# Patient Record
Sex: Male | Born: 1945 | Race: White | Hispanic: No | Marital: Married | Smoking: Never smoker
Health system: Southern US, Community
[De-identification: ages and names within clinical notes are randomized; demographics above are authoritative.]

## PROBLEM LIST (undated history)

## (undated) DIAGNOSIS — G589 Mononeuropathy, unspecified: Secondary | ICD-10-CM

## (undated) DIAGNOSIS — M1990 Unspecified osteoarthritis, unspecified site: Secondary | ICD-10-CM

## (undated) DIAGNOSIS — N183 Chronic kidney disease, stage 3 unspecified: Secondary | ICD-10-CM

## (undated) DIAGNOSIS — E119 Type 2 diabetes mellitus without complications: Secondary | ICD-10-CM

## (undated) DIAGNOSIS — N400 Enlarged prostate without lower urinary tract symptoms: Secondary | ICD-10-CM

## (undated) DIAGNOSIS — E785 Hyperlipidemia, unspecified: Secondary | ICD-10-CM

## (undated) DIAGNOSIS — S149XXA Injury of unspecified nerves of neck, initial encounter: Secondary | ICD-10-CM

## (undated) DIAGNOSIS — H269 Unspecified cataract: Secondary | ICD-10-CM

## (undated) DIAGNOSIS — K219 Gastro-esophageal reflux disease without esophagitis: Secondary | ICD-10-CM

## (undated) DIAGNOSIS — I2510 Atherosclerotic heart disease of native coronary artery without angina pectoris: Secondary | ICD-10-CM

## (undated) DIAGNOSIS — I10 Essential (primary) hypertension: Secondary | ICD-10-CM

## (undated) DIAGNOSIS — I4819 Other persistent atrial fibrillation: Secondary | ICD-10-CM

## (undated) DIAGNOSIS — D689 Coagulation defect, unspecified: Secondary | ICD-10-CM

## (undated) DIAGNOSIS — T7840XA Allergy, unspecified, initial encounter: Secondary | ICD-10-CM

## (undated) HISTORY — DX: Gastro-esophageal reflux disease without esophagitis: K21.9

## (undated) HISTORY — DX: Atherosclerotic heart disease of native coronary artery without angina pectoris: I25.10

## (undated) HISTORY — PX: CARDIAC CATHETERIZATION: SHX172

## (undated) HISTORY — DX: Other persistent atrial fibrillation: I48.19

## (undated) HISTORY — DX: Benign prostatic hyperplasia without lower urinary tract symptoms: N40.0

## (undated) HISTORY — DX: Type 2 diabetes mellitus without complications: E11.9

## (undated) HISTORY — DX: Allergy, unspecified, initial encounter: T78.40XA

## (undated) HISTORY — DX: Hyperlipidemia, unspecified: E78.5

## (undated) HISTORY — DX: Unspecified osteoarthritis, unspecified site: M19.90

## (undated) HISTORY — PX: TONSILLECTOMY: SUR1361

## (undated) HISTORY — DX: Unspecified cataract: H26.9

## (undated) HISTORY — DX: Chronic kidney disease, stage 3 unspecified: N18.30

## (undated) HISTORY — DX: Coagulation defect, unspecified: D68.9

## (undated) HISTORY — PX: VASECTOMY: SHX75

## (undated) HISTORY — PX: EYE SURGERY: SHX253

## (undated) HISTORY — PX: HERNIA REPAIR: SHX51

## (undated) HISTORY — DX: Chronic kidney disease, stage 3 (moderate): N18.3

---

## 2014-12-29 DIAGNOSIS — H9193 Unspecified hearing loss, bilateral: Secondary | ICD-10-CM | POA: Insufficient documentation

## 2016-05-31 DIAGNOSIS — M7542 Impingement syndrome of left shoulder: Secondary | ICD-10-CM | POA: Insufficient documentation

## 2016-08-16 ENCOUNTER — Encounter: Payer: Self-pay | Admitting: Gastroenterology

## 2016-08-16 ENCOUNTER — Emergency Department (HOSPITAL_COMMUNITY)
Admission: EM | Admit: 2016-08-16 | Discharge: 2016-08-16 | Disposition: A | Discharge status: Home / Self Care | Payer: BLUE CROSS/BLUE SHIELD | Attending: Emergency Medicine | Admitting: Emergency Medicine

## 2016-08-16 ENCOUNTER — Encounter (HOSPITAL_COMMUNITY): Payer: Self-pay | Admitting: Vascular Surgery

## 2016-08-16 DIAGNOSIS — I10 Essential (primary) hypertension: Secondary | ICD-10-CM | POA: Insufficient documentation

## 2016-08-16 DIAGNOSIS — E119 Type 2 diabetes mellitus without complications: Secondary | ICD-10-CM | POA: Insufficient documentation

## 2016-08-16 DIAGNOSIS — R066 Hiccough: Secondary | ICD-10-CM | POA: Insufficient documentation

## 2016-08-16 HISTORY — DX: Essential (primary) hypertension: I10

## 2016-08-16 HISTORY — DX: Injury of unspecified nerves of neck, initial encounter: S14.9XXA

## 2016-08-16 HISTORY — DX: Mononeuropathy, unspecified: G58.9

## 2016-08-16 LAB — I-STAT TROPONIN, ED: Troponin i, poc: 0 ng/mL (ref 0.00–0.08)

## 2016-08-16 MED ORDER — CHLORPROMAZINE HCL 25 MG/ML IJ SOLN
25.0000 mg | Freq: Once | INTRAMUSCULAR | Status: AC
  Administered 2016-08-16: 25 mg via INTRAMUSCULAR
  Filled 2016-08-16: qty 1

## 2016-08-16 MED ORDER — CHLORPROMAZINE HCL 25 MG PO TABS: 25.0000 mg | ORAL_TABLET | Freq: Three times a day (TID) | ORAL | 0 refills | Status: DC | PRN

## 2016-08-16 NOTE — ED Triage Notes (Signed)
Pt reports to the ED for eval of hiccups and indigestion since thanksgiving. He has been seen at an [facility] and an ED in [place]. He had a cardiac workup and was told everything was fine and tx for indigestion however, he reports that this has been getting worse. Pt reports he has an abdominal hernia.

## 2016-08-16 NOTE — ED Notes (Signed)
Declined W/C at D/C and was escorted to lobby by RN. 

## 2016-08-16 NOTE — ED Provider Notes (Signed)
[place] DEPT Provider Note   CSN: [id number] Arrival date & time: 08/16/16  1201  By signing my name below, I, Julien Nordmann, attest that this documentation has been prepared under the direction and in the presence of Lacretia Leigh, MD.  Electronically Signed: Julien Nordmann, ED Scribe. 08/16/16. 1:09 PM.    History   Chief Complaint Chief Complaint  Patient presents with  . Hiccups  . Gastroesophageal Reflux   The history is provided by the patient.   HPI Comments: Kenneth Watkins. is a 70 y.o. male who presents to the Emergency Department complaining of intermittent, gradual worsening indigestion x 7 days. He has been having associated hiccups and chest soreness secondary to the hiccups occuring. He further notes spitting up clear fluid due to the hiccups. Pt notes he is unable to sleep due to increased symptoms. There are no aggravating factors to his hiccups or indigestion. He was seen at an ED and UC in [place] two days ago for his symptoms and received a GI cocktail that numbed his throat for a few hours but his symptoms returned. Pt had a full workup done without and diagnosis. Pt also reports having an abdominal wall hernia without any pain. He denies abdominal pain, chest pain, chest pressure, nausea or vomiting.  Past Medical History:  Diagnosis Date  . Diabetes mellitus without complication ([place])   . Hypercholesteremia   . Hypertension   . Pinched nerve in neck     There are no active problems to display for this patient.   Past Surgical History:  Procedure Laterality Date  . EYE SURGERY    . TONSILLECTOMY    . VASECTOMY         Home Medications    Prior to Admission medications   Not on File    Family History No family history on file.  Social History Social History  Substance Use Topics  . Smoking status: Never Smoker  . Smokeless tobacco: Never Used  . Alcohol use No     Allergies   Patient has no allergy information on  record.   Review of Systems Review of Systems  Cardiovascular: Negative for chest pain.  Gastrointestinal: Negative for abdominal pain, nausea and vomiting.  All other systems reviewed and are negative.    Physical Exam Updated Vital Signs BP 153/90 (BP Location: Left Arm)   Pulse 90   Resp 18   Ht 6\' 1"  (1.854 m)   Wt 201 lb (91.2 kg)   SpO2 100%   BMI 26.52 kg/m   Physical Exam  Constitutional: He is oriented to person, place, and time. He appears well-developed and well-nourished.  Non-toxic appearance. No distress.  HENT:  Head: Normocephalic and atraumatic.  Eyes: Conjunctivae, EOM and lids are normal. Pupils are equal, round, and reactive to light.  Neck: Normal range of motion. Neck supple. No tracheal deviation present. No thyroid mass present.  Cardiovascular: Normal rate, regular rhythm and normal heart sounds.  Exam reveals no gallop.   No murmur heard. Pulmonary/Chest: Effort normal and breath sounds normal. No stridor. No respiratory distress. He has no decreased breath sounds. He has no wheezes. He has no rhonchi. He has no rales.  Abdominal: Soft. Normal appearance and bowel sounds are normal. He exhibits no distension. There is no tenderness. There is no rebound and no CVA tenderness.  Abdominal wall, slight midline hernia without any evidence of incarceration  Musculoskeletal: Normal range of motion. He exhibits no edema or tenderness.  Neurological: He is  alert and oriented to person, place, and time. He has normal strength. No cranial nerve deficit or sensory deficit. GCS eye subscore is 4. GCS verbal subscore is 5. GCS motor subscore is 6.  Skin: Skin is warm and dry. No abrasion and no rash noted.  Psychiatric: He has a normal mood and affect. His speech is normal and behavior is normal.  Nursing note and vitals reviewed.    ED Treatments / Results  DIAGNOSTIC STUDIES: Oxygen Saturation is 100% on RA, normal by my interpretation.  COORDINATION OF  CARE:  1:08 PM Discussed treatment plan with pt at bedside and pt agreed to plan.  Labs (all labs ordered are listed, but only abnormal results are displayed) [place], ED    EKG  EKG Interpretation None       Radiology No results found.  Procedures Procedures (including critical care time)  Medications Ordered in ED Medications - No data to display   Initial Impression / Assessment and Plan / ED Course  I have reviewed the triage vital signs and the nursing notes.  Pertinent labs & imaging results that were available during my care of the patient were reviewed by me and considered in my medical decision making (see chart for details).  Clinical Course     I personally performed the services described in this documentation, which was scribed in my presence. The recorded information has been reviewed and is accurate.   Patient given Thorazine and feels better. Begin prescription for same and follow-up with his doctor Final Clinical Impressions(s) / ED Diagnoses   Final diagnoses:  None    New Prescriptions New Prescriptions   No medications on file     Lacretia Leigh, MD 08/16/16 1414

## 2016-08-22 ENCOUNTER — Ambulatory Visit (INDEPENDENT_AMBULATORY_CARE_PROVIDER_SITE_OTHER): Payer: BLUE CROSS/BLUE SHIELD | Admitting: Gastroenterology

## 2016-08-22 ENCOUNTER — Encounter: Payer: Self-pay | Admitting: Gastroenterology

## 2016-08-22 VITALS — BP 128/88 | HR 77 | Ht 73.0 in | Wt 201.0 lb

## 2016-08-22 DIAGNOSIS — R066 Hiccough: Secondary | ICD-10-CM | POA: Insufficient documentation

## 2016-08-22 DIAGNOSIS — R142 Eructation: Secondary | ICD-10-CM | POA: Insufficient documentation

## 2016-08-22 NOTE — Progress Notes (Signed)
08/22/2016 Kenneth Watkins. [id number] 02-28-1946   HISTORY OF PRESENT ILLNESS:  This is a 70 year old male who is new to our office. He is here for ER follow-up, referred here by Dr. Zenia Resides.  The patient tells me that the day before Thanksgiving he had a steroid injection his neck. On Thanksgiving day he developed violent hiccups and belching to the point of vomiting. He was also having a lot of reflux/burning in his chest.  This lasted 8 days. He could not eat and could not sleep.  In that timeframe he went to an urgent care in [place], [facility] hospital in [place], and then finally came to the ER here in [place]. At the urgent care and [facility] he was treated with GI cocktail, which relieved his symptoms for a couple of hours but then symptoms recurred again. Finally he was here in the ER in [place] on November 30 at which time he was given Thorazine IV. This resolved his symptoms. He was also given a prescription which he picked up. He says that the hiccups started mildly again that evening and he took 1 dose of the oral Thorazine. Following that his symptoms have resolved and have not recurred. He is not having any residual symptoms. Prior to this episode he had never had any similar symptoms in the past. He denies even any real GI issues. He says occasionally he will have heartburn and reflux, but nothing on a regular basis. He did have a chest x-ray at [facility], which was negative.  He tells me that his last colonoscopy was in 2012 and was normal. He says that he was told he did not need another one for 10 years. That was at [facility] hospital.   Past Medical History:  Diagnosis Date  . Diabetes mellitus without complication ([place])   . Hypercholesteremia   . Hypertension   . Pinched nerve in neck    Past Surgical History:  Procedure Laterality Date  . EYE SURGERY    . TONSILLECTOMY    . VASECTOMY      reports that he has never smoked. He has never used smokeless  tobacco. He reports that he does not drink alcohol or use drugs. family history includes Diabetes in his mother; Uterine cancer in his mother. Allergies  Allergen Reactions  . Codeine Nausea And Vomiting    vomiting      Outpatient Encounter Prescriptions as of 08/22/2016  Medication Sig  . benazepril (LOTENSIN) 40 MG tablet Take 40 mg by mouth daily.  . chlorproMAZINE (THORAZINE) 25 MG tablet Take 1 tablet (25 mg total) by mouth 3 (three) times daily as needed. For hiccups  . chlorthalidone (HYGROTON) 25 MG tablet Take 25 mg by mouth daily.  Marland Kitchen diltiazem (DILACOR XR) 240 MG 24 hr capsule Take 240 mg by mouth daily.  . simvastatin (ZOCOR) 40 MG tablet Take 40 mg by mouth daily.  Marland Kitchen testosterone cypionate (DEPO-TESTOSTERONE) 200 MG/ML injection Inject into the muscle every 14 (fourteen) days.   No facility-administered encounter medications on file as of 08/22/2016.      REVIEW OF SYSTEMS  : All other systems reviewed and negative except where noted in the History of Present Illness.   PHYSICAL EXAM: BP 128/88   Pulse 77   Ht 6\' 1"  (1.854 m)   Wt 201 lb (91.2 kg)   SpO2 98%   BMI 26.52 kg/m  General: Well developed white male in no acute distress Head: Normocephalic and atraumatic Eyes:  Sclerae anicteric, conjunctiva pink. Ears: Normal auditory acuity Lungs: Clear throughout to auscultation Heart: Regular rate and rhythm Abdomen: Soft, non-distended.  Normal bowel sounds.  Non-tender. Musculoskeletal: Symmetrical with no gross deformities  Skin: No lesions on visible extremities Extremities: No edema  Neurological: Alert oriented x 4, grossly non-focal Psychological:  Alert and cooperative. Normal mood and affect  ASSESSMENT AND PLAN: -70 year old male with 8 day history of severe hiccups and belching.  Never had any history of the same or really any GI issues.  He thinks it was related to a steroid injection that he had in his neck.  This has now been resolved for 6 days  without any residual issues.  Chest x-ray was negative. I do not think that we need to go evaluating this any further at this point unless it were to recur or if he were to develop other related symptoms.  Will follow-up prn.   *Will get records from his colonoscopy, which he says was in 2012 and was normal.  Will enter him in appropriate recall.     CC:  No ref. provider found

## 2016-08-22 NOTE — Patient Instructions (Signed)
We will get your records from [facility] hospital.

## 2016-08-23 NOTE — Progress Notes (Signed)
I agree with the above note, plan 

## 2017-11-21 ENCOUNTER — Encounter: Payer: Self-pay | Admitting: Cardiology

## 2018-07-09 ENCOUNTER — Encounter: Payer: Self-pay | Admitting: Cardiology

## 2018-07-09 NOTE — Progress Notes (Signed)
Cardiology Office Note  Date: 07/10/2018   ID: Kenneth Watkins., DOB 1946-03-01, MRN [id number]  PCP: Lemmie Evens, MD  Consulting Cardiologist: Rozann Lesches, MD   Chief Complaint  Patient presents with  . Atrial Fibrillation    History of Present Illness: Kenneth Watkins. is a 72 y.o. male referred for cardiology consultation by Dr. Esmond Camper to establish follow-up of atrial fibrillation.  He was previously seen by Dr. Briant Sites in [facility].  I reviewed the available records.  He presents today reporting no significant palpitations or chest pain.  He does wear an Apple watch and has intermittent episodes of atrial fibrillation, although without significant RVR.  We discussed his history and presentation with atrial fibrillation back in January.  He ultimately did not require cardioversion as he spontaneously converted.  He is now following with Dr. Karie Kirks for PCP.  CHADSVASC score is 3.  He is tolerating Eliquis, reports no bleeding problems, no changes in stool.  Otherwise continues on diltiazem CD 240 mg daily.  I personally reviewed his tracing from March 2019 which showed sinus rhythm with PVC.  I also went over his echocardiogram report from March.  He is retired, previously worked for [employer] in [place].  Has also done woodworking over the years.  Past Medical History:  Diagnosis Date  . BPH (benign prostatic hyperplasia)   . CKD (chronic kidney disease) stage 3, GFR 30-59 ml/min (H[facility])   . Hyperlipidemia   . Hypertension   . Persistent atrial fibrillation   . Pinched nerve in neck   . Type 2 diabetes mellitus ([place])     Past Surgical History:  Procedure Laterality Date  . EYE SURGERY    . TONSILLECTOMY    . VASECTOMY      Current Outpatient Medications  Medication Sig Dispense Refill  . apixaban (ELIQUIS) 5 MG TABS tablet Take 5 mg by mouth 2 (two) times daily.    . benazepril (LOTENSIN) 20 MG tablet Take 20 mg by mouth daily.    .  chlorproMAZINE (THORAZINE) 25 MG tablet Take 1 tablet (25 mg total) by mouth 3 (three) times daily as needed. For hiccups 12 tablet 0  . chlorthalidone (HYGROTON) 25 MG tablet Take 25 mg by mouth daily.    Marland Kitchen diltiazem (DILACOR XR) 240 MG 24 hr capsule Take 240 mg by mouth daily.    . simvastatin (ZOCOR) 40 MG tablet Take 40 mg by mouth daily.    . tadalafil (ADCIRCA/CIALIS) 20 MG tablet Take 20 mg by mouth daily as needed for erectile dysfunction.    Marland Kitchen testosterone cypionate (DEPO-TESTOSTERONE) 200 MG/ML injection Inject into the muscle every 14 (fourteen) days.     No current facility-administered medications for this visit.    Allergies:  Codeine   Social History: The patient  reports that he has never smoked. He has never used smokeless tobacco. He reports that he does not drink alcohol or use drugs.   Family History: The patient's family history includes Diabetes in his mother; Uterine cancer in his mother.   ROS:  Please see the history of present illness. Otherwise, complete review of systems is positive for none.  All other systems are reviewed and negative.   Physical Exam: VS:  BP (!) 144/62   Pulse 72   Ht 6\' 1"  (1.854 m)   Wt 196 lb 6.4 oz (89.1 kg)   SpO2 98%   BMI 25.91 kg/m , BMI Body mass index is 25.91 kg/m.  Wt  Readings from Last 3 Encounters:  07/10/18 196 lb 6.4 oz (89.1 kg)  08/22/16 201 lb (91.2 kg)  08/16/16 201 lb (91.2 kg)    General: Patient appears comfortable at rest. HEENT: Conjunctiva and lids normal, oropharynx clear. Neck: Supple, no elevated JVP or carotid bruits, no thyromegaly. Lungs: Clear to auscultation, nonlabored breathing at rest. Cardiac: Regular rate and rhythm, no S3 or significant systolic murmur. Abdomen: Soft, nontender, bowel sounds present, no guarding or rebound. Extremities: No pitting edema, distal pulses 2+. Skin: Warm and dry. Musculoskeletal: No kyphosis. Neuropsychiatric: Alert and oriented x3, affect grossly  appropriate.  ECG: I personally reviewed the tracing from 08/16/2016 which showed normal sinus rhythm.  Recent Labwork:  January 2019: Hemoglobin 16, platelets 224, troponin I less than 0.30, BUN 17, creatinine 1.46, potassium 4.4, AST 44, ALT 33, cholesterol 147, triglycerides 228, HDL 28, LDL 87  Other Studies Reviewed Today:  Echocardiogram 11/21/2017 ([facility]): Mild LV septal hypertrophy with LVEF 60 to 65%, no regional wall motion abnormalities, ungraded diastolic dysfunction, normal left atrial chamber size, normal right ventricular contraction, trace mitral regurgitation, trace tricuspid regurgitation.  Assessment and Plan:  1.  Persistent atrial fibrillation diagnosed in January of this year, currently maintaining sinus rhythm with regular heart rate today and no palpitations.  He does have intermittent atrial fibrillation documented by his Apple watch, although no substantially elevated heart rates.  He does not feel palpitations during these times.  Plan is to continue Eliquis along with Cardizem CD for now unless we have difficulty controlling rates or he becomes more symptomatic.  Other options could be considered at that point.  Follow-up CBC and BMET for next visit.  2.  Essential hypertension, blood pressure control is adequate today.  Keep follow-up with PCP.  3.  Mixed hyperlipidemia, he is on Zocor with follow-up per PCP.  Lab work from January showed LDL 87.  4.  Type 2 diabetes mellitus, diet managed.  Keep follow-up with PCP.  Current medicines were reviewed with the patient today.   Orders Placed This Encounter  Procedures  . CBC  . Basic Metabolic Panel (BMET)    Disposition: Follow-up in 6 months.  Signed, Satira Sark, MD, [facility] 07/10/2018 1:52 PM     [facility] [address] [place], [place] [address] Phone: [phone]; Fax: [phone]

## 2018-07-10 ENCOUNTER — Encounter: Payer: Self-pay | Admitting: Cardiology

## 2018-07-10 ENCOUNTER — Ambulatory Visit (INDEPENDENT_AMBULATORY_CARE_PROVIDER_SITE_OTHER): Payer: Medicare Other | Admitting: Cardiology

## 2018-07-10 VITALS — BP 144/62 | HR 72 | Ht 73.0 in | Wt 196.4 lb

## 2018-07-10 DIAGNOSIS — E119 Type 2 diabetes mellitus without complications: Secondary | ICD-10-CM

## 2018-07-10 DIAGNOSIS — I10 Essential (primary) hypertension: Secondary | ICD-10-CM

## 2018-07-10 DIAGNOSIS — E782 Mixed hyperlipidemia: Secondary | ICD-10-CM | POA: Diagnosis not present

## 2018-07-10 DIAGNOSIS — I4819 Other persistent atrial fibrillation: Secondary | ICD-10-CM

## 2018-07-10 NOTE — Patient Instructions (Signed)
Medication Instructions:  Your physician recommends that you continue on your current medications as directed. Please refer to the Current Medication list given to you today.  If you need a refill on your cardiac medications before your next appointment, please call your pharmacy.   Lab work: NONE If you have labs (blood work) drawn today and your tests are completely normal, you will receive your results only by: Marland Kitchen MyChart Message (if you have MyChart) OR . A paper copy in the mail If you have any lab test that is abnormal or we need to change your treatment, we will call you to review the results.  Testing/Procedures: CBC and BMET JUST BEFORE NEXT VISIT  Follow-Up: At [facility], you and your health needs are our priority.  As part of our continuing mission to provide you with exceptional heart care, we have created designated Provider Care Teams.  These Care Teams include your primary Cardiologist (physician) and Advanced Practice Providers (APPs -  Physician Assistants and Nurse Practitioners) who all work together to provide you with the care you need, when you need it. You will need a follow up appointment in 6 months.  Please call our office 2 months in advance to schedule this appointment.  You may see Rozann Lesches, MD or one of the following Advanced Practice Providers on your designated Care Team:   Bernerd Pho, PA-C [facility]) . Ermalinda Barrios, PA-C ([place])  Any Other Special Instructions Will Be Listed Below (If Applicable). NONE

## 2018-09-23 ENCOUNTER — Ambulatory Visit (HOSPITAL_COMMUNITY)
Admission: RE | Admit: 2018-09-23 | Discharge: 2018-09-23 | Disposition: A | Discharge status: Home / Self Care | Payer: Medicare Other | Source: Ambulatory Visit | Attending: Nurse Practitioner | Admitting: Nurse Practitioner

## 2018-09-23 ENCOUNTER — Other Ambulatory Visit (HOSPITAL_COMMUNITY): Payer: Self-pay | Admitting: Otolaryngology

## 2018-09-23 ENCOUNTER — Other Ambulatory Visit (HOSPITAL_COMMUNITY): Payer: Self-pay | Admitting: Nurse Practitioner

## 2018-09-23 DIAGNOSIS — M545 Low back pain, unspecified: Secondary | ICD-10-CM

## 2018-09-23 DIAGNOSIS — M47817 Spondylosis without myelopathy or radiculopathy, lumbosacral region: Secondary | ICD-10-CM | POA: Diagnosis not present

## 2018-09-23 DIAGNOSIS — W19XXXD Unspecified fall, subsequent encounter: Secondary | ICD-10-CM

## 2018-09-25 ENCOUNTER — Telehealth: Payer: Self-pay | Admitting: Cardiology

## 2018-09-25 NOTE — Telephone Encounter (Signed)
I met him for the first time in October 2019 at which point he was tolerating Eliquis per my note.  If he has in fact been having recurring headaches, I would recommend a noncontrasted head CT just to make sure that there are no intracranial abnormalities of concern.  He could hold Eliquis in the meanwhile to see if his symptoms improve, although would be unprotected from the perspective of stroke prophylaxis during that time.  If his headaches improve and his head CT is negative, we could always consider switching him to something else such as Xarelto.

## 2018-09-25 NOTE — Telephone Encounter (Signed)
Patient would like return phone call to discuss side effects of Eliquis. States that he has been having headaches since taking it. / tg

## 2018-09-25 NOTE — Telephone Encounter (Signed)
Started Eliquis 09/2017, headache behind right started in August 2019  Saw pcp, given nasal spray for cluster headaches 4 days ago,can't say if it works yet or not and is very expensive,  tylenol relieves it, headaches happen 4-5 days for several weeks and disappears for months    Wife read that Eliquis may be the cause, they want your input

## 2018-10-01 NOTE — Telephone Encounter (Signed)
Patient declines CT, has apt 1/28 with neuro who will get either MRI or CT

## 2018-10-03 ENCOUNTER — Other Ambulatory Visit: Payer: Self-pay | Admitting: Nurse Practitioner

## 2018-10-03 ENCOUNTER — Other Ambulatory Visit (HOSPITAL_COMMUNITY): Payer: Self-pay | Admitting: Nurse Practitioner

## 2018-10-03 DIAGNOSIS — R51 Headache: Principal | ICD-10-CM

## 2018-10-03 DIAGNOSIS — R519 Headache, unspecified: Secondary | ICD-10-CM

## 2018-10-06 ENCOUNTER — Ambulatory Visit (HOSPITAL_COMMUNITY)
Admission: RE | Admit: 2018-10-06 | Discharge: 2018-10-06 | Disposition: A | Discharge status: Home / Self Care | Payer: Medicare Other | Source: Ambulatory Visit | Attending: Nurse Practitioner | Admitting: Nurse Practitioner

## 2018-10-06 DIAGNOSIS — R519 Headache, unspecified: Secondary | ICD-10-CM

## 2018-10-06 DIAGNOSIS — R51 Headache: Secondary | ICD-10-CM | POA: Diagnosis present

## 2018-10-06 LAB — POCT I-STAT CREATININE: Creatinine, Ser: 1.3 mg/dL — ABNORMAL HIGH (ref 0.61–1.24)

## 2018-10-06 MED ORDER — GADOBUTROL 1 MMOL/ML IV SOLN
7.0000 mL | Freq: Once | INTRAVENOUS | Status: AC | PRN
  Administered 2018-10-06: 7 mL via INTRAVENOUS

## 2019-01-09 ENCOUNTER — Ambulatory Visit: Payer: Medicare Other | Admitting: Cardiology

## 2019-01-27 ENCOUNTER — Telehealth: Payer: Self-pay | Admitting: Cardiology

## 2019-01-27 NOTE — Telephone Encounter (Signed)
Virtual Visit Pre-Appointment Phone Call  "(Name), I am calling you today to discuss your upcoming appointment. We are currently trying to limit exposure to the virus that causes COVID-19 by seeing patients at home rather than in the office."  1. "What is the BEST phone number to call the day of the visit?" - include this in appointment notes  2. Do you have or have access to (through a family member/friend) a smartphone with video capability that we can use for your visit?" a. If yes - list this number in appt notes as cell (if different from BEST phone #) and list the appointment type as a VIDEO visit in appointment notes b. If no - list the appointment type as a PHONE visit in appointment notes  3. Confirm consent - "In the setting of the current Covid19 crisis, you are scheduled for a (phone or video) visit with your provider on (date) at (time).  Just as we do with many in-office visits, in order for you to participate in this visit, we must obtain consent.  If you'd like, I can send this to your mychart (if signed up) or email for you to review.  Otherwise, I can obtain your verbal consent now.  All virtual visits are billed to your insurance company just like a normal visit would be.  By agreeing to a virtual visit, we'd like you to understand that the technology does not allow for your provider to perform an examination, and thus may limit your provider's ability to fully assess your condition. If your provider identifies any concerns that need to be evaluated in person, we will make arrangements to do so.  Finally, though the technology is pretty good, we cannot assure that it will always work on either your or our end, and in the setting of a video visit, we may have to convert it to a phone-only visit.  In either situation, we cannot ensure that we have a secure connection.  Are you willing to proceed?" STAFF: Did the patient verbally acknowledge consent to telehealth visit? Document  YES/NO here: Yes   4. Advise patient to be prepared - "Two hours prior to your appointment, go ahead and check your blood pressure, pulse, oxygen saturation, and your weight (if you have the equipment to check those) and write them all down. When your visit starts, your provider will ask you for this information. If you have an Apple Watch or Kardia device, please plan to have heart rate information ready on the day of your appointment. Please have a pen and paper handy nearby the day of the visit as well."  5. Give patient instructions for MyChart download to smartphone OR [email or web address] as below if video visit (depending on what platform provider is using)  6. Inform patient they will receive a phone call 15 minutes prior to their appointment time (may be from unknown caller ID) so they should be prepared to answer    [place]. has been deemed a candidate for a follow-up tele-health visit to limit community exposure during the Covid-19 pandemic. I spoke with the patient via phone to ensure availability of phone/video source, confirm preferred email & phone number, and discuss instructions and expectations.  I reminded Kenneth Watkins. to be prepared with any vital sign and/or heart rhythm information that could potentially be obtained via home monitoring, at the time of his visit. I reminded Kenneth Watkins. to expect a phone call prior  to his visit.  Kenneth Watkins 01/27/2019 11:34 AM

## 2019-01-29 ENCOUNTER — Other Ambulatory Visit: Payer: Self-pay

## 2019-01-29 ENCOUNTER — Other Ambulatory Visit (HOSPITAL_COMMUNITY)
Admission: RE | Admit: 2019-01-29 | Discharge: 2019-01-29 | Disposition: A | Discharge status: Home / Self Care | Payer: Medicare Other | Source: Ambulatory Visit | Attending: Cardiology | Admitting: Cardiology

## 2019-01-29 DIAGNOSIS — I4819 Other persistent atrial fibrillation: Secondary | ICD-10-CM | POA: Diagnosis present

## 2019-01-29 LAB — BASIC METABOLIC PANEL
Anion gap: 8 (ref 5–15)
BUN: 20 mg/dL (ref 8–23)
CO2: 29 mmol/L (ref 22–32)
Calcium: 9.5 mg/dL (ref 8.9–10.3)
Chloride: 98 mmol/L (ref 98–111)
Creatinine, Ser: 1.32 mg/dL — ABNORMAL HIGH (ref 0.61–1.24)
GFR calc Af Amer: >60 mL/min (ref >60)
GFR calc non Af Amer: 54 mL/min — ABNORMAL LOW (ref >60)
Glucose, Bld: 116 mg/dL — ABNORMAL HIGH (ref 70–99)
Potassium: 3.5 mmol/L (ref 3.5–5.1)
Sodium: 135 mmol/L (ref 135–145)

## 2019-01-29 LAB — CBC
HCT: 44.5 % (ref 39.0–52.0)
Hemoglobin: 14.6 g/dL (ref 13.0–17.0)
MCH: 30.2 pg (ref 26.0–34.0)
MCHC: 32.8 g/dL (ref 30.0–36.0)
MCV: 92.1 fL (ref 80.0–100.0)
Platelets: 242 10*3/uL (ref 150–400)
RBC: 4.83 MIL/uL (ref 4.22–5.81)
RDW: 12.8 % (ref 11.5–15.5)
WBC: 7.4 10*3/uL (ref 4.0–10.5)
nRBC: 0 % (ref 0.0–0.2)

## 2019-02-03 NOTE — Progress Notes (Signed)
Virtual Visit via Video Note   This visit type was conducted due to national recommendations for restrictions regarding the COVID-19 Pandemic (e.g. social distancing) in an effort to limit this patient's exposure and mitigate transmission in our community.  Due to his co-morbid illnesses, this patient is at least at moderate risk for complications without adequate follow up.  This format is felt to be most appropriate for this patient at this time.  All issues noted in this document were discussed and addressed.  A limited physical exam was performed with this format.  Please refer to the patient's chart for his consent to telehealth for [facility]   Date:  02/04/2019   ID:  Kenneth Watkins., DOB 21-Mar-1946, MRN [id number]  Patient Location: Home Provider Location: Office  PCP:  Lemmie Evens, MD  Cardiologist:  Rozann Lesches, MD  Evaluation Performed:  Follow-Up Visit  Chief Complaint:   Cardiac follow-up  History of Present Illness:    Kenneth Watkins. is a 73 y.o. male last seen in October 2019.  We communicated via video conferencing today.  Since last assessment he does not report any significant sense of palpitations, no dizziness or shortness of breath.  He has had 2 notifications on his Apple watch that he was in atrial fibrillation, but these were very brief.  Recent follow-up lab work on Eliquis is outlined below.  Hemoglobin is stable.  He does not report any obvious changes in stool.  Does bruise easily.  He was having some headaches earlier in the year, I reviewed the chart.  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  The patient does not have symptoms concerning for COVID-19 infection (fever, chills, cough, or new shortness of breath).  He has been social distancing.  Actually, his wife had a fall with injuries  several months ago and he has been taking care of her.    Past Medical History:  Diagnosis Date  . BPH (benign prostatic hyperplasia)   . CKD (chronic kidney disease) stage 3, GFR 30-59 ml/min (HCC)   . Hyperlipidemia   . Hypertension   . Persistent atrial fibrillation   . Pinched nerve in neck   . Type 2 diabetes mellitus ([place])    Past Surgical History:  Procedure Laterality Date  . EYE SURGERY    . TONSILLECTOMY    . VASECTOMY       Current Meds  Medication Sig  . apixaban (ELIQUIS) 5 MG TABS tablet Take 5 mg by mouth 2 (two) times daily.  . benazepril (LOTENSIN) 20 MG tablet Take 20 mg by mouth daily.  . chlorproMAZINE (THORAZINE) 25 MG tablet Take 1 tablet (25 mg total) by mouth 3 (three) times daily as needed. For hiccups  . chlorthalidone (HYGROTON) 25 MG tablet Take 25 mg by mouth daily.  Marland Kitchen diltiazem (DILACOR XR) 240 MG 24 hr capsule Take 240 mg by mouth daily.  . simvastatin (ZOCOR) 40 MG tablet Take 40 mg by mouth daily.  . tadalafil (ADCIRCA/CIALIS) 20 MG tablet Take 20 mg by mouth daily as needed for erectile dysfunction.  Marland Kitchen testosterone cypionate (DEPO-TESTOSTERONE) 200 MG/ML injection Inject into the muscle every 14 (fourteen) days.     Allergies:   Codeine   Social History   Tobacco Use  . Smoking status: Never Smoker  . Smokeless tobacco: Never Used  Substance Use Topics  . Alcohol use: No  . Drug use: No     Family Hx: The patient's family history includes Diabetes in his mother; Uterine cancer in his mother.  ROS:   Please see the history of present illness. All other systems reviewed and are negative.   Prior CV studies:   The following studies were reviewed today:  Echocardiogram 11/21/2017 ([place]): Mild LV septal hypertrophy with LVEF 60 to 65%, no regional wall motion abnormalities, ungraded diastolic dysfunction, normal left atrial chamber size, normal right ventricular contraction, trace mitral regurgitation, trace tricuspid  regurgitation.  Brain MRI 10/06/2018: FINDINGS: Brain: There is no evidence of acute infarct, mass, midline shift, or extra-axial fluid collection. A chronic microhemorrhage is noted in the parasagittal posterior right frontal lobe, nonspecific in isolation. Scattered punctate foci of T2 hyperintensity in the cerebral white matter bilaterally are nonspecific but compatible with minimal chronic small vessel ischemic disease, not greater than expected for age. No abnormal enhancement is identified.  Vascular: Major intracranial vascular flow voids are preserved.  Skull and upper cervical spine: No suspicious skull lesion.  Sinuses/Orbits: Bilateral cataract extraction. Mild right maxillary sinus mucosal thickening. Clear mastoid air cells.  Other: None.  IMPRESSION: No acute intracranial abnormality or cause of headaches identified. Largely unremarkable appearance of the brain for age.  Labs/Other Tests and Data Reviewed:    EKG:  An ECG dated 11/21/2017 was personally reviewed today and demonstrated:  Normal sinus rhythm with PVC and nonspecific ST changes.  Recent Labs: 01/29/2019: BUN 20; Creatinine, Ser 1.32; Hemoglobin 14.6; Platelets 242; Potassium 3.5; Sodium 135    Wt Readings from Last 3 Encounters:  02/04/19 192 lb 8 oz (87.3 kg)  07/10/18 196 lb 6.4 oz (89.1 kg)  08/22/16 201 lb (91.2 kg)     Objective:    Vital Signs:  BP 125/73   Pulse 71   Ht 6\' 1"  (1.854 m)   Wt 192 lb 8 oz (87.3 kg)   BMI 25.40 kg/m    General: Patient appears comfortable at rest, seated at his home. HEENT: Conjunctiva and lids normal. Lungs: Patient spoke in full sentences, not short of breath.  No audible wheezing. Skin: Normal appearance of color and turgor. Psychiatric: Gaze is conjugate.  Speech pattern normal.  Patient moves all extremities.  Affect appropriate.  ASSESSMENT & PLAN:    1.  Paroxysmal to persistent atrial fibrillation.  He is symptomatically stable and has  had only a few brief episodes in the last 6 months.  Plan to continue Eliquis and Cardizem CD.  Follow-up CBC and BMET for 54-month visit.  2.  Essential hypertension, blood pressure is well controlled today.  Keep follow-up with Dr. Karie Kirks.  3.  Mixed hyperlipidemia.  He continues on Zocor.  Keep follow-up with Dr. Karie Kirks.  COVID-19 Education: The signs and symptoms of COVID-19 were discussed with the patient and how to seek care for testing (follow up with PCP or arrange E-visit).  The importance of social distancing was discussed today.  Time:   Today, I have spent 12 minutes with the patient with telehealth technology discussing the above problems.     Medication Adjustments/Labs and Tests Ordered: Current medicines are reviewed at length with the patient today.  Concerns regarding medicines are outlined above.   Tests Ordered: No orders of the defined types were placed in this encounter.   Medication Changes: No orders of the defined types were placed in this encounter.   Disposition:  Follow up  6 months in the [place] office.  Signed, Rozann Lesches, MD  02/04/2019 12:40 PM    [place]

## 2019-02-04 ENCOUNTER — Encounter: Payer: Self-pay | Admitting: Cardiology

## 2019-02-04 ENCOUNTER — Telehealth (INDEPENDENT_AMBULATORY_CARE_PROVIDER_SITE_OTHER): Payer: Medicare Other | Admitting: Cardiology

## 2019-02-04 VITALS — BP 125/73 | HR 71 | Ht 73.0 in | Wt 192.5 lb

## 2019-02-04 DIAGNOSIS — I4819 Other persistent atrial fibrillation: Secondary | ICD-10-CM | POA: Diagnosis not present

## 2019-02-04 DIAGNOSIS — Z7189 Other specified counseling: Secondary | ICD-10-CM | POA: Diagnosis not present

## 2019-02-04 DIAGNOSIS — I10 Essential (primary) hypertension: Secondary | ICD-10-CM

## 2019-02-04 DIAGNOSIS — E782 Mixed hyperlipidemia: Secondary | ICD-10-CM | POA: Diagnosis not present

## 2019-02-04 NOTE — Patient Instructions (Addendum)
Medication Instructions:   Your physician recommends that you continue on your current medications as directed. Please refer to the Current Medication list given to you today.  Labwork:  Your physician recommends that you return for lab work in: 6 months just before your next visit to check your BMET & CBC. You will be contacted in about 4 months to remind you to have this done.  Testing/Procedures:  NONE  Follow-Up:  Your physician recommends that you schedule a follow-up appointment in: 6 months at the [place] office. You will receive a reminder letter in the mail in about 4 months reminding you to call and schedule your appointment. If you don't receive this letter, please contact our office.  Any Other Special Instructions Will Be Listed Below (If Applicable).  If you need a refill on your cardiac medications before your next appointment, please call your pharmacy.

## 2019-05-26 ENCOUNTER — Telehealth: Payer: Self-pay

## 2019-05-26 DIAGNOSIS — I4819 Other persistent atrial fibrillation: Secondary | ICD-10-CM

## 2019-05-26 NOTE — Telephone Encounter (Signed)
Faxed lab for cbc,bmet to [facility] lab

## 2019-08-06 ENCOUNTER — Other Ambulatory Visit (HOSPITAL_COMMUNITY)
Admission: RE | Admit: 2019-08-06 | Discharge: 2019-08-06 | Disposition: A | Discharge status: Home / Self Care | Payer: Medicare Other | Source: Ambulatory Visit | Attending: Cardiology | Admitting: Cardiology

## 2019-08-06 DIAGNOSIS — I4819 Other persistent atrial fibrillation: Secondary | ICD-10-CM | POA: Insufficient documentation

## 2019-08-06 LAB — BASIC METABOLIC PANEL
Anion gap: 12 (ref 5–15)
BUN: 17 mg/dL (ref 8–23)
CO2: 26 mmol/L (ref 22–32)
Calcium: 9.3 mg/dL (ref 8.9–10.3)
Chloride: 98 mmol/L (ref 98–111)
Creatinine, Ser: 1.32 mg/dL — ABNORMAL HIGH (ref 0.61–1.24)
GFR calc Af Amer: >60 mL/min (ref >60)
GFR calc non Af Amer: 54 mL/min — ABNORMAL LOW (ref >60)
Glucose, Bld: 149 mg/dL — ABNORMAL HIGH (ref 70–99)
Potassium: 3.3 mmol/L — ABNORMAL LOW (ref 3.5–5.1)
Sodium: 136 mmol/L (ref 135–145)

## 2019-08-06 LAB — CBC
HCT: 50.9 % (ref 39.0–52.0)
Hemoglobin: 17.2 g/dL — ABNORMAL HIGH (ref 13.0–17.0)
MCH: 30.3 pg (ref 26.0–34.0)
MCHC: 33.8 g/dL (ref 30.0–36.0)
MCV: 89.6 fL (ref 80.0–100.0)
Platelets: 204 10*3/uL (ref 150–400)
RBC: 5.68 MIL/uL (ref 4.22–5.81)
RDW: 12.5 % (ref 11.5–15.5)
WBC: 7.2 10*3/uL (ref 4.0–10.5)
nRBC: 0 % (ref 0.0–0.2)

## 2019-08-10 ENCOUNTER — Ambulatory Visit (INDEPENDENT_AMBULATORY_CARE_PROVIDER_SITE_OTHER): Payer: Medicare Other | Admitting: Cardiology

## 2019-08-10 ENCOUNTER — Other Ambulatory Visit: Payer: Self-pay

## 2019-08-10 ENCOUNTER — Encounter: Payer: Self-pay | Admitting: Cardiology

## 2019-08-10 VITALS — BP 137/73 | HR 73 | Temp 97.5°F | Ht 73.0 in | Wt 199.0 lb

## 2019-08-10 DIAGNOSIS — I10 Essential (primary) hypertension: Secondary | ICD-10-CM

## 2019-08-10 DIAGNOSIS — I4819 Other persistent atrial fibrillation: Secondary | ICD-10-CM | POA: Diagnosis not present

## 2019-08-10 NOTE — Progress Notes (Signed)
Cardiology Office Note  Date: 08/10/2019   ID: Kenneth Soliman., DOB 1946-04-01, MRN [id number]  PCP:  Lemmie Evens, MD  Cardiologist:  Rozann Lesches, MD Electrophysiologist:  None   Chief Complaint  Patient presents with  . Cardiac follow-up    History of Present Illness: Kenneth Genaw. is a 72 y.o. male last assessed via telehealth encounter in May.  He presents for a routine visit.  States that he has been doing well overall, playing 3 or 4 holes of golf a few days a week.  He does not report any progressive sense of palpitations, these are typically brief.  No associated chest pain with activity.  I reviewed his medications which are outlined below.  He reports compliance and no obvious intolerances.  No bleeding problems on Eliquis.  I reviewed his recent lab work as outlined below.  Past Medical History:  Diagnosis Date  . BPH (benign prostatic hyperplasia)   . CKD (chronic kidney disease) stage 3, GFR 30-59 ml/min   . Hyperlipidemia   . Hypertension   . Persistent atrial fibrillation ([place])   . Pinched nerve in neck   . Type 2 diabetes mellitus ([place])     Past Surgical History:  Procedure Laterality Date  . EYE SURGERY    . TONSILLECTOMY    . VASECTOMY      Current Outpatient Medications  Medication Sig Dispense Refill  . apixaban (ELIQUIS) 5 MG TABS tablet Take 5 mg by mouth 2 (two) times daily.    . benazepril (LOTENSIN) 20 MG tablet Take 20 mg by mouth daily.    . chlorproMAZINE (THORAZINE) 25 MG tablet Take 1 tablet (25 mg total) by mouth 3 (three) times daily as needed. For hiccups 12 tablet 0  . chlorthalidone (HYGROTON) 25 MG tablet Take 25 mg by mouth daily.    Marland Kitchen diltiazem (DILACOR XR) 240 MG 24 hr capsule Take 240 mg by mouth daily.    . simvastatin (ZOCOR) 40 MG tablet Take 40 mg by mouth daily.    . tadalafil (ADCIRCA/CIALIS) 20 MG tablet Take 20 mg by mouth daily as needed for erectile dysfunction.    Marland Kitchen testosterone cypionate  (DEPO-TESTOSTERONE) 200 MG/ML injection Inject into the muscle every 14 (fourteen) days.     No current facility-administered medications for this visit.    Allergies:  Codeine   Social History: The patient  reports that he has never smoked. He has never used smokeless tobacco. He reports that he does not drink alcohol or use drugs.   ROS:  Please see the history of present illness. Otherwise, complete review of systems is positive for none.  All other systems are reviewed and negative.   Physical Exam: VS:  BP 137/73   Pulse 73   Temp (!) 97.5 F (36.4 C)   Ht 6\' 1"  (1.854 m)   Wt 199 lb (90.3 kg)   SpO2 98%   BMI 26.25 kg/m , BMI Body mass index is 26.25 kg/m.  Wt Readings from Last 3 Encounters:  08/10/19 199 lb (90.3 kg)  02/04/19 192 lb 8 oz (87.3 kg)  07/10/18 196 lb 6.4 oz (89.1 kg)    General: Patient appears comfortable at rest. HEENT: Conjunctiva and lids normal, wearing a mask. Neck: Supple, no elevated JVP or carotid bruits, no thyromegaly. Lungs: Clear to auscultation, nonlabored breathing at rest. Cardiac: Regular rate and rhythm, no S3 or significant systolic murmur, no pericardial rub. Abdomen: Soft, nontender, bowel sounds present. Extremities: No pitting  edema, distal pulses 2+.  ECG:  An ECG dated 11/21/2017 was personally reviewed today and demonstrated:  Normal sinus rhythm with PVC and nonspecific ST changes.  Recent Labwork: 08/06/2019: BUN 17; Creatinine, Ser 1.32; Hemoglobin 17.2; Platelets 204; Potassium 3.3; Sodium 136   Other Studies Reviewed Today:  Echocardiogram 11/21/2017 ([place]): Mild LV septal hypertrophy with LVEF 60 to 65%, no regional wall motion abnormalities, ungraded diastolic dysfunction, normal left atrial chamber size, normal right ventricular contraction, trace mitral regurgitation, trace tricuspid regurgitation.  Brain MRI 10/06/2018: FINDINGS: Brain: There is no evidence of acute infarct, mass, midline shift, or  extra-axial fluid collection. A chronic microhemorrhage is noted in the parasagittal posterior right frontal lobe, nonspecific in isolation. Scattered punctate foci of T2 hyperintensity in the cerebral white matter bilaterally are nonspecific but compatible with minimal chronic small vessel ischemic disease, not greater than expected for age. No abnormal enhancement is identified.  Vascular: Major intracranial vascular flow voids are preserved.  Skull and upper cervical spine: No suspicious skull lesion.  Sinuses/Orbits: Bilateral cataract extraction. Mild right maxillary sinus mucosal thickening. Clear mastoid air cells.  Other: None.  IMPRESSION: No acute intracranial abnormality or cause of headaches identified. Largely unremarkable appearance of the brain for age.  Assessment and Plan:  1.  Persistent atrial fibrillation by history, doing well on current medical therapy including Cardizem CD and Eliquis.  He reports only brief occasional palpitations, no functional limitation at this time.  Continue with current regimen.  Recent lab work reviewed.  2.  Essential hypertension, systolic in the [id number] today.  Medication Adjustments/Labs and Tests Ordered: Current medicines are reviewed at length with the patient today.  Concerns regarding medicines are outlined above.   Tests Ordered: No orders of the defined types were placed in this encounter.   Medication Changes: No orders of the defined types were placed in this encounter.   Disposition:  Follow up 6 months in the [place] office.  Signed, Satira Sark, MD, [facility] 08/10/2019 2:39 PM    [place] at [facility] [address] [place], [place] [address] Phone: [phone]; Fax: [phone]

## 2019-08-10 NOTE — Patient Instructions (Signed)
Medication Instructions:  Your physician recommends that you continue on your current medications as directed. Please refer to the Current Medication list given to you today.  *If you need a refill on your cardiac medications before your next appointment, please call your pharmacy*  Lab Work: None today If you have labs (blood work) drawn today and your tests are completely normal, you will receive your results only by: . MyChart Message (if you have MyChart) OR . A paper copy in the mail If you have any lab test that is abnormal or we need to change your treatment, we will call you to review the results.  Testing/Procedures: None today  Follow-Up: At [facility], you and your health needs are our priority.  As part of our continuing mission to provide you with exceptional heart care, we have created designated Provider Care Teams.  These Care Teams include your primary Cardiologist (physician) and Advanced Practice Providers (APPs -  Physician Assistants and Nurse Practitioners) who all work together to provide you with the care you need, when you need it.  Your next appointment:   6 months  The format for your next appointment:   In Person  Provider:   Samuel McDowell, MD  Other Instructions None     Thank you for choosing [facility] !         

## 2019-08-18 IMAGING — DX DG LUMBAR SPINE COMPLETE 4+V
5 series · 5 of 5 positions shown · non-contrast
Comparison: None.

CLINICAL DATA: Low back pain after fall, subsequent encounter. Fall
09/05/2018. Patient reports falling onto a leaf blower.

EXAM:
LUMBAR SPINE - COMPLETE 4+ VIEW

[l-spine ap]
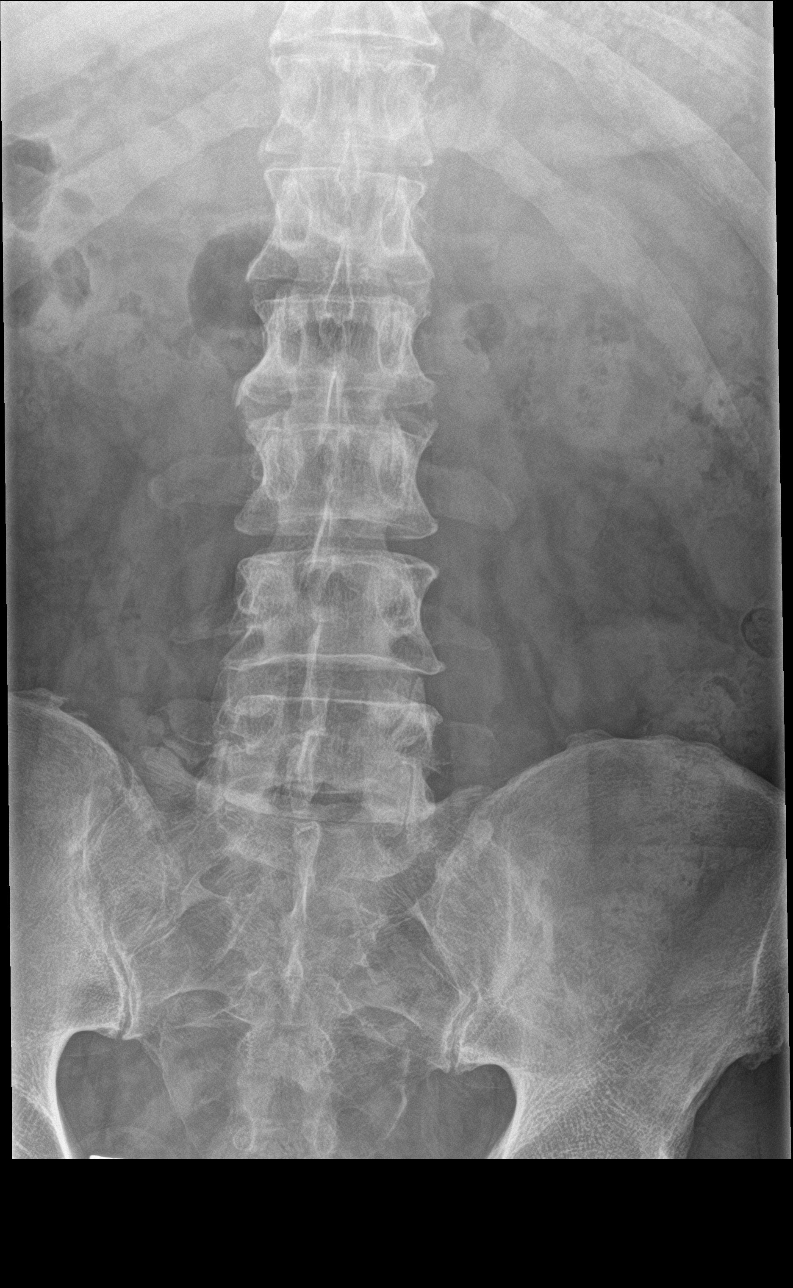

[l-spine obl (1 of 2)]
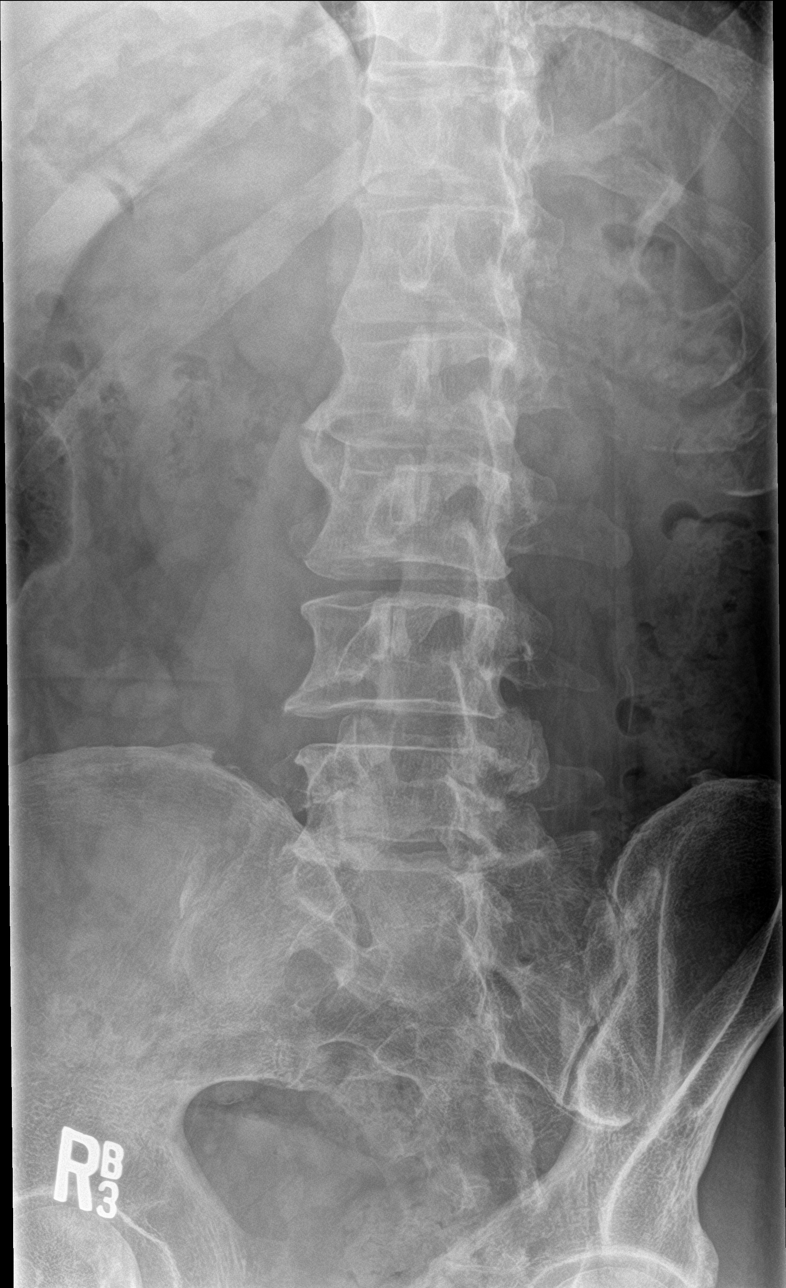

[l-spine obl (2 of 2)]
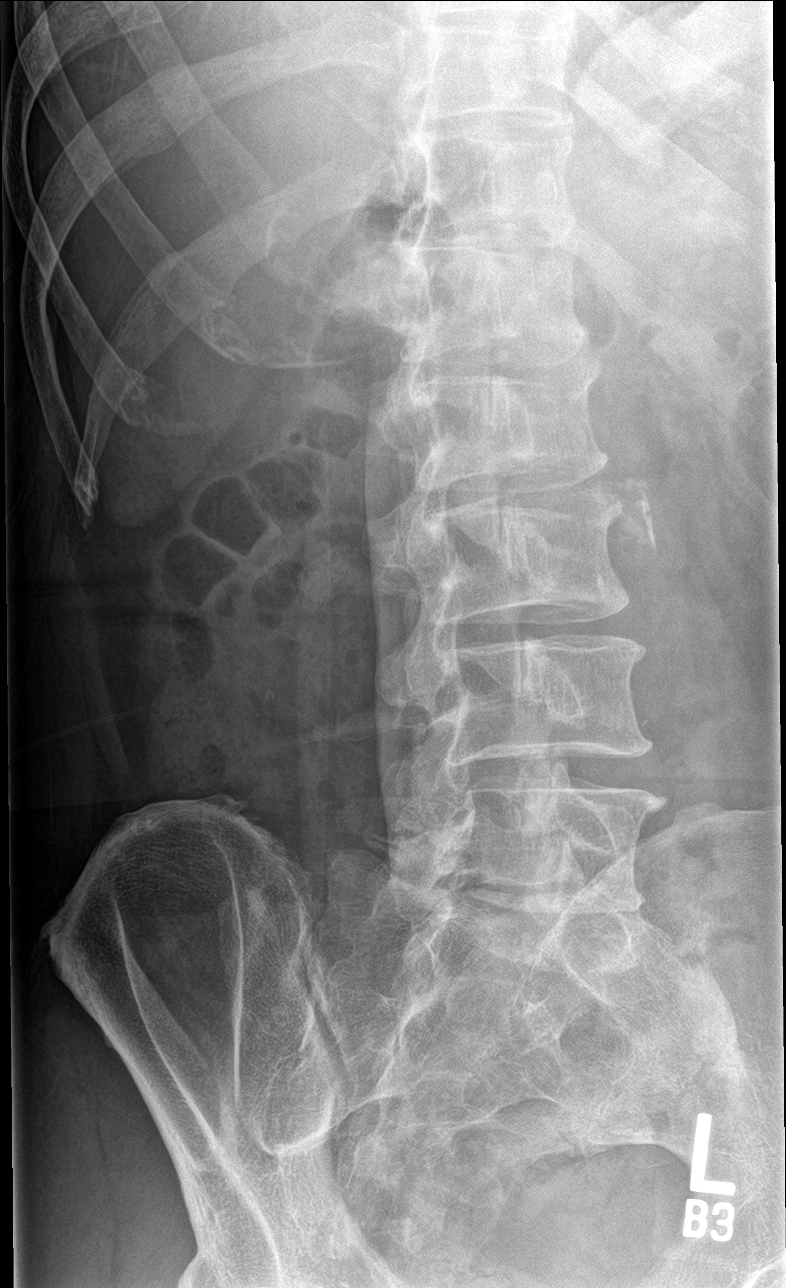

[l-spine lat]
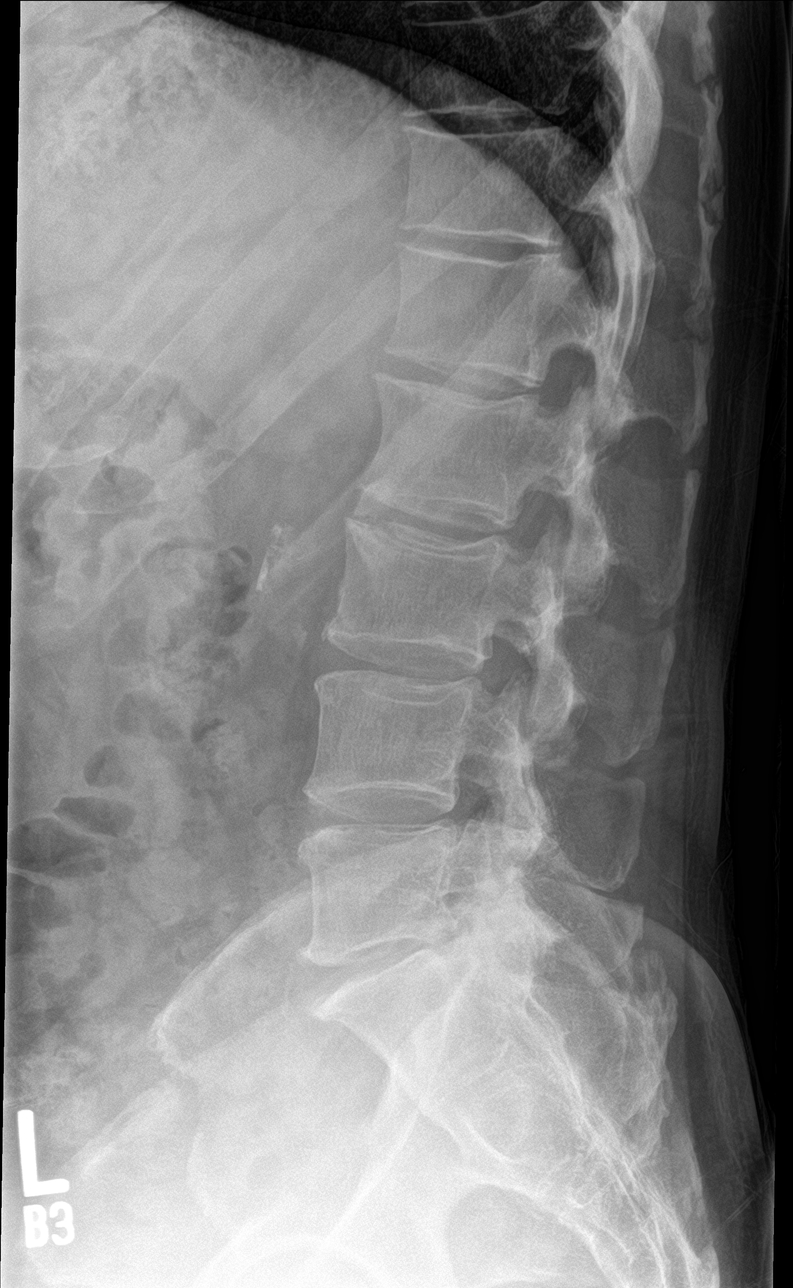

[l-spine spot]
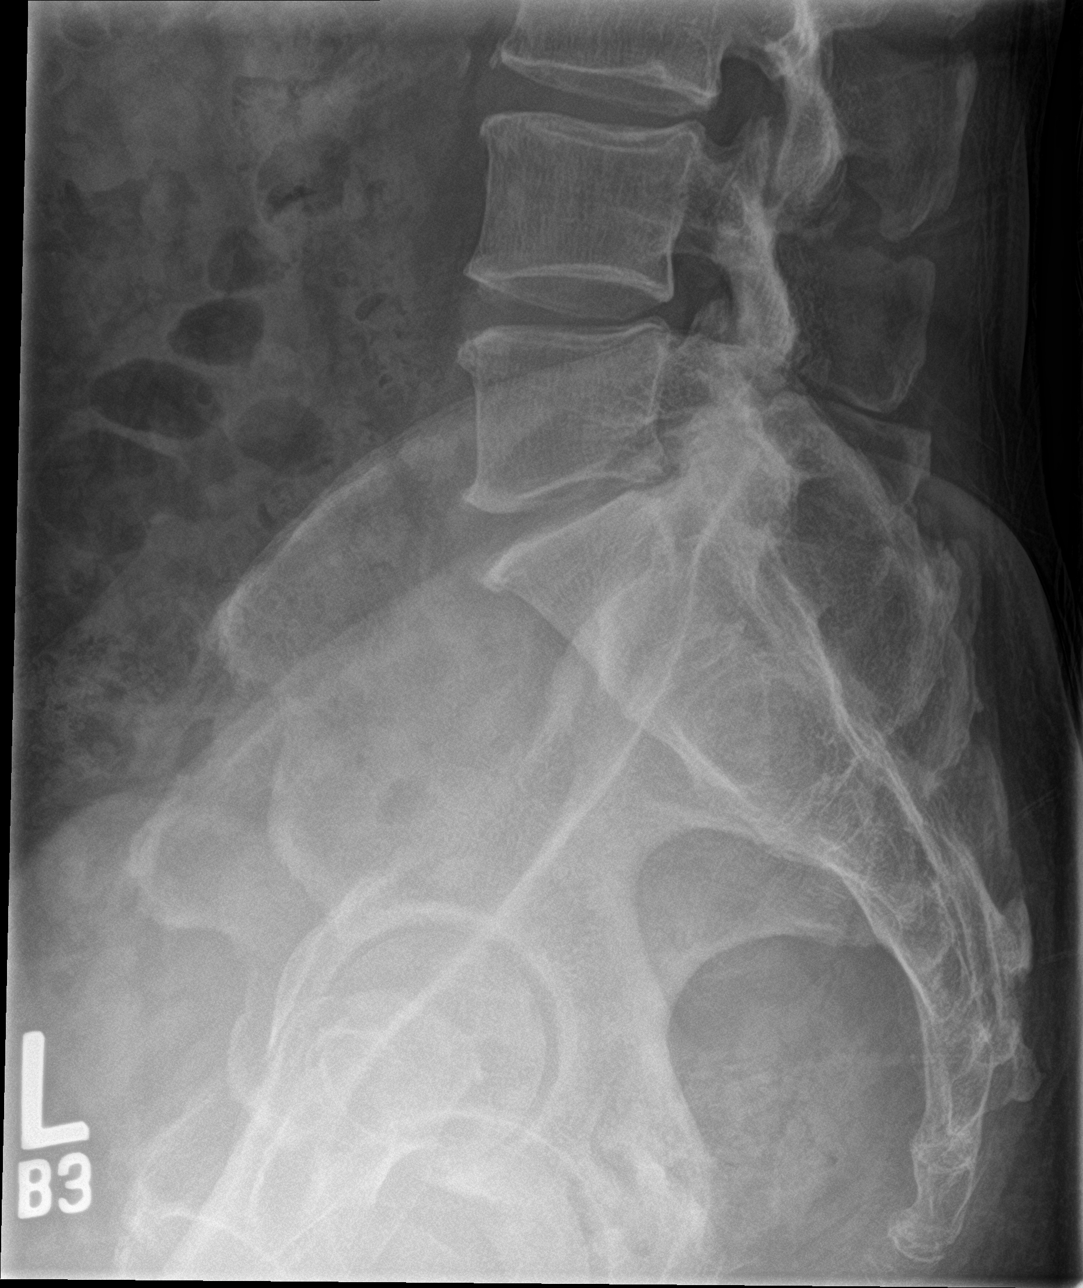

[5 of 5 positions shown; findings below may reference images not displayed]

FINDINGS: The alignment is maintained. Vertebral body heights are normal.
There is no listhesis. The posterior elements are intact. Mild disc
space narrowing at L5-S1, and L2-L3. Multilevel endplate spurring.
Facet arthropathy at L5-S1 and to a lesser extent L4-L5.. No
fracture. Sacroiliac joints are congruent.
IMPRESSION: Degenerative change in the lumbar spine without acute fracture.

## 2020-02-04 ENCOUNTER — Telehealth: Payer: Self-pay | Admitting: Cardiology

## 2020-02-04 NOTE — Telephone Encounter (Signed)
Pt aware that he doesn't need labs prior to 5/24 appt per LOV

## 2020-02-04 NOTE — Telephone Encounter (Signed)
Pt left voicemail wanting to know if he's supposed to have lab work done prior to his up comping apt   Please give pt a call [phone]

## 2020-02-08 ENCOUNTER — Ambulatory Visit (INDEPENDENT_AMBULATORY_CARE_PROVIDER_SITE_OTHER): Payer: Medicare Other | Admitting: Cardiology

## 2020-02-08 ENCOUNTER — Other Ambulatory Visit: Payer: Self-pay

## 2020-02-08 ENCOUNTER — Encounter: Payer: Self-pay | Admitting: Cardiology

## 2020-02-08 VITALS — BP 142/80 | HR 68 | Ht 73.0 in | Wt 197.0 lb

## 2020-02-08 DIAGNOSIS — I10 Essential (primary) hypertension: Secondary | ICD-10-CM | POA: Diagnosis not present

## 2020-02-08 DIAGNOSIS — R0600 Dyspnea, unspecified: Secondary | ICD-10-CM | POA: Diagnosis not present

## 2020-02-08 DIAGNOSIS — I4819 Other persistent atrial fibrillation: Secondary | ICD-10-CM

## 2020-02-08 DIAGNOSIS — R0609 Other forms of dyspnea: Secondary | ICD-10-CM

## 2020-02-08 NOTE — Patient Instructions (Addendum)
Medication Instructions:  Your physician recommends that you continue on your current medications as directed. Please refer to the Current Medication list given to you today.  *If you need a refill on your cardiac medications before your next appointment, please call your pharmacy*   Lab Work: BMET and CBC  If you have labs (blood work) drawn today and your tests are completely normal, you will receive your results only by: Marland Kitchen MyChart Message (if you have MyChart) OR . A paper copy in the mail If you have any lab test that is abnormal or we need to change your treatment, we will call you to review the results.   Testing/Procedures: Your physician has requested that you have a lexiscan myoview. For further information please visit [email or web address]. Please follow instruction sheet, as given.  Follow-Up: At [facility], you and your health needs are our priority.  As part of our continuing mission to provide you with exceptional heart care, we have created designated Provider Care Teams.  These Care Teams include your primary Cardiologist (physician) and Advanced Practice Providers (APPs -  Physician Assistants and Nurse Practitioners) who all work together to provide you with the care you need, when you need it.  We recommend signing up for the patient portal called "MyChart".  Sign up information is provided on this After Visit Summary.  MyChart is used to connect with patients for Virtual Visits (Telemedicine).  Patients are able to view lab/test results, encounter notes, upcoming appointments, etc.  Non-urgent messages can be sent to your provider as well.   To learn more about what you can do with MyChart, go to [email or web address].    Your next appointment:  Will follow up with you after your stress test   Other Instructions

## 2020-02-08 NOTE — Progress Notes (Signed)
Cardiology Office Note  Date: 02/08/2020   ID: Kenneth Orrick., DOB [birth year], MRN [id number]  PCP:  Lemmie Evens, MD  Cardiologist:  Rozann Lesches, MD Electrophysiologist:  None   Chief Complaint  Patient presents with  . Cardiac follow-up    History of Present Illness: Kenneth Tierrablanca. is a 74 y.o. male last seen in November 2020.  He presents for a routine visit.  Since last evaluation he reports intermittent episodes of atrial fibrillation, he experiences typically brief palpitations over a period of several minutes in the early morning or after he gets up.  He has caught this with his Apple watch on occasion.  During the daytime he is less bothered by palpitations, has been walking and playing golf, has worked up to 9 holes at a time and generally feels like his stamina has improved.  With higher level activity however, in particular with sexual intercourse, he has noticed worsening dyspnea on exertion and diaphoresis.  I reviewed his medications which are outlined below.  He reports compliance and no change in cardiac regimen.  No reported bleeding episodes on Eliquis.  He is due for follow-up lab work.  I personally reviewed his ECG today which shows sinus rhythm with prolonged PR interval.  Past Medical History:  Diagnosis Date  . BPH (benign prostatic hyperplasia)   . CKD (chronic kidney disease) stage 3, GFR 30-59 ml/min   . Hyperlipidemia   . Hypertension   . Persistent atrial fibrillation ([place])   . Pinched nerve in neck   . Type 2 diabetes mellitus ([place])     Past Surgical History:  Procedure Laterality Date  . EYE SURGERY    . TONSILLECTOMY    . VASECTOMY      Current Outpatient Medications  Medication Sig Dispense Refill  . Accu-Chek Softclix Lancets lancets 1 each by Other route as needed.    Marland Kitchen apixaban (ELIQUIS) 5 MG TABS tablet Take 5 mg by mouth 2 (two) times daily.    . B-D 3CC LUER-LOK SYR 22GX1" 22G X 1" 3 ML MISC 1 each by Other route as  needed.    . BD DISP NEEDLES 18G X 1-1/2" MISC 1 each by Other route as needed.    . benazepril (LOTENSIN) 20 MG tablet Take 20 mg by mouth daily.    . chlorproMAZINE (THORAZINE) 25 MG tablet Take 1 tablet (25 mg total) by mouth 3 (three) times daily as needed. For hiccups 12 tablet 0  . chlorthalidone (HYGROTON) 25 MG tablet Take 25 mg by mouth daily.    . clotrimazole-betamethasone (LOTRISONE) cream Apply 1 application topically as needed.    . CONTOUR NEXT TEST test strip 1 each by Other route daily.    Marland Kitchen diltiazem (DILACOR XR) 240 MG 24 hr capsule Take 240 mg by mouth daily.    . methocarbamol (ROBAXIN) 500 MG tablet Take 1 tablet by mouth as needed.    . simvastatin (ZOCOR) 40 MG tablet Take 40 mg by mouth daily.    . tadalafil (ADCIRCA/CIALIS) 20 MG tablet Take 20 mg by mouth daily as needed for erectile dysfunction.    Marland Kitchen testosterone cypionate (DEPO-TESTOSTERONE) 200 MG/ML injection Inject into the muscle every 14 (fourteen) days.     No current facility-administered medications for this visit.   Allergies:  Codeine   ROS:   No syncope.  Physical Exam: VS:  BP (!) 142/80   Pulse 68   Ht 6\' 1"  (1.854 m)   Wt 197 lb (  89.4 kg)   SpO2 97%   BMI 25.99 kg/m , BMI Body mass index is 25.99 kg/m.  Wt Readings from Last 3 Encounters:  02/08/20 197 lb (89.4 kg)  08/10/19 199 lb (90.3 kg)  02/04/19 192 lb 8 oz (87.3 kg)    General: Patient appears comfortable at rest. HEENT: Conjunctiva and lids normal, wearing a mask. Neck: Supple, no elevated JVP or carotid bruits, no thyromegaly. Lungs: Clear to auscultation, nonlabored breathing at rest. Cardiac: Regular rate and rhythm, no S3 or significant systolic murmur, no pericardial rub. Abdomen: Soft, bowel sounds present, no guarding or rebound. Extremities: No pitting edema, distal pulses 2+.  ECG:  An ECG dated 11/21/2017 was personally reviewed today and demonstrated:  Normal sinus rhythm with PVC and nonspecific ST  changes.  Recent Labwork: 08/06/2019: BUN 17; Creatinine, Ser 1.32; Hemoglobin 17.2; Platelets 204; Potassium 3.3; Sodium 136   Other Studies Reviewed Today:  Echocardiogram 11/21/2017 ([place]): Mild LV septal hypertrophy with LVEF 60 to 65%, no regional wall motion abnormalities, ungraded diastolic dysfunction, normal left atrial chamber size, normal right ventricular contraction, trace mitral regurgitation, trace tricuspid regurgitation.  Assessment and Plan:  1.  Dyspnea on exertion as outlined above.  He does have a history of paroxysmal atrial fibrillation, but not necessarily palpitations with his exertional breathlessness.  ECG shows that he is in sinus rhythm today with prolonged PR interval.  He does not have any history of ischemic heart disease, but has not undergone any recent evaluations. Family history includes premature CAD in his father, he also has a personal history of type 2 diabetes mellitus and hyperlipidemia.  We will proceed with a Lexiscan Myoview.  2.  Paroxysmal to persistent atrial fibrillation, CHA2DS2-VASc score is at least 3.  Continue Eliquis for stroke prophylaxis.  Check CBC and BMET.  Medication Adjustments/Labs and Tests Ordered: Current medicines are reviewed at length with the patient today.  Concerns regarding medicines are outlined above.   Tests Ordered: Orders Placed This Encounter  Procedures  . NM Myocar Single W/Spect W/Wall Motion And EF  . CBC  . Basic Metabolic Panel (BMET)  . EKG 12-Lead    Medication Changes: No orders of the defined types were placed in this encounter.   Disposition:  Follow up test results and determine disposition.  Signed, Satira Sark, MD, [facility] 02/08/2020 2:25 PM    [facility] at [facility] [address] [place], [place] [address] Phone: [phone]; Fax: [phone]

## 2020-02-11 LAB — BASIC METABOLIC PANEL
BUN/Creatinine Ratio: 13 (calc) (ref 6–22)
BUN: 17 mg/dL (ref 7–25)
CO2: 32 mmol/L (ref 20–32)
Calcium: 10.2 mg/dL (ref 8.6–10.3)
Chloride: 97 mmol/L — ABNORMAL LOW (ref 98–110)
Creat: 1.28 mg/dL — ABNORMAL HIGH (ref 0.70–1.18)
Glucose, Bld: 150 mg/dL — ABNORMAL HIGH (ref 65–99)
Potassium: 3.8 mmol/L (ref 3.5–5.3)
Sodium: 136 mmol/L (ref 135–146)

## 2020-02-11 LAB — CBC
HCT: 52.4 % — ABNORMAL HIGH (ref 38.5–50.0)
Hemoglobin: 17.6 g/dL — ABNORMAL HIGH (ref 13.2–17.1)
MCH: 30.6 pg (ref 27.0–33.0)
MCHC: 33.6 g/dL (ref 32.0–36.0)
MCV: 91.1 fL (ref 80.0–100.0)
MPV: 9.4 fL (ref 7.5–12.5)
Platelets: 244 10*3/uL (ref 140–400)
RBC: 5.75 10*6/uL (ref 4.20–5.80)
RDW: 12.1 % (ref 11.0–15.0)
WBC: 7.3 10*3/uL (ref 3.8–10.8)

## 2020-02-12 ENCOUNTER — Encounter (HOSPITAL_BASED_OUTPATIENT_CLINIC_OR_DEPARTMENT_OTHER)
Admission: RE | Admit: 2020-02-12 | Discharge: 2020-02-12 | Disposition: A | Discharge status: Home / Self Care | Payer: Medicare Other | Source: Ambulatory Visit | Attending: Cardiology | Admitting: Cardiology

## 2020-02-12 ENCOUNTER — Other Ambulatory Visit: Payer: Self-pay

## 2020-02-12 ENCOUNTER — Encounter (HOSPITAL_COMMUNITY): Payer: Self-pay

## 2020-02-12 ENCOUNTER — Encounter (HOSPITAL_COMMUNITY)
Admission: RE | Admit: 2020-02-12 | Discharge: 2020-02-12 | Disposition: A | Discharge status: Home / Self Care | Payer: Medicare Other | Source: Ambulatory Visit | Attending: Cardiology | Admitting: Cardiology

## 2020-02-12 DIAGNOSIS — R0609 Other forms of dyspnea: Secondary | ICD-10-CM

## 2020-02-12 DIAGNOSIS — I10 Essential (primary) hypertension: Secondary | ICD-10-CM

## 2020-02-12 DIAGNOSIS — I4819 Other persistent atrial fibrillation: Secondary | ICD-10-CM | POA: Diagnosis not present

## 2020-02-12 DIAGNOSIS — R0600 Dyspnea, unspecified: Secondary | ICD-10-CM | POA: Diagnosis not present

## 2020-02-12 LAB — NM MYOCAR MULTI W/SPECT W/WALL MOTION / EF
LV dias vol: 118 mL (ref 62–150)
LV sys vol: 50 mL
Peak HR: 84 {beats}/min
RATE: 0.33
Rest HR: 76 {beats}/min
SDS: 0
SRS: 4
SSS: 4
TID: 1.09

## 2020-02-12 MED ORDER — REGADENOSON 0.4 MG/5ML IV SOLN
INTRAVENOUS | Status: AC
  Administered 2020-02-12: 0.4 mg via INTRAVENOUS
  Filled 2020-02-12: qty 5

## 2020-02-12 MED ORDER — SODIUM CHLORIDE FLUSH 0.9 % IV SOLN
INTRAVENOUS | Status: AC
  Administered 2020-02-12: 10 mL via INTRAVENOUS
  Filled 2020-02-12: qty 10

## 2020-02-12 MED ORDER — TECHNETIUM TC 99M TETROFOSMIN IV KIT
10.0000 | PACK | Freq: Once | INTRAVENOUS | Status: AC | PRN
  Administered 2020-02-12: 10.8 via INTRAVENOUS

## 2020-02-12 MED ORDER — TECHNETIUM TC 99M TETROFOSMIN IV KIT
30.0000 | PACK | Freq: Once | INTRAVENOUS | Status: AC | PRN
  Administered 2020-02-12: 31.6 via INTRAVENOUS

## 2020-02-17 ENCOUNTER — Encounter: Payer: Self-pay | Admitting: Student

## 2020-02-17 ENCOUNTER — Other Ambulatory Visit: Payer: Self-pay

## 2020-02-17 ENCOUNTER — Ambulatory Visit (INDEPENDENT_AMBULATORY_CARE_PROVIDER_SITE_OTHER): Payer: Medicare Other | Admitting: Student

## 2020-02-17 VITALS — BP 138/62 | HR 74 | Ht 73.0 in | Wt 194.0 lb

## 2020-02-17 DIAGNOSIS — I480 Paroxysmal atrial fibrillation: Secondary | ICD-10-CM

## 2020-02-17 DIAGNOSIS — R0609 Other forms of dyspnea: Secondary | ICD-10-CM

## 2020-02-17 DIAGNOSIS — R0600 Dyspnea, unspecified: Secondary | ICD-10-CM | POA: Diagnosis not present

## 2020-02-17 DIAGNOSIS — I10 Essential (primary) hypertension: Secondary | ICD-10-CM | POA: Diagnosis not present

## 2020-02-17 DIAGNOSIS — R9439 Abnormal result of other cardiovascular function study: Secondary | ICD-10-CM

## 2020-02-17 DIAGNOSIS — E782 Mixed hyperlipidemia: Secondary | ICD-10-CM

## 2020-02-17 NOTE — Progress Notes (Signed)
Cardiology Office Note    Date:  02/17/2020   ID:  Kenneth Watkins., DOB February 19, 1946, MRN [id number]  PCP:  Lemmie Evens, MD  Cardiologist: Rozann Lesches, MD    Chief Complaint  Patient presents with  . Follow-up    s/p stress test    History of Present Illness:    Kenneth Watkins. is a 74 y.o. male with past medical history of paroxysmal atrial fibrillation, HTN, HLD, Type 2 DM, and Stage 2-3 CKD who presents to the office today for follow-up from his recent stress test.  He was last examined by Dr. Domenic Watkins on 02/08/2020 and reported occasional palpitations which would typically occur in the early morning hours. He did report worsening dyspnea on exertion and episodes of diaphoresis, therefore a stress test was recommended for further evaluation. This showed a small defect of moderate severity present in the mid inferior and apical inferior location and a mild degree of scar could not be entirely excluded. The study was overall low-risk but given his symptoms and family history of premature CAD, Dr. Domenic Watkins recommended close follow-up to discuss a cardiac catheterization.  In talking with the patient today, he reports continuing to have episodes of intermittent dyspnea on exertion. He can sometimes walk a nine-hole golf course without symptoms but reports he has noticed increased fatigue. He mostly notices his dyspnea on exertion during sexual activity which is new over the past few months. He reports becoming diaphoretic at that time but denies any specific chest pain. No recent orthopnea, PND or lower extremity edema.  He reports good compliance with Eliquis and denies any evidence of active bleeding.  Past Medical History:  Diagnosis Date  . BPH (benign prostatic hyperplasia)   . CKD (chronic kidney disease) stage 3, GFR 30-59 ml/min   . Hyperlipidemia   . Hypertension   . Persistent atrial fibrillation ([place])   . Pinched nerve in neck   . Type 2 diabetes mellitus (Kenneth Watkins)      Past Surgical History:  Procedure Laterality Date  . EYE SURGERY    . TONSILLECTOMY    . VASECTOMY      Current Medications: Outpatient Medications Prior to Visit  Medication Sig Dispense Refill  . Accu-Chek Softclix Lancets lancets 1 each by Other route as needed.    Marland Kitchen apixaban (ELIQUIS) 5 MG TABS tablet Take 5 mg by mouth 2 (two) times daily.    . B-D 3CC LUER-LOK SYR 22GX1" 22G X 1" 3 ML MISC 1 each by Other route as needed.    . BD DISP NEEDLES 18G X 1-1/2" MISC 1 each by Other route as needed.    . benazepril (LOTENSIN) 20 MG tablet Take 20 mg by mouth daily.    . chlorproMAZINE (THORAZINE) 25 MG tablet Take 1 tablet (25 mg total) by mouth 3 (three) times daily as needed. For hiccups 12 tablet 0  . chlorthalidone (HYGROTON) 25 MG tablet Take 25 mg by mouth daily.    . clotrimazole-betamethasone (LOTRISONE) cream Apply 1 application topically as needed.    . CONTOUR NEXT TEST test strip 1 each by Other route daily.    Marland Kitchen diltiazem (DILACOR XR) 240 MG 24 hr capsule Take 240 mg by mouth daily.    . methocarbamol (ROBAXIN) 500 MG tablet Take 1 tablet by mouth as needed.    . simvastatin (ZOCOR) 40 MG tablet Take 40 mg by mouth daily.    . tadalafil (ADCIRCA/CIALIS) 20 MG tablet Take 20 mg by mouth daily  as needed for erectile dysfunction.    Marland Kitchen testosterone cypionate (DEPO-TESTOSTERONE) 200 MG/ML injection Inject into the muscle every 14 (fourteen) days.     No facility-administered medications prior to visit.     Allergies:   Codeine   Social History   Socioeconomic History  . Marital status: Married    Spouse name: Not on file  . Number of children: Not on file  . Years of education: Not on file  . Highest education level: Not on file  Occupational History  . Occupation: Pharmacologist  Tobacco Use  . Smoking status: Never Smoker  . Smokeless tobacco: Never Used  Substance and Sexual Activity  . Alcohol use: No  . Drug use: No  . Sexual activity: Not on  file  Other Topics Concern  . Not on file  Social History Narrative  . Not on file   Social Determinants of Health   Financial Resource Strain:   . Difficulty of Paying Living Expenses:   Food Insecurity:   . Worried About Charity fundraiser in the Last Year:   . Arboriculturist in the Last Year:   Transportation Needs:   . Film/video editor (Medical):   Marland Kitchen Lack of Transportation (Non-Medical):   Physical Activity:   . Days of Exercise per Week:   . Minutes of Exercise per Session:   Stress:   . Feeling of Stress :   Social Connections:   . Frequency of Communication with Friends and Family:   . Frequency of Social Gatherings with Friends and Family:   . Attends Religious Services:   . Active Member of Clubs or Organizations:   . Attends Archivist Meetings:   Marland Kitchen Marital Status:      Family History:  The patient's family history includes Diabetes in his mother; Uterine cancer in his mother.   Review of Systems:   Please see the history of present illness.     General:  No chills, fever, night sweats or weight changes.  Cardiovascular:  No chest pain, edema, orthopnea, palpitations, paroxysmal nocturnal dyspnea. Positive for dyspnea on exertion.  Dermatological: No rash, lesions/masses Respiratory: No cough, dyspnea Urologic: No hematuria, dysuria Abdominal:   No nausea, vomiting, diarrhea, bright red blood per rectum, melena, or hematemesis Neurologic:  No visual changes, wkns, changes in mental status. All other systems reviewed and are otherwise negative except as noted above.   Physical Exam:    VS:  BP 138/62   Pulse 74   Ht 6\' 1"  (1.854 m)   Wt 194 lb (88 kg)   SpO2 99%   BMI 25.60 kg/m    General: Well developed, well nourished,male appearing in no acute distress. Head: Normocephalic, atraumatic, sclera non-icteric.  Neck: No carotid bruits. JVD not elevated.  Lungs: Respirations regular and unlabored, without wheezes or rales.  Heart:  Regular rate and rhythm. No S3 or S4.  No murmur, no rubs, or gallops appreciated. Abdomen: Soft, non-tender, non-distended. No obvious abdominal masses. Msk:  Strength and tone appear normal for age. No obvious joint deformities or effusions. Extremities: No clubbing or cyanosis. No lower extremity edema.  Distal pedal pulses are 2+ bilaterally. Neuro: Alert and oriented X 3. Moves all extremities spontaneously. No focal deficits noted. Psych:  Responds to questions appropriately with a normal affect. Skin: No rashes or lesions noted  Wt Readings from Last 3 Encounters:  02/17/20 194 lb (88 kg)  02/08/20 197 lb (89.4 kg)  08/10/19 199  lb (90.3 kg)     Studies/Labs Reviewed:   EKG:  EKG is not ordered today.   Recent Labs: 02/11/2020: BUN 17; Creat 1.28; Hemoglobin 17.6; Platelets 244; Potassium 3.8; Sodium 136   Lipid Panel No results found for: CHOL, TRIG, HDL, CHOLHDL, VLDL, LDLCALC, LDLDIRECT  Additional studies/ records that were reviewed today include:   Echocardiogram: 07/2018   NST: 01/2020  There was no ST segment deviation noted during stress.  Defect 1: There is a small defect of moderate severity present in the mid inferior and apical inferior location. While a mild degree of scar cannot entirely be excluded, soft tissue attneuation is also contributing to defect. No ischemic territories.  This is a low risk study.  Nuclear stress EF: 58%.  Assessment:    1. Abnormal stress test   2. Dyspnea on exertion   3. PAF (paroxysmal atrial fibrillation) ([place])   4. Essential hypertension   5. Mixed hyperlipidemia      Plan:   In order of problems listed above:  1. Abnormal Stress Test/Dyspnea on Exertion - He does report episodes of dyspnea on exertion as outlined above and recent stress test showed a small defect which was felt to be most consistent with scar but given his symptoms and risk factors, Dr. Domenic Watkins recommended a catheterization for further  evaluation. - The procedure was reviewed with the patient today and he is in agreement to proceed. The patient understands that risks include but are not limited to stroke (1 in 1000), death (1 in 13), kidney failure [usually temporary] (1 in 500), bleeding (1 in 200), allergic reaction [possibly serious] (1 in 200).  He did have a recent CBC and BMET last week and creatinine was overall stable at 1.28 which was improved as compared to prior labs. I have asked him to hold Chlorthalidone the morning of his catheterization. He will hold Eliquis 48 hours prior to the procedure.  - continue statin therapy. He is no longer on ASA given the need for anticoagulation.   2. Paroxysmal Atrial Fibrillation - He does report intermittent palpitations but these typically occur in the morning hours. He is in normal sinus rhythm by examination today. Remains on Cardizem CD 240 mg daily. - He denies any evidence of active bleeding. Continue Eliquis 5 mg twice daily for anticoagulation.  3. HTN - BP is well controlled at 130/62 during today's visit. Continue current medication regimen with Benzapril 20 mg daily, Chlorthalidone 25 mg daily and Cardizem CD 240 mg daily.  4. HLD - Followed by PCP. No recent records are available for review in EPIC and I will request copy of these from his PCP. Remains on Simvastatin 40 mg daily.   5. Stage 2-3 CKD - baseline creatinine 1.2 - 1.3. Stable at 1.28 by recent labs on 02/11/2020   Medication Adjustments/Labs and Tests Ordered: Current medicines are reviewed at length with the patient today.  Concerns regarding medicines are outlined above.  Medication changes, Labs and Tests ordered today are listed in the Patient Instructions below. Patient Instructions  Medication Instructions:  . *If you need a refill on your cardiac medications before your next appointment, please call your pharmacy*   Lab Work: NONE   If you have labs (blood work) drawn today and your tests  are completely normal, you will receive your results only by: Marland Kitchen MyChart Message (if you have MyChart) OR . A paper copy in the mail If you have any lab test that is abnormal or we  need to change your treatment, we will call you to review the results.   Testing/Procedures: Your physician has requested that you have a cardiac catheterization. Cardiac catheterization is used to diagnose and/or treat various heart conditions. Doctors may recommend this procedure for a number of different reasons. The most common reason is to evaluate chest pain. Chest pain can be a symptom of coronary artery disease (CAD), and cardiac catheterization can show whether plaque is narrowing or blocking your heart's arteries. This procedure is also used to evaluate the valves, as well as measure the blood flow and oxygen levels in different parts of your heart. For further information please visit [email or web address]. Please follow instruction sheet, as given.     Follow-Up: At [facility], you and your health needs are our priority.  As part of our continuing mission to provide you with exceptional heart care, we have created designated Provider Care Teams.  These Care Teams include your primary Cardiologist (physician) and Advanced Practice Providers (APPs -  Physician Assistants and Nurse Practitioners) who all work together to provide you with the care you need, when you need it.  We recommend signing up for the patient portal called "MyChart".  Sign up information is provided on this After Visit Summary.  MyChart is used to connect with patients for Virtual Visits (Telemedicine).  Patients are able to view lab/test results, encounter notes, upcoming appointments, etc.  Non-urgent messages can be sent to your provider as well.   To learn more about what you can do with MyChart, go to [email or web address].    Your next appointment:   3 week(s)  The format for your next appointment:   In Person  Provider:    Rozann Lesches, MD or Bernerd Pho, PA-C   Other Instructions Thank you for choosing [facility]!     [place] [id number] Dept: [phone] Loc: [place].  02/17/2020  You are scheduled for a Cardiac Catheterization on Tuesday, June 8 with Dr. Peter Martinique.  1. Please arrive at the [facility] (Main Entrance A) at [facility]: [address] [place], [place] [address] at 7:00 AM (This time is two hours before your procedure to ensure your preparation). Free valet parking service is available.   Special note: Every effort is made to have your procedure done on time. Please understand that emergencies sometimes delay scheduled procedures.  2. Diet: Do not eat solid foods after midnight.  The patient may have clear liquids until 5am upon the day of the procedure.  3. Labs:competed 4. Medication instructions in preparation for your procedure:   Contrast Allergy: No   Stop taking Eliquis (Apixiban) on Saturday, June 5.  Hold chorthalidone the morning of procedure  On the morning of your procedure, take your Aspirin and any morning medicines NOT listed above.  You may use sips of water.  5. Plan for one night stay--bring personal belongings. 6. Bring a current list of your medications and current insurance cards. 7. You MUST have a responsible person to drive you home. 8. Someone MUST be with you the first 24 hours after you arrive home or your discharge will be delayed. 9. Please wear clothes that are easy to get on and off and wear slip-on shoes.  Thank you for allowing [place] to care for you!   -- [facility]   Signed, Erma Heritage, PA-C  02/17/2020  4:45 PM    [place] [address] [place], [place] [address] Phone: [phone] Fax: [phone]

## 2020-02-17 NOTE — H&P (View-Only) (Signed)
Cardiology Office Note    Date:  02/17/2020   ID:  Kenneth Watkins., DOB 1946-08-23, MRN [id number]  PCP:  Lemmie Evens, MD  Cardiologist: Rozann Lesches, MD    Chief Complaint  Patient presents with  . Follow-up    s/p stress test    History of Present Illness:    Kenneth Watkins. is a 74 y.o. male with past medical history of paroxysmal atrial fibrillation, HTN, HLD, Type 2 DM, and Stage 2-3 CKD who presents to the office today for follow-up from his recent stress test.  He was last examined by Dr. Domenic Polite on 02/08/2020 and reported occasional palpitations which would typically occur in the early morning hours. He did report worsening dyspnea on exertion and episodes of diaphoresis, therefore a stress test was recommended for further evaluation. This showed a small defect of moderate severity present in the mid inferior and apical inferior location and a mild degree of scar could not be entirely excluded. The study was overall low-risk but given his symptoms and family history of premature CAD, Dr. Domenic Polite recommended close follow-up to discuss a cardiac catheterization.  In talking with the patient today, he reports continuing to have episodes of intermittent dyspnea on exertion. He can sometimes walk a nine-hole golf course without symptoms but reports he has noticed increased fatigue. He mostly notices his dyspnea on exertion during sexual activity which is new over the past few months. He reports becoming diaphoretic at that time but denies any specific chest pain. No recent orthopnea, PND or lower extremity edema.  He reports good compliance with Eliquis and denies any evidence of active bleeding.  Past Medical History:  Diagnosis Date  . BPH (benign prostatic hyperplasia)   . CKD (chronic kidney disease) stage 3, GFR 30-59 ml/min   . Hyperlipidemia   . Hypertension   . Persistent atrial fibrillation ([place])   . Pinched nerve in neck   . Type 2 diabetes mellitus ([place])      Past Surgical History:  Procedure Laterality Date  . EYE SURGERY    . TONSILLECTOMY    . VASECTOMY      Current Medications: Outpatient Medications Prior to Visit  Medication Sig Dispense Refill  . Accu-Chek Softclix Lancets lancets 1 each by Other route as needed.    Marland Kitchen apixaban (ELIQUIS) 5 MG TABS tablet Take 5 mg by mouth 2 (two) times daily.    . B-D 3CC LUER-LOK SYR 22GX1" 22G X 1" 3 ML MISC 1 each by Other route as needed.    . BD DISP NEEDLES 18G X 1-1/2" MISC 1 each by Other route as needed.    . benazepril (LOTENSIN) 20 MG tablet Take 20 mg by mouth daily.    . chlorproMAZINE (THORAZINE) 25 MG tablet Take 1 tablet (25 mg total) by mouth 3 (three) times daily as needed. For hiccups 12 tablet 0  . chlorthalidone (HYGROTON) 25 MG tablet Take 25 mg by mouth daily.    . clotrimazole-betamethasone (LOTRISONE) cream Apply 1 application topically as needed.    . CONTOUR NEXT TEST test strip 1 each by Other route daily.    Marland Kitchen diltiazem (DILACOR XR) 240 MG 24 hr capsule Take 240 mg by mouth daily.    . methocarbamol (ROBAXIN) 500 MG tablet Take 1 tablet by mouth as needed.    . simvastatin (ZOCOR) 40 MG tablet Take 40 mg by mouth daily.    . tadalafil (ADCIRCA/CIALIS) 20 MG tablet Take 20 mg by mouth daily  as needed for erectile dysfunction.    Marland Kitchen testosterone cypionate (DEPO-TESTOSTERONE) 200 MG/ML injection Inject into the muscle every 14 (fourteen) days.     No facility-administered medications prior to visit.     Allergies:   Codeine   Social History   Socioeconomic History  . Marital status: Married    Spouse name: Not on file  . Number of children: Not on file  . Years of education: Not on file  . Highest education level: Not on file  Occupational History  . Occupation: Pharmacologist  Tobacco Use  . Smoking status: Never Smoker  . Smokeless tobacco: Never Used  Substance and Sexual Activity  . Alcohol use: No  . Drug use: No  . Sexual activity: Not on  file  Other Topics Concern  . Not on file  Social History Narrative  . Not on file   Social Determinants of Health   Financial Resource Strain:   . Difficulty of Paying Living Expenses:   Food Insecurity:   . Worried About Charity fundraiser in the Last Year:   . Arboriculturist in the Last Year:   Transportation Needs:   . Film/video editor (Medical):   Marland Kitchen Lack of Transportation (Non-Medical):   Physical Activity:   . Days of Exercise per Week:   . Minutes of Exercise per Session:   Stress:   . Feeling of Stress :   Social Connections:   . Frequency of Communication with Friends and Family:   . Frequency of Social Gatherings with Friends and Family:   . Attends Religious Services:   . Active Member of Clubs or Organizations:   . Attends Archivist Meetings:   Marland Kitchen Marital Status:      Family History:  The patient's family history includes Diabetes in his mother; Uterine cancer in his mother.   Review of Systems:   Please see the history of present illness.     General:  No chills, fever, night sweats or weight changes.  Cardiovascular:  No chest pain, edema, orthopnea, palpitations, paroxysmal nocturnal dyspnea. Positive for dyspnea on exertion.  Dermatological: No rash, lesions/masses Respiratory: No cough, dyspnea Urologic: No hematuria, dysuria Abdominal:   No nausea, vomiting, diarrhea, bright red blood per rectum, melena, or hematemesis Neurologic:  No visual changes, wkns, changes in mental status. All other systems reviewed and are otherwise negative except as noted above.   Physical Exam:    VS:  BP 138/62   Pulse 74   Ht 6\' 1"  (1.854 m)   Wt 194 lb (88 kg)   SpO2 99%   BMI 25.60 kg/m    General: Well developed, well nourished,male appearing in no acute distress. Head: Normocephalic, atraumatic, sclera non-icteric.  Neck: No carotid bruits. JVD not elevated.  Lungs: Respirations regular and unlabored, without wheezes or rales.  Heart:  Regular rate and rhythm. No S3 or S4.  No murmur, no rubs, or gallops appreciated. Abdomen: Soft, non-tender, non-distended. No obvious abdominal masses. Msk:  Strength and tone appear normal for age. No obvious joint deformities or effusions. Extremities: No clubbing or cyanosis. No lower extremity edema.  Distal pedal pulses are 2+ bilaterally. Neuro: Alert and oriented X 3. Moves all extremities spontaneously. No focal deficits noted. Psych:  Responds to questions appropriately with a normal affect. Skin: No rashes or lesions noted  Wt Readings from Last 3 Encounters:  02/17/20 194 lb (88 kg)  02/08/20 197 lb (89.4 kg)  08/10/19 199  lb (90.3 kg)     Studies/Labs Reviewed:   EKG:  EKG is not ordered today.   Recent Labs: 02/11/2020: BUN 17; Creat 1.28; Hemoglobin 17.6; Platelets 244; Potassium 3.8; Sodium 136   Lipid Panel No results found for: CHOL, TRIG, HDL, CHOLHDL, VLDL, LDLCALC, LDLDIRECT  Additional studies/ records that were reviewed today include:   Echocardiogram: 07/2018   NST: 01/2020  There was no ST segment deviation noted during stress.  Defect 1: There is a small defect of moderate severity present in the mid inferior and apical inferior location. While a mild degree of scar cannot entirely be excluded, soft tissue attneuation is also contributing to defect. No ischemic territories.  This is a low risk study.  Nuclear stress EF: 58%.  Assessment:    1. Abnormal stress test   2. Dyspnea on exertion   3. PAF (paroxysmal atrial fibrillation) ([place])   4. Essential hypertension   5. Mixed hyperlipidemia      Plan:   In order of problems listed above:  1. Abnormal Stress Test/Dyspnea on Exertion - He does report episodes of dyspnea on exertion as outlined above and recent stress test showed a small defect which was felt to be most consistent with scar but given his symptoms and risk factors, Dr. Domenic Polite recommended a catheterization for further  evaluation. - The procedure was reviewed with the patient today and he is in agreement to proceed. The patient understands that risks include but are not limited to stroke (1 in 1000), death (1 in 60), kidney failure [usually temporary] (1 in 500), bleeding (1 in 200), allergic reaction [possibly serious] (1 in 200).  He did have a recent CBC and BMET last week and creatinine was overall stable at 1.28 which was improved as compared to prior labs. I have asked him to hold Chlorthalidone the morning of his catheterization. He will hold Eliquis 48 hours prior to the procedure.  - continue statin therapy. He is no longer on ASA given the need for anticoagulation.   2. Paroxysmal Atrial Fibrillation - He does report intermittent palpitations but these typically occur in the morning hours. He is in normal sinus rhythm by examination today. Remains on Cardizem CD 240 mg daily. - He denies any evidence of active bleeding. Continue Eliquis 5 mg twice daily for anticoagulation.  3. HTN - BP is well controlled at 130/62 during today's visit. Continue current medication regimen with Benzapril 20 mg daily, Chlorthalidone 25 mg daily and Cardizem CD 240 mg daily.  4. HLD - Followed by PCP. No recent records are available for review in EPIC and I will request copy of these from his PCP. Remains on Simvastatin 40 mg daily.   5. Stage 2-3 CKD - baseline creatinine 1.2 - 1.3. Stable at 1.28 by recent labs on 02/11/2020   Medication Adjustments/Labs and Tests Ordered: Current medicines are reviewed at length with the patient today.  Concerns regarding medicines are outlined above.  Medication changes, Labs and Tests ordered today are listed in the Patient Instructions below. Patient Instructions  Medication Instructions:  . *If you need a refill on your cardiac medications before your next appointment, please call your pharmacy*   Lab Work: NONE   If you have labs (blood work) drawn today and your tests  are completely normal, you will receive your results only by: Marland Kitchen MyChart Message (if you have MyChart) OR . A paper copy in the mail If you have any lab test that is abnormal or we  need to change your treatment, we will call you to review the results.   Testing/Procedures: Your physician has requested that you have a cardiac catheterization. Cardiac catheterization is used to diagnose and/or treat various heart conditions. Doctors may recommend this procedure for a number of different reasons. The most common reason is to evaluate chest pain. Chest pain can be a symptom of coronary artery disease (CAD), and cardiac catheterization can show whether plaque is narrowing or blocking your heart's arteries. This procedure is also used to evaluate the valves, as well as measure the blood flow and oxygen levels in different parts of your heart. For further information please visit HugeFiesta.[email or web address]. Please follow instruction sheet, as given.     Follow-Up: At [facility], you and your health needs are our priority.  As part of our continuing mission to provide you with exceptional heart care, we have created designated Provider Care Teams.  These Care Teams include your primary Cardiologist (physician) and Advanced Practice Providers (APPs -  Physician Assistants and Nurse Practitioners) who all work together to provide you with the care you need, when you need it.  We recommend signing up for the patient portal called "MyChart".  Sign up information is provided on this After Visit Summary.  MyChart is used to connect with patients for Virtual Visits (Telemedicine).  Patients are able to view lab/test results, encounter notes, upcoming appointments, etc.  Non-urgent messages can be sent to your provider as well.   To learn more about what you can do with MyChart, go to [email or web address].    Your next appointment:   3 week(s)  The format for your next appointment:   In Person  Provider:    Rozann Lesches, MD or Bernerd Pho, PA-C   Other Instructions Thank you for choosing [facility]!     [place] [address] Dept: [phone] Loc: [place].  02/17/2020  You are scheduled for a Cardiac Catheterization on Tuesday, June 8 with Dr. Peter Martinique.  1. Please arrive at the [facility] (Main Entrance A) at [facility]: [address] [place],  [address] at 7:00 AM (This time is two hours before your procedure to ensure your preparation). Free valet parking service is available.   Special note: Every effort is made to have your procedure done on time. Please understand that emergencies sometimes delay scheduled procedures.  2. Diet: Do not eat solid foods after midnight.  The patient may have clear liquids until 5am upon the day of the procedure.  3. Labs:competed 4. Medication instructions in preparation for your procedure:   Contrast Allergy: No   Stop taking Eliquis (Apixiban) on Saturday, June 5.  Hold chorthalidone the morning of procedure  On the morning of your procedure, take your Aspirin and any morning medicines NOT listed above.  You may use sips of water.  5. Plan for one night stay--bring personal belongings. 6. Bring a current list of your medications and current insurance cards. 7. You MUST have a responsible person to drive you home. 8. Someone MUST be with you the first 24 hours after you arrive home or your discharge will be delayed. 9. Please wear clothes that are easy to get on and off and wear slip-on shoes.  Thank you for allowing [place] to care for you!   -- [facility]   Signed, Erma Heritage, PA-C  02/17/2020  4:45 PM    [place] [address] [place], [place] [address] Phone: [phone] Fax: [phone]

## 2020-02-17 NOTE — Patient Instructions (Signed)
Medication Instructions:  . *If you need a refill on your cardiac medications before your next appointment, please call your pharmacy*   Lab Work: NONE   If you have labs (blood work) drawn today and your tests are completely normal, you will receive your results only by:  [place] (if you have MyChart) OR  A paper copy in the mail If you have any lab test that is abnormal or we need to change your treatment, we will call you to review the results.   Testing/Procedures: Your physician has requested that you have a cardiac catheterization. Cardiac catheterization is used to diagnose and/or treat various heart conditions. Doctors may recommend this procedure for a number of different reasons. The most common reason is to evaluate chest pain. Chest pain can be a symptom of coronary artery disease (CAD), and cardiac catheterization can show whether plaque is narrowing or blocking your hearts arteries. This procedure is also used to evaluate the valves, as well as measure the blood flow and oxygen levels in different parts of your heart. For further information please visit HugeFiesta.[email or web address]. Please follow instruction sheet, as given.     Follow-Up: At [facility], you and your health needs are our priority.  As part of our continuing mission to provide you with exceptional heart care, we have created designated Provider Care Teams.  These Care Teams include your primary Cardiologist (physician) and Advanced Practice Providers (APPs -  Physician Assistants and Nurse Practitioners) who all work together to provide you with the care you need, when you need it.  We recommend signing up for the patient portal called "MyChart".  Sign up information is provided on this After Visit Summary.  MyChart is used to connect with patients for Virtual Visits (Telemedicine).  Patients are able to view lab/test results, encounter notes, upcoming appointments, etc.  Non-urgent messages can be sent to  your provider as well.   To learn more about what you can do with MyChart, go to [email or web address].    Your next appointment:   3 week(s)  The format for your next appointment:   In Person  Provider:   Rozann Lesches, MD or Bernerd Pho, PA-C   Other Instructions Thank you for choosing [facility]!      [place] [address] Dept: [phone] Loc: [place].  02/17/2020  You are scheduled for a Cardiac Catheterization on Tuesday, June 8 with Dr. Peter Martinique.  1. Please arrive at the [facility] (Main Entrance A) at [facility]: [address] [place], [place] [address] at 7:00 AM (This time is two hours before your procedure to ensure your preparation). Free valet parking service is available.   Special note: Every effort is made to have your procedure done on time. Please understand that emergencies sometimes delay scheduled procedures.  2. Diet: Do not eat solid foods after midnight.  The patient may have clear liquids until 5am upon the day of the procedure.  3. Labs:competed 4. Medication instructions in preparation for your procedure:   Contrast Allergy: No   Stop taking Eliquis (Apixiban) on Saturday, June 5.  Hold chorthalidone the morning of procedure  On the morning of your procedure, take your Aspirin and any morning medicines NOT listed above.  You may use sips of water.  5. Plan for one night stay--bring personal belongings. 6. Bring a current list of your medications and current  insurance cards. 7. You MUST have a responsible person to drive you home. 8. Someone MUST be with you the first 24 hours after you arrive home or your discharge will be delayed. 9. Please wear clothes that are easy to get on and off and wear slip-on shoes.  Thank you for allowing [place] to care for you!   -- [facility]

## 2020-02-19 ENCOUNTER — Other Ambulatory Visit (HOSPITAL_COMMUNITY)
Admission: RE | Admit: 2020-02-19 | Discharge: 2020-02-19 | Disposition: A | Discharge status: Home / Self Care | Payer: Medicare Other | Source: Ambulatory Visit | Attending: Cardiology | Admitting: Cardiology

## 2020-02-19 DIAGNOSIS — Z01812 Encounter for preprocedural laboratory examination: Secondary | ICD-10-CM | POA: Insufficient documentation

## 2020-02-19 DIAGNOSIS — Z20822 Contact with and (suspected) exposure to covid-19: Secondary | ICD-10-CM | POA: Insufficient documentation

## 2020-02-20 LAB — SARS CORONAVIRUS 2 (TAT 6-24 HRS): SARS Coronavirus 2: NEGATIVE

## 2020-02-22 ENCOUNTER — Telehealth: Payer: Self-pay | Admitting: *Deleted

## 2020-02-22 NOTE — Telephone Encounter (Signed)
Pt contacted pre-catheterization scheduled at [facility] for: Tuesday February 23, 2020 9 AM Verified arrival time and place: [place] [facility]) at: 7 AM   No solid food after midnight prior to cath, clear liquids until 5 AM day of procedure.  Hold: Eliquis-none 02/21/20 until post procedure-last dose AM 02/20/20 per pt. Chlorthalidone-AM of procedure Cialis-until post procedure.   Except hold medications AM meds can be  taken pre-cath with sip of water including: ASA 81 mg   Confirmed patient has responsible adult to drive home post procedure and observe 24 hours after arriving home: yes  You are allowed ONE visitor in the waiting room during your procedure. Both you and your visitor must wear masks.      COVID-19 Pre-Screening Questions:  . In the past 7 to 10 days have you had a cough,  shortness of breath, headache, congestion, fever (100 or greater) body aches, chills, sore throat, or sudden loss of taste or sense of smell? no . Have you been around anyone with known Covid 19 in the past 7 to 10 days? no . Have you been around anyone who is awaiting Covid 19 test results in the past 7 to 10 days? no . Have you been around anyone who has mentioned symptoms of Covid 19 within the past 7 to 10 days? no   Reviewed procedure/mask/visitor instructions, COVID-19 screening questions with patient.

## 2020-02-23 ENCOUNTER — Encounter (HOSPITAL_COMMUNITY): Payer: Self-pay | Admitting: Cardiovascular Disease

## 2020-02-23 ENCOUNTER — Other Ambulatory Visit: Payer: Self-pay

## 2020-02-23 ENCOUNTER — Inpatient Hospital Stay (HOSPITAL_COMMUNITY)
Admission: RE | Admit: 2020-02-23 | Discharge: 2020-02-25 | DRG: 247 | Disposition: A | Discharge status: Home / Self Care | Payer: Medicare Other | Attending: Cardiovascular Disease | Admitting: Cardiovascular Disease

## 2020-02-23 ENCOUNTER — Encounter (HOSPITAL_COMMUNITY)
Admission: RE | Disposition: A | Discharge status: Home / Self Care | Payer: Self-pay | Source: Home / Self Care | Attending: Cardiovascular Disease

## 2020-02-23 DIAGNOSIS — Z79890 Hormone replacement therapy (postmenopausal): Secondary | ICD-10-CM | POA: Diagnosis not present

## 2020-02-23 DIAGNOSIS — R9439 Abnormal result of other cardiovascular function study: Secondary | ICD-10-CM

## 2020-02-23 DIAGNOSIS — N182 Chronic kidney disease, stage 2 (mild): Secondary | ICD-10-CM

## 2020-02-23 DIAGNOSIS — E785 Hyperlipidemia, unspecified: Secondary | ICD-10-CM

## 2020-02-23 DIAGNOSIS — I2510 Atherosclerotic heart disease of native coronary artery without angina pectoris: Secondary | ICD-10-CM | POA: Diagnosis present

## 2020-02-23 DIAGNOSIS — I129 Hypertensive chronic kidney disease with stage 1 through stage 4 chronic kidney disease, or unspecified chronic kidney disease: Secondary | ICD-10-CM | POA: Diagnosis present

## 2020-02-23 DIAGNOSIS — Z833 Family history of diabetes mellitus: Secondary | ICD-10-CM | POA: Diagnosis not present

## 2020-02-23 DIAGNOSIS — E782 Mixed hyperlipidemia: Secondary | ICD-10-CM | POA: Diagnosis present

## 2020-02-23 DIAGNOSIS — Z79899 Other long term (current) drug therapy: Secondary | ICD-10-CM

## 2020-02-23 DIAGNOSIS — I4819 Other persistent atrial fibrillation: Secondary | ICD-10-CM | POA: Diagnosis present

## 2020-02-23 DIAGNOSIS — E118 Type 2 diabetes mellitus with unspecified complications: Secondary | ICD-10-CM

## 2020-02-23 DIAGNOSIS — N400 Enlarged prostate without lower urinary tract symptoms: Secondary | ICD-10-CM | POA: Diagnosis present

## 2020-02-23 DIAGNOSIS — Z8249 Family history of ischemic heart disease and other diseases of the circulatory system: Secondary | ICD-10-CM | POA: Diagnosis not present

## 2020-02-23 DIAGNOSIS — E1122 Type 2 diabetes mellitus with diabetic chronic kidney disease: Secondary | ICD-10-CM | POA: Diagnosis present

## 2020-02-23 DIAGNOSIS — R0609 Other forms of dyspnea: Secondary | ICD-10-CM

## 2020-02-23 DIAGNOSIS — Z955 Presence of coronary angioplasty implant and graft: Secondary | ICD-10-CM

## 2020-02-23 DIAGNOSIS — I10 Essential (primary) hypertension: Secondary | ICD-10-CM | POA: Diagnosis not present

## 2020-02-23 DIAGNOSIS — Z7901 Long term (current) use of anticoagulants: Secondary | ICD-10-CM | POA: Diagnosis not present

## 2020-02-23 DIAGNOSIS — I480 Paroxysmal atrial fibrillation: Secondary | ICD-10-CM | POA: Diagnosis not present

## 2020-02-23 HISTORY — PX: LEFT HEART CATH AND CORONARY ANGIOGRAPHY: CATH118249

## 2020-02-23 LAB — GLUCOSE, CAPILLARY
Glucose-Capillary: 171 mg/dL — ABNORMAL HIGH (ref 70–99)
Glucose-Capillary: 153 mg/dL — ABNORMAL HIGH (ref 70–99)
Glucose-Capillary: 208 mg/dL — ABNORMAL HIGH (ref 70–99)

## 2020-02-23 LAB — POCT ACTIVATED CLOTTING TIME: Activated Clotting Time: 274 seconds

## 2020-02-23 SURGERY — LEFT HEART CATH AND CORONARY ANGIOGRAPHY
Anesthesia: LOCAL

## 2020-02-23 MED ORDER — SODIUM CHLORIDE 0.9 % WEIGHT BASED INFUSION
3.0000 mL/kg/h | INTRAVENOUS | Status: DC
  Administered 2020-02-23: 3 mL/kg/h via INTRAVENOUS

## 2020-02-23 MED ORDER — SODIUM CHLORIDE 0.9% FLUSH
3.0000 mL | Freq: Two times a day (BID) | INTRAVENOUS | Status: DC
  Administered 2020-02-23 – 2020-02-24 (×2): 3 mL via INTRAVENOUS

## 2020-02-23 MED ORDER — ONDANSETRON HCL 4 MG/2ML IJ SOLN: 4.0000 mg | Freq: Four times a day (QID) | INTRAMUSCULAR | Status: DC | PRN

## 2020-02-23 MED ORDER — MIDAZOLAM HCL 2 MG/2ML IJ SOLN
INTRAMUSCULAR | Status: AC
  Filled 2020-02-23: qty 2

## 2020-02-23 MED ORDER — HEPARIN (PORCINE) IN NACL 1000-0.9 UT/500ML-% IV SOLN
INTRAVENOUS | Status: AC
  Filled 2020-02-23: qty 500

## 2020-02-23 MED ORDER — ASPIRIN 81 MG PO CHEW
81.0000 mg | CHEWABLE_TABLET | Freq: Every day | ORAL | Status: DC
  Administered 2020-02-24 – 2020-02-25 (×2): 81 mg via ORAL
  Filled 2020-02-23 (×2): qty 1

## 2020-02-23 MED ORDER — HEPARIN SODIUM (PORCINE) 1000 UNIT/ML IJ SOLN
INTRAMUSCULAR | Status: DC | PRN
  Administered 2020-02-23: 6000 [IU] via INTRAVENOUS
  Administered 2020-02-23: 4500 [IU] via INTRAVENOUS

## 2020-02-23 MED ORDER — ASPIRIN 81 MG PO CHEW: 81.0000 mg | CHEWABLE_TABLET | ORAL | Status: DC

## 2020-02-23 MED ORDER — SODIUM CHLORIDE 0.9 % IV SOLN: INTRAVENOUS | Status: DC

## 2020-02-23 MED ORDER — SODIUM CHLORIDE 0.9% FLUSH
3.0000 mL | Freq: Two times a day (BID) | INTRAVENOUS | Status: DC
  Administered 2020-02-24: 3 mL via INTRAVENOUS

## 2020-02-23 MED ORDER — CLOPIDOGREL BISULFATE 300 MG PO TABS
ORAL_TABLET | ORAL | Status: AC
  Filled 2020-02-23: qty 1

## 2020-02-23 MED ORDER — DILTIAZEM HCL ER COATED BEADS 240 MG PO CP24
240.0000 mg | ORAL_CAPSULE | Freq: Every day | ORAL | Status: DC
  Administered 2020-02-24 – 2020-02-25 (×2): 240 mg via ORAL
  Filled 2020-02-23 (×2): qty 1

## 2020-02-23 MED ORDER — DILTIAZEM HCL ER 240 MG PO CP24: 240.0000 mg | ORAL_CAPSULE | Freq: Every day | ORAL | Status: DC

## 2020-02-23 MED ORDER — MIDAZOLAM HCL 2 MG/2ML IJ SOLN
INTRAMUSCULAR | Status: DC | PRN
  Administered 2020-02-23: 1 mg via INTRAVENOUS
  Administered 2020-02-23: 2 mg via INTRAVENOUS

## 2020-02-23 MED ORDER — IOHEXOL 350 MG/ML SOLN
INTRAVENOUS | Status: DC | PRN
  Administered 2020-02-23: 100 mL

## 2020-02-23 MED ORDER — FENTANYL CITRATE (PF) 100 MCG/2ML IJ SOLN
INTRAMUSCULAR | Status: DC | PRN
  Administered 2020-02-23 (×2): 25 ug via INTRAVENOUS

## 2020-02-23 MED ORDER — SODIUM CHLORIDE 0.9% FLUSH: 3.0000 mL | INTRAVENOUS | Status: DC | PRN

## 2020-02-23 MED ORDER — LIDOCAINE HCL (PF) 1 % IJ SOLN
INTRAMUSCULAR | Status: DC | PRN
  Administered 2020-02-23: 2 mL

## 2020-02-23 MED ORDER — VERAPAMIL HCL 2.5 MG/ML IV SOLN
INTRAVENOUS | Status: DC | PRN
  Administered 2020-02-23: 10 mL via INTRA_ARTERIAL

## 2020-02-23 MED ORDER — CLOPIDOGREL BISULFATE 300 MG PO TABS
ORAL_TABLET | ORAL | Status: DC | PRN
  Administered 2020-02-23: 600 mg via ORAL

## 2020-02-23 MED ORDER — HEPARIN (PORCINE) IN NACL 1000-0.9 UT/500ML-% IV SOLN
INTRAVENOUS | Status: DC | PRN
  Administered 2020-02-23 (×2): 500 mL

## 2020-02-23 MED ORDER — CLOPIDOGREL BISULFATE 75 MG PO TABS
75.0000 mg | ORAL_TABLET | Freq: Every day | ORAL | Status: DC
  Administered 2020-02-24 – 2020-02-25 (×2): 75 mg via ORAL
  Filled 2020-02-23 (×2): qty 1

## 2020-02-23 MED ORDER — HEPARIN SODIUM (PORCINE) 1000 UNIT/ML IJ SOLN
INTRAMUSCULAR | Status: AC
  Filled 2020-02-23: qty 1

## 2020-02-23 MED ORDER — ACETAMINOPHEN 325 MG PO TABS: 650.0000 mg | ORAL_TABLET | ORAL | Status: DC | PRN

## 2020-02-23 MED ORDER — LABETALOL HCL 5 MG/ML IV SOLN: 10.0000 mg | INTRAVENOUS | Status: AC | PRN

## 2020-02-23 MED ORDER — SODIUM CHLORIDE 0.9 % IV SOLN: 250.0000 mL | INTRAVENOUS | Status: DC | PRN

## 2020-02-23 MED ORDER — SODIUM CHLORIDE 0.9 % WEIGHT BASED INFUSION: 1.0000 mL/kg/h | INTRAVENOUS | Status: DC

## 2020-02-23 MED ORDER — FENTANYL CITRATE (PF) 100 MCG/2ML IJ SOLN
INTRAMUSCULAR | Status: AC
  Filled 2020-02-23: qty 2

## 2020-02-23 MED ORDER — LIDOCAINE HCL (PF) 1 % IJ SOLN
INTRAMUSCULAR | Status: AC
  Filled 2020-02-23: qty 30

## 2020-02-23 MED ORDER — VERAPAMIL HCL 2.5 MG/ML IV SOLN
INTRAVENOUS | Status: AC
  Filled 2020-02-23: qty 2

## 2020-02-23 MED ORDER — ATORVASTATIN CALCIUM 80 MG PO TABS
80.0000 mg | ORAL_TABLET | Freq: Every day | ORAL | Status: DC
  Administered 2020-02-23 – 2020-02-25 (×3): 80 mg via ORAL
  Filled 2020-02-23 (×3): qty 1

## 2020-02-23 MED ORDER — HYDRALAZINE HCL 20 MG/ML IJ SOLN: 10.0000 mg | INTRAMUSCULAR | Status: AC | PRN

## 2020-02-23 MED ORDER — DIAZEPAM 5 MG PO TABS: 5.0000 mg | ORAL_TABLET | Freq: Four times a day (QID) | ORAL | Status: DC | PRN

## 2020-02-23 SURGICAL SUPPLY — 15 items
CATH 5FR JL3.5 JR4 ANG PIG MP (CATHETERS) ×2 IMPLANT
CATH INFINITI 5 FR 3DRC (CATHETERS) ×2 IMPLANT
CATH LAUNCHER 6FR EBU3.5 (CATHETERS) ×2 IMPLANT
CATH OPTITORQUE TIG 4.0 5F (CATHETERS) ×2 IMPLANT
CATH VISTA GUIDE 6FR XB3.5 (CATHETERS) ×2 IMPLANT
DEVICE RAD COMP TR BAND LRG (VASCULAR PRODUCTS) ×2 IMPLANT
GLIDESHEATH SLEND SS 6F .021 (SHEATH) ×2 IMPLANT
GUIDEWIRE INQWIRE 1.5J.035X260 (WIRE) ×1 IMPLANT
INQWIRE 1.5J .035X260CM (WIRE) ×2
KIT ENCORE 26 ADVANTAGE (KITS) ×2 IMPLANT
KIT HEART LEFT (KITS) ×2 IMPLANT
PACK CARDIAC CATHETERIZATION (CUSTOM PROCEDURE TRAY) ×2 IMPLANT
TRANSDUCER W/STOPCOCK (MISCELLANEOUS) ×2 IMPLANT
TUBING CIL FLEX 10 FLL-RA (TUBING) ×2 IMPLANT
WIRE COUGAR XT STRL 190CM (WIRE) ×2 IMPLANT

## 2020-02-23 NOTE — Interval H&P Note (Signed)
Cath Lab Visit (complete for each Cath Lab visit)  Clinical Evaluation Leading to the Procedure:   ACS: No.  Non-ACS:    Anginal Classification: CCS II  Anti-ischemic medical therapy: Minimal Therapy (1 class of medications)  Non-Invasive Test Results: Low-risk stress test findings: cardiac mortality <1%/year  Prior CABG: No previous CABG      History and Physical Interval Note:  02/23/2020 9:12 AM  Kenneth Watkins.  has presented today for surgery, with the diagnosis of abnormal stress test.  The various methods of treatment have been discussed with the patient and family. After consideration of risks, benefits and other options for treatment, the patient has consented to  Procedure(s): LEFT HEART CATH AND CORONARY ANGIOGRAPHY (N/A) as a surgical intervention.  The patient's history has been reviewed, patient examined, no change in status, stable for surgery.  I have reviewed the patient's chart and labs.  Questions were answered to the patient's satisfaction.     Shelva Majestic

## 2020-02-24 ENCOUNTER — Encounter (HOSPITAL_COMMUNITY)
Admission: RE | Disposition: A | Discharge status: Home / Self Care | Payer: Self-pay | Source: Home / Self Care | Attending: Cardiovascular Disease

## 2020-02-24 ENCOUNTER — Ambulatory Visit (HOSPITAL_COMMUNITY): Admission: RE | Admit: 2020-02-24 | Payer: Medicare Other | Source: Home / Self Care | Admitting: Cardiovascular Disease

## 2020-02-24 DIAGNOSIS — I10 Essential (primary) hypertension: Secondary | ICD-10-CM

## 2020-02-24 DIAGNOSIS — R9439 Abnormal result of other cardiovascular function study: Secondary | ICD-10-CM

## 2020-02-24 DIAGNOSIS — I480 Paroxysmal atrial fibrillation: Secondary | ICD-10-CM

## 2020-02-24 DIAGNOSIS — E785 Hyperlipidemia, unspecified: Secondary | ICD-10-CM

## 2020-02-24 HISTORY — PX: CORONARY STENT INTERVENTION: CATH118234

## 2020-02-24 LAB — BASIC METABOLIC PANEL
Anion gap: 9 (ref 5–15)
BUN: 12 mg/dL (ref 8–23)
CO2: 25 mmol/L (ref 22–32)
Calcium: 8.5 mg/dL — ABNORMAL LOW (ref 8.9–10.3)
Chloride: 107 mmol/L (ref 98–111)
Creatinine, Ser: 1.24 mg/dL (ref 0.61–1.24)
GFR calc Af Amer: >60 mL/min (ref >60)
GFR calc non Af Amer: 58 mL/min — ABNORMAL LOW (ref >60)
Glucose, Bld: 122 mg/dL — ABNORMAL HIGH (ref 70–99)
Potassium: 3.7 mmol/L (ref 3.5–5.1)
Sodium: 141 mmol/L (ref 135–145)

## 2020-02-24 LAB — CBC
HCT: 45.7 % (ref 39.0–52.0)
Hemoglobin: 15.7 g/dL (ref 13.0–17.0)
MCH: 30.8 pg (ref 26.0–34.0)
MCHC: 34.4 g/dL (ref 30.0–36.0)
MCV: 89.6 fL (ref 80.0–100.0)
Platelets: 185 10*3/uL (ref 150–400)
RBC: 5.1 MIL/uL (ref 4.22–5.81)
RDW: 11.9 % (ref 11.5–15.5)
WBC: 6.9 10*3/uL (ref 4.0–10.5)
nRBC: 0 % (ref 0.0–0.2)

## 2020-02-24 LAB — LIPID PANEL
Cholesterol: 133 mg/dL (ref 0–200)
HDL: 30 mg/dL — ABNORMAL LOW (ref >40)
LDL Cholesterol: 74 mg/dL (ref 0–99)
Total CHOL/HDL Ratio: 4.4 RATIO
Triglycerides: 147 mg/dL (ref <150)
VLDL: 29 mg/dL (ref 0–40)

## 2020-02-24 LAB — GLUCOSE, CAPILLARY
Glucose-Capillary: 135 mg/dL — ABNORMAL HIGH (ref 70–99)
Glucose-Capillary: 114 mg/dL — ABNORMAL HIGH (ref 70–99)
Glucose-Capillary: 177 mg/dL — ABNORMAL HIGH (ref 70–99)
Glucose-Capillary: 116 mg/dL — ABNORMAL HIGH (ref 70–99)

## 2020-02-24 LAB — POCT ACTIVATED CLOTTING TIME: Activated Clotting Time: 604 seconds

## 2020-02-24 SURGERY — CORONARY STENT INTERVENTION
Anesthesia: LOCAL

## 2020-02-24 MED ORDER — SODIUM CHLORIDE 0.9 % IV SOLN: 250.0000 mL | INTRAVENOUS | Status: DC | PRN

## 2020-02-24 MED ORDER — NITROGLYCERIN 1 MG/10 ML FOR IR/CATH LAB
INTRA_ARTERIAL | Status: AC
  Filled 2020-02-24: qty 10

## 2020-02-24 MED ORDER — BIVALIRUDIN TRIFLUOROACETATE 250 MG IV SOLR
INTRAVENOUS | Status: AC
  Filled 2020-02-24: qty 250

## 2020-02-24 MED ORDER — LIDOCAINE HCL (PF) 1 % IJ SOLN
INTRAMUSCULAR | Status: DC | PRN
  Administered 2020-02-24: 10 mL

## 2020-02-24 MED ORDER — HEPARIN SODIUM (PORCINE) 1000 UNIT/ML IJ SOLN
INTRAMUSCULAR | Status: AC
  Filled 2020-02-24: qty 1

## 2020-02-24 MED ORDER — LIDOCAINE HCL (PF) 1 % IJ SOLN
INTRAMUSCULAR | Status: AC
  Filled 2020-02-24: qty 30

## 2020-02-24 MED ORDER — SODIUM CHLORIDE 0.9% FLUSH
3.0000 mL | Freq: Two times a day (BID) | INTRAVENOUS | Status: DC
  Administered 2020-02-24: 3 mL via INTRAVENOUS

## 2020-02-24 MED ORDER — HEPARIN (PORCINE) IN NACL 1000-0.9 UT/500ML-% IV SOLN
INTRAVENOUS | Status: DC | PRN
  Administered 2020-02-24 (×2): 500 mL

## 2020-02-24 MED ORDER — SODIUM CHLORIDE 0.9 % IV SOLN
INTRAVENOUS | Status: DC | PRN
  Administered 2020-02-24 (×2): 1.75 mg/kg/h via INTRAVENOUS

## 2020-02-24 MED ORDER — CLOPIDOGREL BISULFATE 75 MG PO TABS: 75.0000 mg | ORAL_TABLET | Freq: Every day | ORAL | Status: DC

## 2020-02-24 MED ORDER — MORPHINE SULFATE (PF) 2 MG/ML IV SOLN
2.0000 mg | INTRAVENOUS | Status: DC | PRN
  Administered 2020-02-24: 2 mg via INTRAVENOUS
  Filled 2020-02-24: qty 1

## 2020-02-24 MED ORDER — IOHEXOL 350 MG/ML SOLN
INTRAVENOUS | Status: DC | PRN
  Administered 2020-02-24: 140 mL

## 2020-02-24 MED ORDER — MIDAZOLAM HCL 2 MG/2ML IJ SOLN
INTRAMUSCULAR | Status: DC | PRN
  Administered 2020-02-24: 1 mg via INTRAVENOUS
  Administered 2020-02-24: 2 mg via INTRAVENOUS

## 2020-02-24 MED ORDER — SODIUM CHLORIDE 0.9% FLUSH: 3.0000 mL | INTRAVENOUS | Status: DC | PRN

## 2020-02-24 MED ORDER — HEPARIN (PORCINE) IN NACL 1000-0.9 UT/500ML-% IV SOLN
INTRAVENOUS | Status: AC
  Filled 2020-02-24: qty 1000

## 2020-02-24 MED ORDER — FENTANYL CITRATE (PF) 100 MCG/2ML IJ SOLN
INTRAMUSCULAR | Status: DC | PRN
  Administered 2020-02-24: 50 ug via INTRAVENOUS
  Administered 2020-02-24: 25 ug via INTRAVENOUS

## 2020-02-24 MED ORDER — FENTANYL CITRATE (PF) 100 MCG/2ML IJ SOLN
INTRAMUSCULAR | Status: AC
  Filled 2020-02-24: qty 2

## 2020-02-24 MED ORDER — SODIUM CHLORIDE 0.9 % WEIGHT BASED INFUSION
1.0000 mL/kg/h | INTRAVENOUS | Status: DC
  Administered 2020-02-24: 1 mL/kg/h via INTRAVENOUS

## 2020-02-24 MED ORDER — MIDAZOLAM HCL 2 MG/2ML IJ SOLN
INTRAMUSCULAR | Status: AC
  Filled 2020-02-24: qty 2

## 2020-02-24 MED ORDER — SODIUM CHLORIDE 0.9% FLUSH: 3.0000 mL | Freq: Two times a day (BID) | INTRAVENOUS | Status: DC

## 2020-02-24 MED ORDER — NITROGLYCERIN 1 MG/10 ML FOR IR/CATH LAB
INTRA_ARTERIAL | Status: DC | PRN
  Administered 2020-02-24 (×6): 200 ug via INTRACORONARY

## 2020-02-24 MED ORDER — HYDRALAZINE HCL 20 MG/ML IJ SOLN: 10.0000 mg | INTRAMUSCULAR | Status: AC | PRN

## 2020-02-24 MED ORDER — ACETAMINOPHEN 325 MG PO TABS
650.0000 mg | ORAL_TABLET | ORAL | Status: DC | PRN
  Administered 2020-02-24 (×2): 650 mg via ORAL
  Filled 2020-02-24 (×2): qty 2

## 2020-02-24 MED ORDER — SODIUM CHLORIDE 0.9 % WEIGHT BASED INFUSION
3.0000 mL/kg/h | INTRAVENOUS | Status: DC
  Administered 2020-02-24: 3 mL/kg/h via INTRAVENOUS

## 2020-02-24 MED ORDER — SODIUM CHLORIDE 0.9 % IV SOLN: INTRAVENOUS | Status: AC

## 2020-02-24 MED ORDER — DIAZEPAM 5 MG PO TABS
5.0000 mg | ORAL_TABLET | ORAL | Status: DC | PRN
  Administered 2020-02-24 (×2): 5 mg via ORAL
  Filled 2020-02-24 (×2): qty 1

## 2020-02-24 MED ORDER — ATORVASTATIN CALCIUM 80 MG PO TABS: 80.0000 mg | ORAL_TABLET | Freq: Every day | ORAL | Status: DC

## 2020-02-24 MED ORDER — LABETALOL HCL 5 MG/ML IV SOLN: 10.0000 mg | INTRAVENOUS | Status: AC | PRN

## 2020-02-24 MED ORDER — ASPIRIN 81 MG PO CHEW: 81.0000 mg | CHEWABLE_TABLET | Freq: Every day | ORAL | Status: DC

## 2020-02-24 MED ORDER — ONDANSETRON HCL 4 MG/2ML IJ SOLN: 4.0000 mg | Freq: Four times a day (QID) | INTRAMUSCULAR | Status: DC | PRN

## 2020-02-24 SURGICAL SUPPLY — 16 items
BALLN SAPPHIRE 2.0X12 (BALLOONS) ×2
BALLN SAPPHIRE ~~LOC~~ 2.75X12 (BALLOONS) ×2 IMPLANT
BALLOON SAPPHIRE 2.0X12 (BALLOONS) ×1 IMPLANT
CATH VISTA GUIDE 6FR JR4 (CATHETERS) ×2 IMPLANT
CATH VISTA GUIDE 6FR XB3.5 (CATHETERS) ×2 IMPLANT
CATH VISTA GUIDE 6FR XBLAD3.5 (CATHETERS) ×2 IMPLANT
KIT ENCORE 26 ADVANTAGE (KITS) ×2 IMPLANT
KIT HEART LEFT (KITS) ×2 IMPLANT
PACK CARDIAC CATHETERIZATION (CUSTOM PROCEDURE TRAY) ×2 IMPLANT
SHEATH PINNACLE 6F 10CM (SHEATH) ×2 IMPLANT
STENT RESOLUTE ONYX 2.0X15 (Permanent Stent) ×2 IMPLANT
STENT RESOLUTE ONYX 2.5X15 (Permanent Stent) ×2 IMPLANT
TRANSDUCER W/STOPCOCK (MISCELLANEOUS) ×2 IMPLANT
TUBING CIL FLEX 10 FLL-RA (TUBING) ×2 IMPLANT
WIRE COUGAR XT STRL 190CM (WIRE) ×2 IMPLANT
WIRE EMERALD 3MM-J .035X150CM (WIRE) ×2 IMPLANT

## 2020-02-24 NOTE — Progress Notes (Signed)
Removed 6 french arterial sheath from right femoral artery. Manual pressure held for 50 minutes; bedrest begins at 1330 x 4 hours. Right groin site is level 0. Gauze and tegaderm dressing applied. Right DP palpable post sheath pull. Instructions given to patient. Call bell is within reach.

## 2020-02-24 NOTE — Progress Notes (Signed)
Progress Note  Patient Name: Kenneth Watkins. Date of Encounter: 02/24/2020  [facility] Cardiologist: Rozann Lesches, MD   Subjective   Cath showed prox RCA 90% stenosis and 1st margin 85% stenosis, plan for staged 2Vessel PCI today. Felt chest was sore overnight.    Inpatient Medications    Scheduled Meds: . aspirin  81 mg Oral Daily  . atorvastatin  80 mg Oral Daily  . clopidogrel  75 mg Oral Q breakfast  . diltiazem  240 mg Oral Daily  . sodium chloride flush  3 mL Intravenous Q12H  . sodium chloride flush  3 mL Intravenous Q12H   Continuous Infusions: . sodium chloride    . sodium chloride     PRN Meds: sodium chloride, acetaminophen, diazepam, ondansetron (ZOFRAN) IV, sodium chloride flush   Vital Signs    Vitals:   02/23/20 1439 02/23/20 1539 02/23/20 1953 02/23/20 2224  BP: 127/74 (!) 142/76 (!) 151/82 (!) 149/81  Pulse: 62 64 72   Resp:    18  Temp:   98 F (36.7 C)   TempSrc:   Oral   SpO2: 98% 97% 100%   Weight:      Height:        Intake/Output Summary (Last 24 hours) at 02/24/2020 0633 Last data filed at 02/24/2020 0548 Gross per 24 hour  Intake 525.99 ml  Output 1300 ml  Net -774.01 ml   Last 3 Weights 02/23/2020 02/17/2020 02/08/2020  Weight (lbs) 195 lb 194 lb 197 lb  Weight (kg) 88.451 kg 87.998 kg 89.359 kg      Telemetry    NSR, transient 1st degree AV block, HR 60s - Personally Reviewed  ECG    NSR, 76 bpm, no ischemic changes - Personally Reviewed  Physical Exam   GEN: No acute distress.   Neck: No JVD Cardiac: RRR, no murmurs, rubs, or gallops.  Respiratory: Clear to auscultation bilaterally. GI: Soft, nontender, non-distended  MS: No edema; No deformity. Neuro:  Nonfocal  Psych: Normal affect   Labs    High Sensitivity Troponin:  No results for input(s): TROPONINIHS in the last 720 hours.    Chemistry Recent Labs  Lab 02/24/20 0411  NA 141  K 3.7  CL 107  CO2 25  GLUCOSE 122*  BUN 12  CREATININE 1.24  CALCIUM  8.5*  GFRNONAA 58*  GFRAA >60  ANIONGAP 9     Hematology Recent Labs  Lab 02/24/20 0411  WBC 6.9  RBC 5.10  HGB 15.7  HCT 45.7  MCV 89.6  MCH 30.8  MCHC 34.4  RDW 11.9  PLT 185    BNPNo results for input(s): BNP, PROBNP in the last 168 hours.   DDimer No results for input(s): DDIMER in the last 168 hours.   Radiology    CARDIAC CATHETERIZATION  Result Date: 02/23/2020  Prox RCA lesion is 90% stenosed.  Lat 3rd Mrg lesion is 70% stenosed.  1st Mrg lesion is 85% stenosed.  Mid LAD lesion is 30% stenosed.  Multivessel coronary productive disease with 30% LAD stenosis between the first and second diagonal vessel; large codominant left circumflex coronary artery with 85% stenosis in a large proximal OM1 vessel, 20% AV groove circumflex stenosis with 70% distal stenosis in the OM 2 vessel; codominant RCA with 90% focal stenosis in the region of the RV marginal branch takeoff. Normal LV function with EF estimate at 55%.  LVEDP 13 mmHg. RECOMMENDATION: The patient will be brought back to the laboratory tomorrow  for two-vessel PCI via the femoral approach due to significant angle takeoff with difficulty and catheter kinking via the radial approach.  The patient was loaded with Plavix today.  He will start on 75 mg of Plavix in the morning.  We will hydrate.  Will change simvastatin to atorvastatin 80 mg for more potency statin therapy.  Plan two-vessel PCI tomorrow.    Cardiac Studies   Cardiac cath 02/23/20  Prox RCA lesion is 90% stenosed.  Lat 3rd Mrg lesion is 70% stenosed.  1st Mrg lesion is 85% stenosed.  Mid LAD lesion is 30% stenosed.   Multivessel coronary productive disease with 30% LAD stenosis between the first and second diagonal vessel; large codominant left circumflex coronary artery with 85% stenosis in a large proximal OM1 vessel, 20% AV groove circumflex stenosis with 70% distal stenosis in the OM 2 vessel; codominant RCA with 90% focal stenosis in the region of  the RV marginal branch takeoff.  Normal LV function with EF estimate at 55%.  LVEDP 13 mmHg.  RECOMMENDATION: The patient will be brought back to the laboratory tomorrow for two-vessel PCI via the femoral approach due to significant angle takeoff with difficulty and catheter kinking via the radial approach.  The patient was loaded with Plavix today.  He will start on 75 mg of Plavix in the morning.  We will hydrate.  Will change simvastatin to atorvastatin 80 mg for more potency statin therapy.  Plan two-vessel PCI tomorrow.  Coronary Diagrams  Diagnostic Dominance: Co-dominant   Patient Profile     74 y.o. male with pmh of paroxysmal atrial fibrillation, HTN, HLD, DM2, CKD stage 2-3 who was admitted for cardiac catheterization due to abnormal stress test and dyspnea on exertion.   Assessment & Plan    CAD - This was a planned cath procedure seen by PA-C in the office 02/17/20 . No troponins collected - Cath showed Prox RCA lesion is 90% stenosed, Lat 3rd Mrg lesion is 70% stenosed, 1st Mrg lesion is 85% stenosed. Mid LAD lesion is 30% stenosed. Normal LV function, EF 55% - He was loaded with Plavix - Aspirin  - simvastatin changed to atorvastatin - Plan for staged 2 vessel PCI. Creatinine stable   Paroxysmal Afib - cardizem 240 mg for rate control - Eliquis held for cath  HTN - At baseline on Benzapril 20 mg daily, chlorthalidone 25mg  daily, Cardizem - Cardizem continued on admission - pressures up, 160/87 this AM  HLD - atorvastatin - lipid panel ordered  CKD stage 2-3 - Baseline creatinine 1.2-1.3  For questions or updates, please contact [facility] Please consult [email or web address] for contact info under        Signed, Kimberlyann Hollar Ninfa Meeker, PA-C  02/24/2020, 6:33 AM

## 2020-02-24 NOTE — Interval H&P Note (Signed)
Cath Lab Visit (complete for each Cath Lab visit)  Clinical Evaluation Leading to the Procedure:   ACS: No.  Non-ACS:    Anginal Classification: CCS II  Anti-ischemic medical therapy: Minimal Therapy (1 class of medications)  Non-Invasive Test Results: Low-risk stress test findings: cardiac mortality <1%/year  Prior CABG: No previous CABG      History and Physical Interval Note:  02/24/2020 8:23 AM  [name].  has presented today for surgery, with the diagnosis of cad.  The various methods of treatment have been discussed with the patient and family. After consideration of risks, benefits and other options for treatment, the patient has consented to  Procedure(s): CORONARY STENT INTERVENTION (N/A) as a surgical intervention.  The patient's history has been reviewed, patient examined, no change in status, stable for surgery.  I have reviewed the patient's chart and labs.  Questions were answered to the patient's satisfaction.     Kenneth Watkins

## 2020-02-25 DIAGNOSIS — E785 Hyperlipidemia, unspecified: Secondary | ICD-10-CM

## 2020-02-25 DIAGNOSIS — E118 Type 2 diabetes mellitus with unspecified complications: Secondary | ICD-10-CM

## 2020-02-25 DIAGNOSIS — I10 Essential (primary) hypertension: Secondary | ICD-10-CM

## 2020-02-25 DIAGNOSIS — N182 Chronic kidney disease, stage 2 (mild): Secondary | ICD-10-CM

## 2020-02-25 DIAGNOSIS — I480 Paroxysmal atrial fibrillation: Secondary | ICD-10-CM

## 2020-02-25 LAB — CBC
HCT: 44 % (ref 39.0–52.0)
Hemoglobin: 14.8 g/dL (ref 13.0–17.0)
MCH: 30.6 pg (ref 26.0–34.0)
MCHC: 33.6 g/dL (ref 30.0–36.0)
MCV: 90.9 fL (ref 80.0–100.0)
Platelets: 181 10*3/uL (ref 150–400)
RBC: 4.84 MIL/uL (ref 4.22–5.81)
RDW: 11.9 % (ref 11.5–15.5)
WBC: 8.4 10*3/uL (ref 4.0–10.5)
nRBC: 0 % (ref 0.0–0.2)

## 2020-02-25 LAB — BASIC METABOLIC PANEL
Anion gap: 7 (ref 5–15)
BUN: 12 mg/dL (ref 8–23)
CO2: 24 mmol/L (ref 22–32)
Calcium: 8.4 mg/dL — ABNORMAL LOW (ref 8.9–10.3)
Chloride: 104 mmol/L (ref 98–111)
Creatinine, Ser: 1.2 mg/dL (ref 0.61–1.24)
GFR calc Af Amer: >60 mL/min (ref >60)
GFR calc non Af Amer: >60 mL/min (ref >60)
Glucose, Bld: 117 mg/dL — ABNORMAL HIGH (ref 70–99)
Potassium: 3.6 mmol/L (ref 3.5–5.1)
Sodium: 135 mmol/L (ref 135–145)

## 2020-02-25 LAB — GLUCOSE, CAPILLARY: Glucose-Capillary: 208 mg/dL — ABNORMAL HIGH (ref 70–99)

## 2020-02-25 MED ORDER — NITROGLYCERIN 0.4 MG SL SUBL
0.4000 mg | SUBLINGUAL_TABLET | SUBLINGUAL | 1 refills | Status: DC | PRN
Start: 2020-02-25 — End: 2023-03-04

## 2020-02-25 MED ORDER — ATORVASTATIN CALCIUM 80 MG PO TABS: 80.0000 mg | ORAL_TABLET | Freq: Every day | ORAL | 3 refills | Status: DC

## 2020-02-25 MED ORDER — CLOPIDOGREL BISULFATE 75 MG PO TABS: 75.0000 mg | ORAL_TABLET | Freq: Every day | ORAL | 3 refills | Status: DC

## 2020-02-25 MED ORDER — ASPIRIN 81 MG PO CHEW: 81.0000 mg | CHEWABLE_TABLET | Freq: Every day | ORAL | 1 refills | Status: DC

## 2020-02-25 NOTE — Progress Notes (Signed)
CARDIAC REHAB PHASE I   PRE:  Rate/Rhythm: 90 SR with PVCs  BP:  Sitting: 165/61      SaO2: 97 RA  MODE:  Ambulation: 450 ft   POST:  Rate/Rhythm: 92 SR  BP:  Sitting: 185/90    SaO2: 96 RA  Pt ambulated 433ft in hallway independently with steady gait. Pt denies CP, SOB, or dizziness. Pt returned to recliner. Stent education completed with pt. Pt educated on importance ASA, Plavix, Eliquis, and statin. Pt given stent card along with heart healthy and diabetic diets. Reviewed restrictions, site care, and exercise guidelines. Will refer to CRP II [place].   [id number] Rufina Falco, RN BSN 02/25/2020 8:42 AM

## 2020-02-25 NOTE — Discharge Summary (Addendum)
Discharge Summary    Patient ID: Kenneth Watkins. MRN: [id number]; DOB: Feb 24, 1946  Admit date: 02/23/2020 Discharge date: 02/25/2020  Primary Care Provider: Lemmie Evens, MD  Primary Cardiologist: Rozann Lesches, MD  Primary Electrophysiologist:  None   Discharge Diagnoses    Principal Problem:   Abnormal nuclear stress test Active Problems:   Exertional dyspnea   CAD (coronary artery disease), native coronary artery;DES to OM1 LCX, DES to mid RCA 02/24/20   Hyperlipidemia   Paroxysmal A-fib ([place])   Hypertension   CKD (chronic kidney disease) stage 2, GFR 60-89 ml/min   Type 2 diabetes mellitus with complication, without long-term current use of insulin (HCC)   Diagnostic Studies/Procedures    Cardiac catheterization 02/23/2020  Prox RCA lesion is 90% stenosed.  Lat 3rd Mrg lesion is 70% stenosed.  1st Mrg lesion is 85% stenosed.  Mid LAD lesion is 30% stenosed.   Multivessel coronary productive disease with 30% LAD stenosis between the first and second diagonal vessel; large codominant left circumflex coronary artery with 85% stenosis in a large proximal OM1 vessel, 20% AV groove circumflex stenosis with 70% distal stenosis in the OM 2 vessel; codominant RCA with 90% focal stenosis in the region of the RV marginal branch takeoff.  Normal LV function with EF estimate at 55%.  LVEDP 13 mmHg.  RECOMMENDATION: The patient will be brought back to the laboratory tomorrow for two-vessel PCI via the femoral approach due to significant angle takeoff with difficulty and catheter kinking via the radial approach.  The patient was loaded with Plavix today.  He will start on 75 mg of Plavix in the morning.  We will hydrate.  Will change simvastatin to atorvastatin 80 mg for more potency statin therapy.  Plan two-vessel PCI tomorrow.  Coronary Diagrams  Diagnostic Dominance: Co-dominant     Cardiac catheterization 02/24/2020  Mid LAD lesion is 30% stenosed.  Lat 3rd Mrg lesion  is 70% stenosed.  1st Mrg lesion is 85% stenosed.  Prox RCA lesion is 90% stenosed.  Post intervention, there is a 0% residual stenosis.  Post intervention, there is a 0% residual stenosis.  A stent was successfully placed.  A stent was successfully placed.   Successful two-vessel coronary intervention with insertion of a 2.5 x 15 mm Resolute Onyx DES stent postdilated to 2.75 mm in the OM1 vessel of the left circumflex coronary artery with the 85% stenosis being reduced to 0%; and the 90% mid RCA stenosis treated with a 2.0 x 15 mm Resolute stent postdilated to 2.25 mm with the 90% stenosis being reduced to 0%.  RECOMMENDATION: The patient will be hydrated overnight.  Reinstitute Eliquis 5 mg twice a day tomorrow with his history of PAF.  Triple drug therapy with aspirin 81 mg/Plavix 75 mg daily and Eliquis for 1 month; then DC aspirin with Plavix/Eliquis for 6 months probably then can DC Plavix and possibly resume aspirin 81 mg if no contraindication.  Medical therapy for mild concomitant CAD.  Aggressive lipid-lowering therapy with LDL cholesterol 60 or below.  Consider possible sleep evaluation with the patient's PAF history.  Intervention      _____________   History of Present Illness     Kenneth Watkins. is a 74 y.o. male with past medical history of paroxysmal atrial fibrillation on Eliquis, hypertension, hyperlipidemia, type 2 diabetes, CKD stage II-III was seen in the office for follow-up of a stress test.  Prior visit patient had reported occasional palpitations and worsening dyspnea on exertion with diaphoresis.  Therefore stress test was ordered which showed a small defect of moderate severity present in the mid inferior and apical inferior location and a mild degree of scar cannot be entirely excluded.  Overall this was a low risk study but given his symptoms and family history of premature CAD close follow-up was arranged to discuss a cardiac catheterization to which the  patient agreed.  Request was held for the procedure.  Hospital Course     Consultants: None  The patient arrived to [facility] 02/17/2020 for his scheduled cardiac catheterization.  The patient was taken to the Cath Lab and right radial access was established.  Cath showed multivessel coronary productive disease with 30% LAD stenosis, first marginal lesion 85% with stenosis, lateral third marginal lesion with 70% stenosis, proximal RCA lesion with 90% stenosis; normal LV function with EF estimated at 55%, LVEDP 13 mmHg.  Due to the significant angle takeoff with difficulty and catheter kinking via the radial approach plan for staged two-vessel PCI via the femoral approach was scheduled next day.  Simvastatin was changed to atorvastatin 80 mg.  Patient remained stable overnight.  He was brought back the next day and underwent successful two-vessel coronary intervention with DES to OM1 vessel of the left circumflex coronary artery and DES to mid RCA.  Patient was kept overnight for hydration.  Eliquis 5 mg twice daily was started the next day.  Plan with DAPT with aspirin and Plavix along with Eliquis for 1 month, then discontinue aspirin and continue with Plavix and Eliquis for 6 months.  Plan for continued medical therapy for residual CAD with aggressive lipid lower therapy.  Cardiac cath site remained stable with no complications.  Patient worked with cardiac rehab and was able to ambulate without chest pain or sob. Follow-up labs revealed creatinine of 1.2 and hemoglobin 14.8.  Will send in new prescriptions for SL nitro, atorvastatin 80 mg, aspirin 81 mg and Plavix.  Patient was evaluated by Dr. Audie Box on 02/25/2020 and felt to be stable for discharge.  GEN:No acute distress.   Neck:No JVD Cardiac:RRR, no murmurs, rubs, or gallops.  Respiratory:Clear to auscultation bilaterally. NL:GXQJ, nontender, non-distended  MS:No edema; No deformity; Right radial and groin cath site stable, groin  with minimal bruising, no firmness Neuro:Nonfocal  Psych: Normal affect   Did the patient have an acute coronary syndrome (MI, NSTEMI, STEMI, etc) this admission?:  No                               Did the patient have a percutaneous coronary intervention (stent / angioplasty)?:  Yes.     Cath/PCI Registry Performance & Quality Measures: 1. Aspirin prescribed? - Yes 2. ADP Receptor Inhibitor (Plavix/Clopidogrel, Brilinta/Ticagrelor or Effient/Prasugrel) prescribed (includes medically managed patients)? - Yes 3. High Intensity Statin (Lipitor 40-80mg  or Crestor 20-40mg ) prescribed? - Yes 4. For EF <40%, was ACEI/ARB prescribed? - Not Applicable (EF >/= 19%) 5. For EF <40%, Aldosterone Antagonist (Spironolactone or Eplerenone) prescribed? - Not Applicable (EF >/= 41%) 6. Cardiac Rehab Phase II ordered? - Yes   _____________  Discharge Vitals Blood pressure (!) 164/89, pulse 72, temperature 97.9 F (36.6 C), temperature source Oral, resp. rate 18, height 6\' 1"  (1.854 m), weight 85.6 kg, SpO2 100 %.  Filed Weights   02/23/20 0705 02/25/20 0641  Weight: 88.5 kg 85.6 kg    Labs & Radiologic Studies    CBC Recent Labs    02/24/20  0411 02/25/20 0350  WBC 6.9 8.4  HGB 15.7 14.8  HCT 45.7 44.0  MCV 89.6 90.9  PLT 185 709   Basic Metabolic Panel Recent Labs    02/24/20 0411 02/25/20 0350  NA 141 135  K 3.7 3.6  CL 107 104  CO2 25 24  GLUCOSE 122* 117*  BUN 12 12  CREATININE 1.24 1.20  CALCIUM 8.5* 8.4*   Liver Function Tests No results for input(s): AST, ALT, ALKPHOS, BILITOT, PROT, ALBUMIN in the last 72 hours. No results for input(s): LIPASE, AMYLASE in the last 72 hours. High Sensitivity Troponin:   No results for input(s): TROPONINIHS in the last 720 hours.  BNP Invalid input(s): POCBNP D-Dimer No results for input(s): DDIMER in the last 72 hours. Hemoglobin A1C No results for input(s): HGBA1C in the last 72 hours. Fasting Lipid Panel Recent Labs     02/24/20 0411  CHOL 133  HDL 30*  LDLCALC 74  TRIG 147  CHOLHDL 4.4   Thyroid Function Tests No results for input(s): TSH, T4TOTAL, T3FREE, THYROIDAB in the last 72 hours.  Invalid input(s): FREET3 _____________  CARDIAC CATHETERIZATION  Result Date: 02/24/2020  Mid LAD lesion is 30% stenosed.  Lat 3rd Mrg lesion is 70% stenosed.  1st Mrg lesion is 85% stenosed.  Prox RCA lesion is 90% stenosed.  Post intervention, there is a 0% residual stenosis.  Post intervention, there is a 0% residual stenosis.  A stent was successfully placed.  A stent was successfully placed.  Successful two-vessel coronary intervention with insertion of a 2.5 x 15 mm Resolute Onyx DES stent postdilated to 2.75 mm in the OM1 vessel of the left circumflex coronary artery with the 85% stenosis being reduced to 0%; and the 90% mid RCA stenosis treated with a 2.0 x 15 mm Resolute stent postdilated to 2.25 mm with the 90% stenosis being reduced to 0%. RECOMMENDATION: The patient will be hydrated overnight.  Reinstitute Eliquis 5 mg twice a day tomorrow with his history of PAF.  Triple drug therapy with aspirin 81 mg/Plavix 75 mg daily and Eliquis for 1 month; then DC aspirin with Plavix/Eliquis for 6 months probably then can DC Plavix and possibly resume aspirin 81 mg if no contraindication.  Medical therapy for mild concomitant CAD.  Aggressive lipid-lowering therapy with LDL cholesterol 60 or below.  Consider possible sleep evaluation with the patient's PAF history.   CARDIAC CATHETERIZATION  Result Date: 02/24/2020  Prox RCA lesion is 90% stenosed.  Lat 3rd Mrg lesion is 70% stenosed.  1st Mrg lesion is 85% stenosed.  Mid LAD lesion is 30% stenosed.  Multivessel coronary productive disease with 30% LAD stenosis between the first and second diagonal vessel; large codominant left circumflex coronary artery with 85% stenosis in a large proximal OM1 vessel, 20% AV groove circumflex stenosis with 70% distal stenosis in  the OM 2 vessel; codominant RCA with 90% focal stenosis in the region of the RV marginal branch takeoff. Normal LV function with EF estimate at 55%.  LVEDP 13 mmHg. RECOMMENDATION: The patient will be brought back to the laboratory tomorrow for two-vessel PCI via the femoral approach due to significant angle takeoff with difficulty and catheter kinking via the radial approach.  The patient was loaded with Plavix today.  He will start on 75 mg of Plavix in the morning.  We will hydrate.  Will change simvastatin to atorvastatin 80 mg for more potency statin therapy.  Plan two-vessel PCI tomorrow.   NM Myocar Multi W/Spect  W/Wall Motion / EF  Result Date: 02/12/2020  There was no ST segment deviation noted during stress.  Defect 1: There is a small defect of moderate severity present in the mid inferior and apical inferior location. While a mild degree of scar cannot entirely be excluded, soft tissue attneuation is also contributing to defect. No ischemic territories.  This is a low risk study.  Nuclear stress EF: 58%.    Disposition   Pt is being discharged home today in good condition.  Follow-up Plans & Appointments     Follow-up Information    Erma Heritage, PA-C Follow up on 03/11/2020.   Specialties: Librarian, academic, Cardiology Why: @ 2PM Contact information: [place] [id number] [phone]              Discharge Instructions    Amb Referral to Cardiac Rehabilitation   Complete by: As directed    Diagnosis: Coronary Stents   After initial evaluation and assessments completed: Virtual Based Care may be provided alone or in conjunction with Phase 2 Cardiac Rehab based on patient barriers.: Yes      Discharge Medications   Allergies as of 02/25/2020      Reactions   Codeine Nausea And Vomiting   vomiting      Medication List    STOP taking these medications   simvastatin 40 MG tablet Commonly known as: ZOCOR   tadalafil 20 MG  tablet Commonly known as: CIALIS     TAKE these medications   Accu-Chek Softclix Lancets lancets 1 each by Other route as needed.   aspirin 81 MG chewable tablet Chew 1 tablet (81 mg total) by mouth daily. Start taking on: February 26, 2020   atorvastatin 80 MG tablet Commonly known as: LIPITOR Take 1 tablet (80 mg total) by mouth daily. Start taking on: February 26, 2020   B-D 3CC LUER-LOK SYR 22GX1" 22G X 1" 3 ML Misc Generic drug: SYRINGE-NEEDLE (DISP) 3 ML 1 each by Other route as needed.   BD Disp Needles 18G X 1-1/2" Misc Generic drug: NEEDLE (DISP) 18 G 1 each by Other route as needed.   benazepril 20 MG tablet Commonly known as: LOTENSIN Take 20 mg by mouth daily.   chlorproMAZINE 25 MG tablet Commonly known as: THORAZINE Take 1 tablet (25 mg total) by mouth 3 (three) times daily as needed. For hiccups   chlorthalidone 25 MG tablet Commonly known as: HYGROTON Take 25 mg by mouth daily.   clopidogrel 75 MG tablet Commonly known as: PLAVIX Take 1 tablet (75 mg total) by mouth daily with breakfast. Start taking on: February 26, 2020   clotrimazole-betamethasone cream Commonly known as: LOTRISONE Apply 1 application topically daily as needed (Rash).   CO Q 10 PO Take 500 mg by mouth daily.   Contour Next Test test strip Generic drug: glucose blood 1 each by Other route daily.   Depo-Testosterone 200 MG/ML injection Generic drug: testosterone cypionate Inject 200 mg into the muscle every 14 (fourteen) days.   diltiazem 240 MG 24 hr capsule Commonly known as: DILACOR XR Take 240 mg by mouth daily.   Eliquis 5 MG Tabs tablet Generic drug: apixaban Take 5 mg by mouth 2 (two) times daily.   MENS PROSTATE HEALTH FORMULA PO Take 1 tablet by mouth daily. [employer]   methocarbamol 500 MG tablet Commonly known as: ROBAXIN Take 500 mg by mouth daily as needed for muscle spasms.   multivitamin with minerals tablet Take 1  tablet by mouth daily. Men's    nitroGLYCERIN 0.4 MG SL tablet Commonly known as: Nitrostat Place 1 tablet (0.4 mg total) under the tongue every 5 (five) minutes as needed for chest pain.   OSTEO BI-FLEX ONE PER DAY PO Take 1 tablet by mouth daily.          Outstanding Labs/Studies   None  Duration of Discharge Encounter   Greater than 30 minutes including physician time.  Signed, Marwan Lipe Ninfa Meeker, PA-C 02/25/2020, 10:04 AM

## 2020-03-07 ENCOUNTER — Telehealth: Payer: Self-pay | Admitting: Cardiology

## 2020-03-07 NOTE — Telephone Encounter (Signed)
Patient reported feeling a firm area in in groin site right in the crease of his leg which he had never felt before. He is 1 2 days post cath. Denies pain.Has fu apt this week and can be examined then.He had two stents placed and has also and some chest pain initially which is now better.

## 2020-03-07 NOTE — Telephone Encounter (Signed)
Pt has questions WP:VXYIA   Please call [phone]  Thanks renee

## 2020-03-10 ENCOUNTER — Ambulatory Visit: Payer: 59 | Admitting: Student

## 2020-03-11 ENCOUNTER — Other Ambulatory Visit: Payer: Self-pay

## 2020-03-11 ENCOUNTER — Ambulatory Visit (INDEPENDENT_AMBULATORY_CARE_PROVIDER_SITE_OTHER): Payer: Medicare Other | Admitting: Student

## 2020-03-11 ENCOUNTER — Encounter: Payer: Self-pay | Admitting: Student

## 2020-03-11 ENCOUNTER — Other Ambulatory Visit (HOSPITAL_COMMUNITY)
Admission: RE | Admit: 2020-03-11 | Discharge: 2020-03-11 | Disposition: A | Discharge status: Home / Self Care | Payer: Medicare Other | Source: Ambulatory Visit | Attending: Student | Admitting: Student

## 2020-03-11 VITALS — BP 136/70 | HR 62 | Ht 73.0 in | Wt 193.4 lb

## 2020-03-11 DIAGNOSIS — Z79899 Other long term (current) drug therapy: Secondary | ICD-10-CM | POA: Diagnosis not present

## 2020-03-11 DIAGNOSIS — I480 Paroxysmal atrial fibrillation: Secondary | ICD-10-CM | POA: Diagnosis not present

## 2020-03-11 DIAGNOSIS — R0600 Dyspnea, unspecified: Secondary | ICD-10-CM

## 2020-03-11 DIAGNOSIS — R0609 Other forms of dyspnea: Secondary | ICD-10-CM

## 2020-03-11 DIAGNOSIS — E782 Mixed hyperlipidemia: Secondary | ICD-10-CM

## 2020-03-11 DIAGNOSIS — N182 Chronic kidney disease, stage 2 (mild): Secondary | ICD-10-CM

## 2020-03-11 DIAGNOSIS — I10 Essential (primary) hypertension: Secondary | ICD-10-CM

## 2020-03-11 DIAGNOSIS — I2510 Atherosclerotic heart disease of native coronary artery without angina pectoris: Secondary | ICD-10-CM

## 2020-03-11 LAB — BASIC METABOLIC PANEL
Anion gap: 12 (ref 5–15)
BUN: 18 mg/dL (ref 8–23)
CO2: 26 mmol/L (ref 22–32)
Calcium: 9.5 mg/dL (ref 8.9–10.3)
Chloride: 94 mmol/L — ABNORMAL LOW (ref 98–111)
Creatinine, Ser: 1.28 mg/dL — ABNORMAL HIGH (ref 0.61–1.24)
GFR calc Af Amer: >60 mL/min (ref >60)
GFR calc non Af Amer: 56 mL/min — ABNORMAL LOW (ref >60)
Glucose, Bld: 124 mg/dL — ABNORMAL HIGH (ref 70–99)
Potassium: 3.8 mmol/L (ref 3.5–5.1)
Sodium: 132 mmol/L — ABNORMAL LOW (ref 135–145)

## 2020-03-11 LAB — SEDIMENTATION RATE: Sed Rate: 3 mm/hr (ref 0–16)

## 2020-03-11 LAB — BRAIN NATRIURETIC PEPTIDE: B Natriuretic Peptide: 33 pg/mL (ref 0.0–100.0)

## 2020-03-11 NOTE — Patient Instructions (Signed)
Medication Instructions:  Your physician recommends that you continue on your current medications as directed. Please refer to the Current Medication list given to you today.  *If you need a refill on your cardiac medications before your next appointment, please call your pharmacy*   Lab Work: Your physician recommends that you return for lab work in: Today   If you have labs (blood work) drawn today and your tests are completely normal, you will receive your results only by: MyChart Message (if you have MyChart) OR A paper copy in the mail If you have any lab test that is abnormal or we need to change your treatment, we will call you to review the results.   Testing/Procedures: NONE    Follow-Up: At [facility], you and your health needs are our priority.  As part of our continuing mission to provide you with exceptional heart care, we have created designated Provider Care Teams.  These Care Teams include your primary Cardiologist (physician) and Advanced Practice Providers (APPs -  Physician Assistants and Nurse Practitioners) who all work together to provide you with the care you need, when you need it.  We recommend signing up for the patient portal called "MyChart".  Sign up information is provided on this After Visit Summary.  MyChart is used to connect with patients for Virtual Visits (Telemedicine).  Patients are able to view lab/test results, encounter notes, upcoming appointments, etc.  Non-urgent messages can be sent to your provider as well.   To learn more about what you can do with MyChart, go to [email or web address].    Your next appointment:   3 month(s)  The format for your next appointment:   In Person  Provider:   Samuel McDowell, MD   Other Instructions Thank you for choosing [facility]!    

## 2020-03-11 NOTE — Progress Notes (Signed)
Cardiology Office Note    Date:  03/12/2020   ID:  Kenneth Graefe., DOB 01/05/1946, MRN [id number]  PCP:  Lemmie Evens, MD  Cardiologist: Rozann Lesches, MD    Chief Complaint  Patient presents with  . Follow-up    s/p cardiac catheterization    History of Present Illness:    Kenneth Watkins. is a 74 y.o. male with past medical history of paroxysmal atrial fibrillation, HTN, HLD, Type 2 DM, and Stage 2-3 CKD who presents to the office today for follow-up from his recent cardiac catheterization.  He was last examined by myself earlier this month after recent stress test had shown a mild degree of scar. Given that he was still having intermittent dyspnea on exertion and with his multiple cardiac risk factors, a cardiac catheterization was recommended for definitive evaluation. This was performed by Dr. Claiborne Billings on 02/23/2020 and showed multivessel CAD with 30% mid-LAD, 90% Prox-RCA, 70% Lateral 3rd Mrg and 85% 1st Mrg. It was recommended he come back to the cath lab the following morning for 2-vessel PCI. He underwent DES to the OM1 and DES to the mid-RCA. It was recommended he be on triple therapy with Aspirin 81 mg, Plavix 75 mg daily and Eliquis for 1 month then discontinue aspirin with Plavix/Eliquis for 6 months then could probably discontinue Plavix and resume Aspirin 81 mg if no contraindication. Simvastatin was changed to Atorvastatin 80mg  daily with plans for repeat FLP and LFT's in 6-8 weerks.   In talking with the patient today, he reports his dyspnea on exertion has improved since stent placement and he has been walking for over 2 miles a day for the past several days. He does report feeling more aware of his breathing and reports he has a discomfort with inspiration at times. Says he received a significant amount of IVF during admission. He is unsure if his breathing is worse after he takes his medications. He sometimes develops left precordial pain at night while sleeping but denies  any exertional pain. No recent orthopnea, PND or edema. He did experience significant bruising along his right groin site following his cath but this has since improved.    Past Medical History:  Diagnosis Date  . BPH (benign prostatic hyperplasia)   . CAD (coronary artery disease)    a. s/p DES to OM1 and DES to RCA in 02/2020  . CKD (chronic kidney disease) stage 3, GFR 30-59 ml/min   . Hyperlipidemia   . Hypertension   . Persistent atrial fibrillation ([place])   . Pinched nerve in neck   . Type 2 diabetes mellitus ([place])     Past Surgical History:  Procedure Laterality Date  . CORONARY STENT INTERVENTION N/A 02/24/2020   Procedure: CORONARY STENT INTERVENTION;  Surgeon: Troy Sine, MD;  Location: [facility];  Service: Cardiovascular;  Laterality: N/A;  . EYE SURGERY    . LEFT HEART CATH AND CORONARY ANGIOGRAPHY N/A 02/23/2020   Procedure: LEFT HEART CATH AND CORONARY ANGIOGRAPHY;  Surgeon: Troy Sine, MD;  Location: [facility];  Service: Cardiovascular;  Laterality: N/A;  . TONSILLECTOMY    . VASECTOMY      Current Medications: Outpatient Medications Prior to Visit  Medication Sig Dispense Refill  . Accu-Chek Softclix Lancets lancets 1 each by Other route as needed.    Marland Kitchen apixaban (ELIQUIS) 5 MG TABS tablet Take 5 mg by mouth 2 (two) times daily.    Marland Kitchen aspirin 81 MG chewable tablet  Chew 1 tablet (81 mg total) by mouth daily. 60 tablet 1  . atorvastatin (LIPITOR) 80 MG tablet Take 1 tablet (80 mg total) by mouth daily. 90 tablet 3  . B-D 3CC LUER-LOK SYR 22GX1" 22G X 1" 3 ML MISC 1 each by Other route as needed.    . BD DISP NEEDLES 18G X 1-1/2" MISC 1 each by Other route as needed.    . benazepril (LOTENSIN) 20 MG tablet Take 20 mg by mouth daily.    . [place]-Glucosamine-Vit D (OSTEO BI-FLEX ONE PER DAY PO) Take 1 tablet by mouth daily.    . chlorproMAZINE (THORAZINE) 25 MG tablet Take 1 tablet (25 mg total) by mouth 3 (three) times daily as needed. For  hiccups 12 tablet 0  . chlorthalidone (HYGROTON) 25 MG tablet Take 25 mg by mouth daily.    . clopidogrel (PLAVIX) 75 MG tablet Take 1 tablet (75 mg total) by mouth daily with breakfast. 90 tablet 3  . clotrimazole-betamethasone (LOTRISONE) cream Apply 1 application topically daily as needed (Rash).     . Coenzyme Q10 (CO Q 10 PO) Take 500 mg by mouth daily.    . CONTOUR NEXT TEST test strip 1 each by Other route daily.    . methocarbamol (ROBAXIN) 500 MG tablet Take 500 mg by mouth daily as needed for muscle spasms.     . Misc Natural Products (MENS PROSTATE HEALTH FORMULA PO) Take 1 tablet by mouth daily. Real Health    . Multiple Vitamins-Minerals (MULTIVITAMIN WITH MINERALS) tablet Take 1 tablet by mouth daily. Men's    . nitroGLYCERIN (NITROSTAT) 0.4 MG SL tablet Place 1 tablet (0.4 mg total) under the tongue every 5 (five) minutes as needed for chest pain. 25 tablet 1  . testosterone cypionate (DEPO-TESTOSTERONE) 200 MG/ML injection Inject 200 mg into the muscle every 14 (fourteen) days.     . verapamil (CALAN) 80 MG tablet Take 80 mg by mouth 3 (three) times daily.    Marland Kitchen diltiazem (DILACOR XR) 240 MG 24 hr capsule Take 240 mg by mouth daily.     No facility-administered medications prior to visit.     Allergies:   Codeine   Social History   Socioeconomic History  . Marital status: Married    Spouse name: Not on file  . Number of children: Not on file  . Years of education: Not on file  . Highest education level: Not on file  Occupational History  . Occupation: Pharmacologist  Tobacco Use  . Smoking status: Never Smoker  . Smokeless tobacco: Never Used  Vaping Use  . Vaping Use: Never used  Substance and Sexual Activity  . Alcohol use: No  . Drug use: No  . Sexual activity: Not on file  Other Topics Concern  . Not on file  Social History Narrative  . Not on file   Social Determinants of Health   Financial Resource Strain:   . Difficulty of Paying Living  Expenses:   Food Insecurity:   . Worried About Charity fundraiser in the Last Year:   . Arboriculturist in the Last Year:   Transportation Needs:   . Film/video editor (Medical):   Marland Kitchen Lack of Transportation (Non-Medical):   Physical Activity:   . Days of Exercise per Week:   . Minutes of Exercise per Session:   Stress:   . Feeling of Stress :   Social Connections:   . Frequency of Communication with Friends  and Family:   . Frequency of Social Gatherings with Friends and Family:   . Attends Religious Services:   . Active Member of Clubs or Organizations:   . Attends Archivist Meetings:   Marland Kitchen Marital Status:      Family History:  The patient's family history includes Diabetes in his mother; Heart attack in his father; Stroke in his father and mother; Uterine cancer in his mother.   Review of Systems:   Please see the history of present illness.     General:  No chills, fever, night sweats or weight changes.  Cardiovascular:  No exertional chest pain, edema, orthopnea, palpitations, paroxysmal nocturnal dyspnea. Positive for dyspnea.  Dermatological: No rash, lesions/masses Respiratory: No cough.  Urologic: No hematuria, dysuria Abdominal:   No nausea, vomiting, diarrhea, bright red blood per rectum, melena, or hematemesis Neurologic:  No visual changes, wkns, changes in mental status. All other systems reviewed and are otherwise negative except as noted above.   Physical Exam:    VS:  BP 136/70   Pulse 62   Ht 6\' 1"  (1.854 m)   Wt 193 lb 6.4 oz (87.7 kg)   SpO2 98%   BMI 25.52 kg/m    General: Well developed, well nourished,male appearing in no acute distress. Head: Normocephalic, atraumatic, sclera non-icteric.  Neck: No carotid bruits. JVD not elevated.  Lungs: Respirations regular and unlabored, without wheezes or rales.  Heart: Regular rate and rhythm. No S3 or S4.  No murmur, no rubs, or gallops appreciated. Abdomen: Soft, non-tender, non-distended.  No obvious abdominal masses. Msk:  Strength and tone appear normal for age. No obvious joint deformities or effusions. Extremities: No clubbing or cyanosis. No lower extremity edema.  Distal pedal pulses are 2+ bilaterally. Cath sites stable without ecchymosis or evidence of a hematoma.  Neuro: Alert and oriented X 3. Moves all extremities spontaneously. No focal deficits noted. Psych:  Responds to questions appropriately with a normal affect. Skin: No rashes or lesions noted  Wt Readings from Last 3 Encounters:  03/11/20 193 lb 6.4 oz (87.7 kg)  02/25/20 188 lb 12.8 oz (85.6 kg)  02/17/20 194 lb (88 kg)      Studies/Labs Reviewed:   EKG:  EKG is not ordered today. EKG from 02/25/2020 is reviewed which shows NSR, HR 67 with 1st degree AV Block. No acute ST abnormalities when compared to prior tracings.   Recent Labs: 02/25/2020: Hemoglobin 14.8; Platelets 181 03/11/2020: B Natriuretic Peptide 33.0; BUN 18; Creatinine, Ser 1.28; Potassium 3.8; Sodium 132   Lipid Panel    Component Value Date/Time   CHOL 133 02/24/2020 0411   TRIG 147 02/24/2020 0411   HDL 30 (L) 02/24/2020 0411   CHOLHDL 4.4 02/24/2020 0411   VLDL 29 02/24/2020 0411   LDLCALC 74 02/24/2020 0411    Additional studies/ records that were reviewed today include:   Cardiac Catheterization: 02/2020  Prox RCA lesion is 90% stenosed.  Lat 3rd Mrg lesion is 70% stenosed.  1st Mrg lesion is 85% stenosed.  Mid LAD lesion is 30% stenosed.   Multivessel coronary productive disease with 30% LAD stenosis between the first and second diagonal vessel; large codominant left circumflex coronary artery with 85% stenosis in a large proximal OM1 vessel, 20% AV groove circumflex stenosis with 70% distal stenosis in the OM 2 vessel; codominant RCA with 90% focal stenosis in the region of the RV marginal branch takeoff.  Normal LV function with EF estimate at 55%.  LVEDP 13  mmHg.  RECOMMENDATION: The patient will be brought  back to the laboratory tomorrow for two-vessel PCI via the femoral approach due to significant angle takeoff with difficulty and catheter kinking via the radial approach.  The patient was loaded with Plavix today.  He will start on 75 mg of Plavix in the morning.  We will hydrate.  Will change simvastatin to atorvastatin 80 mg for more potency statin therapy.  Plan two-vessel PCI tomorrow.   Coronary Stent Intervention: 02/2020  Mid LAD lesion is 30% stenosed.  Lat 3rd Mrg lesion is 70% stenosed.  1st Mrg lesion is 85% stenosed.  Prox RCA lesion is 90% stenosed.  Post intervention, there is a 0% residual stenosis.  Post intervention, there is a 0% residual stenosis.  A stent was successfully placed.  A stent was successfully placed.   Successful two-vessel coronary intervention with insertion of a 2.5 x 15 mm Resolute Onyx DES stent postdilated to 2.75 mm in the OM1 vessel of the left circumflex coronary artery with the 85% stenosis being reduced to 0%; and the 90% mid RCA stenosis treated with a 2.0 x 15 mm Resolute stent postdilated to 2.25 mm with the 90% stenosis being reduced to 0%.  RECOMMENDATION: The patient will be hydrated overnight.  Reinstitute Eliquis 5 mg twice a day tomorrow with his history of PAF.  Triple drug therapy with aspirin 81 mg/Plavix 75 mg daily and Eliquis for 1 month; then DC aspirin with Plavix/Eliquis for 6 months probably then can DC Plavix and possibly resume aspirin 81 mg if no contraindication.  Medical therapy for mild concomitant CAD.  Aggressive lipid-lowering therapy with LDL cholesterol 60 or below.  Consider possible sleep evaluation with the patient's PAF history.    Assessment:    1. Coronary artery disease involving native coronary artery of native heart without angina pectoris   2. Dyspnea on exertion   3. PAF (paroxysmal atrial fibrillation) ([place])   4. Medication management   5. Essential hypertension   6. Mixed hyperlipidemia   7.  CKD (chronic kidney disease) stage 2, GFR 60-89 ml/min      Plan:   In order of problems listed above:  1. CAD/Intermittent Dyspnea - Recent cardiac catheterization showed multivessel CAD with 30% mid-LAD, 90% Prox-RCA, 70% Lateral 3rd Mrg and 85% 1st Mrg. He is now s/p DES to the OM1 and DES to the mid-RCA. - In regards to DAPT, he will be on triple therapy with Aspirin 81 mg, Plavix 75 mg daily and Eliquis for 1 month then discontinue aspirin (stop ASA on 03/25/2020). Will then be on Plavix and Eliquis for 6 months then could probably discontinue Plavix and resume Aspirin 81 mg if no contraindication by review of Interventional Cardiology notes. Continue statin therapy and ACE-I.  - He does report intermittent dyspnea since his procedure which can occur sporadically. Walking for over 2 miles without symptoms though. He received a significant amount of IVF during admission but does not appear volume overloaded by examination. Will check a BNP today along with Sed rate given some chest discomfort when supine.    2. Paroxysmal Atrial Fibrillation - He denies any recent palpitations. Continue Verapamil (was transitioned from Cardizem to Verapamil per Neurology).  - He denies any evidence of active bleeding. Continue Eliquis for anticoagulation.   3. HTN - BP is well-controlled at 136/70 during today's visit. Continue current medication regimen with Benazepril 20mg  daily, Chlorthalidone 25mg  daily and Verapamil 80mg  TID (on this per Neurology).   4.  HLD - He was transitioned from Simvastatin to Atorvastatin 80mg  daily during his recent admission. Would anticipate obtaining repeat FLP and LFT's at the time of his next visit if not obtained by his PCP in the interim. Goal LDL is less than 70 with known CAD (at 1 during his recent admission).   5. Stage 2 CKD - Baseline creatinine 1.2 - 1.3. Will recheck BMET again today to reassess renal function following his recent procedures.    Medication  Adjustments/Labs and Tests Ordered: Current medicines are reviewed at length with the patient today.  Concerns regarding medicines are outlined above.  Medication changes, Labs and Tests ordered today are listed in the Patient Instructions below. Patient Instructions  Medication Instructions:  Your physician recommends that you continue on your current medications as directed. Please refer to the Current Medication list given to you today.  *If you need a refill on your cardiac medications before your next appointment, please call your pharmacy*   Lab Work: Your physician recommends that you return for lab work in: Today   If you have labs (blood work) drawn today and your tests are completely normal, you will receive your results only by: Marland Kitchen MyChart Message (if you have MyChart) OR . A paper copy in the mail If you have any lab test that is abnormal or we need to change your treatment, we will call you to review the results.   Testing/Procedures: NONE    Follow-Up: At [facility], you and your health needs are our priority.  As part of our continuing mission to provide you with exceptional heart care, we have created designated Provider Care Teams.  These Care Teams include your primary Cardiologist (physician) and Advanced Practice Providers (APPs -  Physician Assistants and Nurse Practitioners) who all work together to provide you with the care you need, when you need it.  We recommend signing up for the patient portal called "MyChart".  Sign up information is provided on this After Visit Summary.  MyChart is used to connect with patients for Virtual Visits (Telemedicine).  Patients are able to view lab/test results, encounter notes, upcoming appointments, etc.  Non-urgent messages can be sent to your provider as well.   To learn more about what you can do with MyChart, go to [email or web address].    Your next appointment:   3 month(s)  The format for your next appointment:     In Person  Provider:   Rozann Lesches, MD   Other Instructions Thank you for choosing [facility]!       Signed, Erma Heritage, PA-C  03/12/2020 9:08 AM    [facility] [address] [place], [place] [address] Phone: [phone] Fax: [phone]

## 2020-03-12 ENCOUNTER — Encounter: Payer: Self-pay | Admitting: Student

## 2020-04-08 ENCOUNTER — Encounter: Payer: Self-pay | Admitting: Cardiology

## 2020-04-08 ENCOUNTER — Ambulatory Visit (INDEPENDENT_AMBULATORY_CARE_PROVIDER_SITE_OTHER): Payer: Medicare Other | Admitting: Cardiology

## 2020-04-08 ENCOUNTER — Other Ambulatory Visit: Payer: Self-pay

## 2020-04-08 VITALS — BP 122/54 | HR 64 | Ht 71.0 in | Wt 193.0 lb

## 2020-04-08 DIAGNOSIS — I25119 Atherosclerotic heart disease of native coronary artery with unspecified angina pectoris: Secondary | ICD-10-CM

## 2020-04-08 DIAGNOSIS — I480 Paroxysmal atrial fibrillation: Secondary | ICD-10-CM | POA: Diagnosis not present

## 2020-04-08 DIAGNOSIS — R001 Bradycardia, unspecified: Secondary | ICD-10-CM | POA: Diagnosis not present

## 2020-04-08 DIAGNOSIS — E782 Mixed hyperlipidemia: Secondary | ICD-10-CM

## 2020-04-08 MED ORDER — VERAPAMIL HCL ER 120 MG PO TBCR
120.0000 mg | EXTENDED_RELEASE_TABLET | Freq: Every day | ORAL | 1 refills | Status: DC
Start: 2020-04-08 — End: 2020-05-31

## 2020-04-08 NOTE — Patient Instructions (Addendum)
Medication Instructions:   Your physician has recommended you make the following change in your medication:   Stop calan 80 mg  Start calan sr 120 mg by mouth daily  Continue other medications the same  Labwork:  NONE  Testing/Procedures:  NONE  Follow-Up:  Your physician recommends that you schedule a follow-up appointment in: late September 2021.  Any Other Special Instructions Will Be Listed Below (If Applicable).  If you need a refill on your cardiac medications before your next appointment, please call your pharmacy.

## 2020-04-08 NOTE — Progress Notes (Signed)
.    Cardiology Office Note  Date: 04/08/2020   ID: Kenneth Watkins., DOB 12/20/1945, MRN [id number]  PCP:  Lemmie Evens, MD  Cardiologist:  Rozann Lesches, MD Electrophysiologist:  None   Chief Complaint  Patient presents with  . Recent bradycardia and fatigue    History of Present Illness: Kenneth Watkins. is a 74 y.o. male seen recently in June by Ms. Strader PA-C.  I reviewed his cardiac history with PCI in June.  He called to schedule follow-up visit today.  He states that he has had intermittent fatigue and breathlessness, although in general has done better since his coronary intervention.  He has had 2 episodes of bradycardia as well, heart rate in the 40s associated with fatigue and sluggishness, most recently this occurred yesterday.  Interestingly, in reviewing his medications he tells me that he has been taking all 3 of his verapamil 80 mg tablets at once in the morning rather than spacing these out during the day.  He has been doing this since hospital discharge since he was told to take all of his medications in the morning.  I personally reviewed his ECG today which shows sinus rhythm with prolonged PR interval, heart rate 60.  He has come off aspirin and is now on combination of Eliquis and Plavix as discussed in the PCI note below.  He has been on high-dose Lipitor since intervention in June.  Last LDL was 74.  Past Medical History:  Diagnosis Date  . BPH (benign prostatic hyperplasia)   . CAD (coronary artery disease)    a. s/p DES to OM1 and DES to RCA in 02/2020  . CKD (chronic kidney disease) stage 3, GFR 30-59 ml/min   . Hyperlipidemia   . Hypertension   . Persistent atrial fibrillation ([place])   . Pinched nerve in neck   . Type 2 diabetes mellitus ([place])     Past Surgical History:  Procedure Laterality Date  . CORONARY STENT INTERVENTION N/A 02/24/2020   Procedure: CORONARY STENT INTERVENTION;  Surgeon: Troy Sine, MD;  Location: [place] [facility];   Service: Cardiovascular;  Laterality: N/A;  . EYE SURGERY    . LEFT HEART CATH AND CORONARY ANGIOGRAPHY N/A 02/23/2020   Procedure: LEFT HEART CATH AND CORONARY ANGIOGRAPHY;  Surgeon: Troy Sine, MD;  Location: [place] [facility];  Service: Cardiovascular;  Laterality: N/A;  . TONSILLECTOMY    . VASECTOMY      Current Outpatient Medications  Medication Sig Dispense Refill  . Accu-Chek Softclix Lancets lancets 1 each by Other route as needed.    Marland Kitchen apixaban (ELIQUIS) 5 MG TABS tablet Take 5 mg by mouth 2 (two) times daily.    Marland Kitchen atorvastatin (LIPITOR) 80 MG tablet Take 1 tablet (80 mg total) by mouth daily. 90 tablet 3  . B-D 3CC LUER-LOK SYR 22GX1" 22G X 1" 3 ML MISC 1 each by Other route as needed.    . BD DISP NEEDLES 18G X 1-1/2" MISC 1 each by Other route as needed.    . benazepril (LOTENSIN) 20 MG tablet Take 20 mg by mouth daily.    . [place]-Glucosamine-Vit D (OSTEO BI-FLEX ONE PER DAY PO) Take 1 tablet by mouth daily.    . chlorproMAZINE (THORAZINE) 25 MG tablet Take 1 tablet (25 mg total) by mouth 3 (three) times daily as needed. For hiccups 12 tablet 0  . chlorthalidone (HYGROTON) 25 MG tablet Take 25 mg by mouth daily.    Marland Kitchen  clopidogrel (PLAVIX) 75 MG tablet Take 1 tablet (75 mg total) by mouth daily with breakfast. 90 tablet 3  . clotrimazole-betamethasone (LOTRISONE) cream Apply 1 application topically daily as needed (Rash).     . Coenzyme Q10 (CO Q 10 PO) Take 500 mg by mouth daily.    . CONTOUR NEXT TEST test strip 1 each by Other route daily.    . methocarbamol (ROBAXIN) 500 MG tablet Take 500 mg by mouth daily as needed for muscle spasms.     . Misc Natural Products (MENS PROSTATE HEALTH FORMULA PO) Take 1 tablet by mouth daily. Real Health    . Multiple Vitamins-Minerals (MULTIVITAMIN WITH MINERALS) tablet Take 1 tablet by mouth daily. Men's    . nitroGLYCERIN (NITROSTAT) 0.4 MG SL tablet Place 1 tablet (0.4 mg total) under the tongue every 5 (five) minutes as needed  for chest pain. 25 tablet 1  . testosterone cypionate (DEPO-TESTOSTERONE) 200 MG/ML injection Inject 200 mg into the muscle every 14 (fourteen) days.     . tadalafil (CIALIS) 10 MG tablet Take 1 tablet by mouth as needed for erectile dysfunction.    . verapamil (CALAN SR) 120 MG CR tablet Take 1 tablet (120 mg total) by mouth at bedtime. 90 tablet 1   No current facility-administered medications for this visit.   Allergies:  Codeine   ROS:   Recent episode of atrial fibrillation by apple watch.  No regular sense of palpitations.  Physical Exam: VS:  BP (!) 122/54   Pulse 64   Ht 5\' 11"  (1.803 m)   Wt 193 lb (87.5 kg)   SpO2 99%   BMI 26.92 kg/m , BMI Body mass index is 26.92 kg/m.  Wt Readings from Last 3 Encounters:  04/08/20 193 lb (87.5 kg)  03/11/20 193 lb 6.4 oz (87.7 kg)  02/25/20 188 lb 12.8 oz (85.6 kg)    General: Elderly male, appears comfortable at rest. HEENT: Conjunctiva and lids normal, wearing a mask. Neck: Supple, no elevated JVP or carotid bruits, no thyromegaly. Lungs: Clear to auscultation, nonlabored breathing at rest. Cardiac: Regular rate and rhythm, no S3 or significant systolic murmur. Extremities: No pitting edema, distal pulses 2+.  ECG:  An ECG dated 02/24/2020 was personally reviewed today and demonstrated:  Sinus rhythm.  Recent Labwork: 02/25/2020: Hemoglobin 14.8; Platelets 181 03/11/2020: B Natriuretic Peptide 33.0; BUN 18; Creatinine, Ser 1.28; Potassium 3.8; Sodium 132     Component Value Date/Time   CHOL 133 02/24/2020 0411   TRIG 147 02/24/2020 0411   HDL 30 (L) 02/24/2020 0411   CHOLHDL 4.4 02/24/2020 0411   VLDL 29 02/24/2020 0411   LDLCALC 74 02/24/2020 0411    Other Studies Reviewed Today:  Cardiac catheterization 02/23/2020:  Prox RCA lesion is 90% stenosed.  Lat 3rd Mrg lesion is 70% stenosed.  1st Mrg lesion is 85% stenosed.  Mid LAD lesion is 30% stenosed.   Multivessel coronary productive disease with 30% LAD stenosis  between the first and second diagonal vessel; large codominant left circumflex coronary artery with 85% stenosis in a large proximal OM1 vessel, 20% AV groove circumflex stenosis with 70% distal stenosis in the OM 2 vessel; codominant RCA with 90% focal stenosis in the region of the RV marginal branch takeoff.  Normal LV function with EF estimate at 55%.  LVEDP 13 mmHg.  RECOMMENDATION: The patient will be brought back to the laboratory tomorrow for two-vessel PCI via the femoral approach due to significant angle takeoff with difficulty and catheter  kinking via the radial approach.  The patient was loaded with Plavix today.  He will start on 75 mg of Plavix in the morning.  We will hydrate.  Will change simvastatin to atorvastatin 80 mg for more potency statin therapy. Plan two-vessel PCI tomorrow.  PCI 02/24/2020:  Mid LAD lesion is 30% stenosed.  Lat 3rd Mrg lesion is 70% stenosed.  1st Mrg lesion is 85% stenosed.  Prox RCA lesion is 90% stenosed.  Post intervention, there is a 0% residual stenosis.  Post intervention, there is a 0% residual stenosis.  A stent was successfully placed.  A stent was successfully placed.   Successful two-vessel coronary intervention with insertion of a 2.5 x 15 mm Resolute Onyx DES stent postdilated to 2.75 mm in the OM1 vessel of the left circumflex coronary artery with the 85% stenosis being reduced to 0%; and the 90% mid RCA stenosis treated with a 2.0 x 15 mm Resolute stent postdilated to 2.25 mm with the 90% stenosis being reduced to 0%.  RECOMMENDATION: The patient will be hydrated overnight.  Reinstitute Eliquis 5 mg twice a day tomorrow with his history of PAF.  Triple drug therapy with aspirin 81 mg/Plavix 75 mg daily and Eliquis for 1 month; then DC aspirin with Plavix/Eliquis for 6 months probably then can DC Plavix and possibly resume aspirin 81 mg if no contraindication.  Medical therapy for mild concomitant CAD.  Aggressive lipid-lowering  therapy with LDL cholesterol 60 or below. Consider possible sleep evaluation with the patient's PAF history.  Assessment and Plan:  1.  Recurrent bradycardia and fatigue, likely medication induced as he was taking all three Calan 80 mg tablets at once in the mornings since hospital discharge.  ECG today shows sinus rhythm with prolonged PR interval, likely has component of conduction system slowing over time and has known paroxysmal atrial fibrillation as well.  Plan at this point is to stop short acting Calan and switch to Calan SR 120 mg daily.  Hopefully he will tolerate this better.  2.  Paroxysmal atrial fibrillation.  Switching to Calan SR 120 mg daily as noted above, continue Eliquis for stroke prophylaxis.  3.  CAD status post DES to the OM1 and DES to the mid RCA as discussed above.  He completed 1 month course of aspirin, now on Plavix along with his Eliquis.  Continue high-dose statin therapy.  No active angina at this time.  4.  Mixed hyperlipidemia, now on high-dose Lipitor.  Last LDL was 74.  Will consider follow-up lipid panel around time of next encounter.  Medication Adjustments/Labs and Tests Ordered: Current medicines are reviewed at length with the patient today.  Concerns regarding medicines are outlined above.   Tests Ordered: No orders of the defined types were placed in this encounter.   Medication Changes: Meds ordered this encounter  Medications  . verapamil (CALAN SR) 120 MG CR tablet    Sig: Take 1 tablet (120 mg total) by mouth at bedtime.    Dispense:  90 tablet    Refill:  1    04/08/2020 stop calan 80 mg    Disposition:  Follow up late September as scheduled.  Signed, Satira Sark, MD, [facility] 04/08/2020 12:22 PM    [facility] [address], [place], [place] [address] Phone: [phone]; Fax: [phone]

## 2020-04-12 NOTE — Addendum Note (Signed)
Addended by: Merlene Laughter on: 04/12/2020 03:23 PM   Modules accepted: Orders

## 2020-05-31 ENCOUNTER — Other Ambulatory Visit: Payer: Self-pay | Admitting: Cardiology

## 2020-06-16 ENCOUNTER — Ambulatory Visit: Payer: Medicare Other | Admitting: Student

## 2020-06-20 NOTE — Progress Notes (Signed)
Cardiology Office Note  Date: 06/21/2020   ID: Kenneth Watkins., DOB 01-10-1946, MRN [id number]  PCP:  Lemmie Evens, MD  Cardiologist:  Rozann Lesches, MD Electrophysiologist:  None   Chief Complaint  Patient presents with  . Cardiac follow-up    History of Present Illness: Kenneth Watkins. is a 74 y.o. male last seen in July.  He presents for a routine visit.  States that he has been doing well, no angina symptoms or fatigue, NYHA class II dyspnea.  He also mentions that he has not noticed any palpitations since PCI.  He was switched from short acting Calan to [place] at the last visit.  No longer bradycardic and feeling better.  I reviewed the remainder of his cardiac medications which are outlined below.  We plan to continue combination of Eliquis and Plavix 6 months from his PCI in June, then go to Eliquis alone.  He has been tolerating high-dose Lipitor, follow-up lipid panel for his next visit in December.  Past Medical History:  Diagnosis Date  . BPH (benign prostatic hyperplasia)   . CAD (coronary artery disease)    a. s/p DES to OM1 and DES to RCA in 02/2020  . CKD (chronic kidney disease) stage 3, GFR 30-59 ml/min (H[facility])   . Hyperlipidemia   . Hypertension   . Persistent atrial fibrillation ([place])   . Pinched nerve in neck   . Type 2 diabetes mellitus ([place])     Past Surgical History:  Procedure Laterality Date  . CORONARY STENT INTERVENTION N/A 02/24/2020   Procedure: CORONARY STENT INTERVENTION;  Surgeon: Troy Sine, MD;  Location: [place] [facility];  Service: Cardiovascular;  Laterality: N/A;  . EYE SURGERY    . LEFT HEART CATH AND CORONARY ANGIOGRAPHY N/A 02/23/2020   Procedure: LEFT HEART CATH AND CORONARY ANGIOGRAPHY;  Surgeon: Troy Sine, MD;  Location: [place] [facility];  Service: Cardiovascular;  Laterality: N/A;  . TONSILLECTOMY    . VASECTOMY      Current Outpatient Medications  Medication Sig Dispense Refill  . Accu-Chek Softclix  Lancets lancets 1 each by Other route as needed.    Marland Kitchen apixaban (ELIQUIS) 5 MG TABS tablet Take 5 mg by mouth 2 (two) times daily.    Marland Kitchen atorvastatin (LIPITOR) 80 MG tablet Take 1 tablet (80 mg total) by mouth daily. 90 tablet 3  . B-D 3CC LUER-LOK SYR 22GX1" 22G X 1" 3 ML MISC 1 each by Other route as needed.    . BD DISP NEEDLES 18G X 1-1/2" MISC 1 each by Other route as needed.    . benazepril (LOTENSIN) 20 MG tablet Take 20 mg by mouth daily.    . [place]-Glucosamine-Vit D (OSTEO BI-FLEX ONE PER DAY PO) Take 1 tablet by mouth daily.    . chlorproMAZINE (THORAZINE) 25 MG tablet Take 1 tablet (25 mg total) by mouth 3 (three) times daily as needed. For hiccups 12 tablet 0  . chlorthalidone (HYGROTON) 25 MG tablet Take 25 mg by mouth daily.    . clopidogrel (PLAVIX) 75 MG tablet Take 1 tablet (75 mg total) by mouth daily with breakfast. 90 tablet 3  . clotrimazole-betamethasone (LOTRISONE) cream Apply 1 application topically daily as needed (Rash).     . Coenzyme Q10 (CO Q 10 PO) Take 500 mg by mouth daily.    . CONTOUR NEXT TEST test strip 1 each by Other route daily.    . methocarbamol (ROBAXIN) 500 MG tablet Take  500 mg by mouth daily as needed for muscle spasms.     . Misc Natural Products (MENS PROSTATE HEALTH FORMULA PO) Take 1 tablet by mouth daily. Real Health    . Multiple Vitamins-Minerals (MULTIVITAMIN WITH MINERALS) tablet Take 1 tablet by mouth daily. Men's    . nitroGLYCERIN (NITROSTAT) 0.4 MG SL tablet Place 1 tablet (0.4 mg total) under the tongue every 5 (five) minutes as needed for chest pain. 25 tablet 1  . tadalafil (CIALIS) 10 MG tablet Take 1 tablet by mouth as needed for erectile dysfunction.    Marland Kitchen testosterone cypionate (DEPO-TESTOSTERONE) 200 MG/ML injection Inject 200 mg into the muscle every 14 (fourteen) days.     . verapamil (CALAN-SR) 120 MG CR tablet TAKE 1 TABLET (120 MG TOTAL) BY MOUTH AT BEDTIME. 90 tablet 1   No current facility-administered medications for this  visit.   Allergies:  Codeine   ROS:  No syncope.  Physical Exam: VS:  BP 130/68   Pulse 71   Ht 6\' 1"  (1.854 m)   Wt 193 lb (87.5 kg)   SpO2 98%   BMI 25.46 kg/m , BMI Body mass index is 25.46 kg/m.  Wt Readings from Last 3 Encounters:  06/21/20 193 lb (87.5 kg)  04/08/20 193 lb (87.5 kg)  03/11/20 193 lb 6.4 oz (87.7 kg)    General:Elderly male, appears comfortable at rest. HEENT: Conjunctiva and lids normal, wearing a mask. Neck: Supple, no elevated JVP or carotid bruits, no thyromegaly. Lungs: Clear to auscultation, nonlabored breathing at rest. Cardiac: Regular rate and rhythm, no S3 or significant systolic murmur. Extremities: No pitting edema, distal pulses 2+.  ECG:  An ECG dated 04/08/2020 was personally reviewed today and demonstrated:  Sinus rhythm with prolonged PR interval, heart rate 60.  Recent Labwork: 02/25/2020: Hemoglobin 14.8; Platelets 181 03/11/2020: B Natriuretic Peptide 33.0; BUN 18; Creatinine, Ser 1.28; Potassium 3.8; Sodium 132     Component Value Date/Time   CHOL 133 02/24/2020 0411   TRIG 147 02/24/2020 0411   HDL 30 (L) 02/24/2020 0411   CHOLHDL 4.4 02/24/2020 0411   VLDL 29 02/24/2020 0411   LDLCALC 74 02/24/2020 0411    Other Studies Reviewed Today:  Cardiac catheterization 02/23/2020:  Prox RCA lesion is 90% stenosed.  Lat 3rd Mrg lesion is 70% stenosed.  1st Mrg lesion is 85% stenosed.  Mid LAD lesion is 30% stenosed.  Multivessel coronary productive disease with 30% LAD stenosis between the first and second diagonal vessel; large codominant left circumflex coronary artery with 85% stenosis in a large proximal OM1 vessel, 20% AV groove circumflex stenosis with 70% distal stenosis in the OM 2 vessel; codominant RCA with 90% focal stenosis in the region of the RV marginal branch takeoff.  Normal LV function with EF estimate at 55%. LVEDP 13 mmHg.  RECOMMENDATION: The patient will be brought back to the laboratory tomorrow for  two-vessel PCI via the femoral approach due to significant angle takeoff with difficulty and catheter kinking via the radial approach. The patient was loaded with Plavix today. He will start on 75 mg of Plavix in the morning. We will hydrate. Will change simvastatin to atorvastatin 80 mg for more potency statin therapy. Plan two-vessel PCI tomorrow.  PCI 02/24/2020:  Mid LAD lesion is 30% stenosed.  Lat 3rd Mrg lesion is 70% stenosed.  1st Mrg lesion is 85% stenosed.  Prox RCA lesion is 90% stenosed.  Post intervention, there is a 0% residual stenosis.  Post intervention, there  is a 0% residual stenosis.  A stent was successfully placed.  A stent was successfully placed.  Successful two-vessel coronary intervention with insertion of a 2.5 x 15 mm Resolute Onyx DES stent postdilated to 2.75 mm in the OM1 vessel of the left circumflex coronary artery with the 85% stenosis being reduced to 0%; and the 90% mid RCA stenosis treated with a 2.0 x 15 mm Resolute stent postdilated to 2.25 mm with the 90% stenosis being reduced to 0%.  RECOMMENDATION: The patient will be hydrated overnight. Reinstitute Eliquis 5 mg twice a day tomorrow with his history of PAF. Triple drug therapy with aspirin 81 mg/Plavix 75 mg daily and Eliquis for 1 month; then DC aspirin with Plavix/Eliquis for 6 months probably then can DC Plavix and possibly resume aspirin 81 mg if no contraindication. Medical therapy for mild concomitant CAD. Aggressive lipid-lowering therapy with LDL cholesterol 60 or below. Consider possible sleep evaluation with the patient's PAF history.  Assessment and Plan:  1.  CAD status post DES to the OM1 and DES to the mid RCA in June.  He is doing well without active angina.  Continues on Plavix along with Eliquis, plan to discontinue Plavix most likely in December.  He otherwise remains on statin therapy.  2.  Mixed hyperlipidemia, continue Lipitor and check FLP for next visit.  3.   Paroxysmal atrial fibrillation with CHA2DS2-VASc score of 4.  Continue Eliquis and Calan SR.  Medication Adjustments/Labs and Tests Ordered: Current medicines are reviewed at length with the patient today.  Concerns regarding medicines are outlined above.   Tests Ordered: Orders Placed This Encounter  Procedures  . Lipid panel    Medication Changes: No orders of the defined types were placed in this encounter.   Disposition:  Follow up December in the [facility] office.  Signed, Satira Sark, MD, [facility] 06/21/2020 9:13 AM    [place] at [place], [place], [place] [address] Phone: [phone]; Fax: [phone]

## 2020-06-21 ENCOUNTER — Ambulatory Visit (INDEPENDENT_AMBULATORY_CARE_PROVIDER_SITE_OTHER): Payer: Medicare Other | Admitting: Cardiology

## 2020-06-21 ENCOUNTER — Encounter: Payer: Self-pay | Admitting: Cardiology

## 2020-06-21 VITALS — BP 130/68 | HR 71 | Ht 73.0 in | Wt 193.0 lb

## 2020-06-21 DIAGNOSIS — I25119 Atherosclerotic heart disease of native coronary artery with unspecified angina pectoris: Secondary | ICD-10-CM

## 2020-06-21 DIAGNOSIS — I4819 Other persistent atrial fibrillation: Secondary | ICD-10-CM

## 2020-06-21 DIAGNOSIS — E782 Mixed hyperlipidemia: Secondary | ICD-10-CM

## 2020-06-21 NOTE — Patient Instructions (Addendum)
Medication Instructions:   Your physician recommends that you continue on your current medications as directed. Please refer to the Current Medication list given to you today.  Labwork: Your physician recommends that you return for a FASTING lipid profile: in December 2021 just before your next visit. Please do not eat or drink for at least 8 hours when you have this done. You may take your medications that morning with a sip of water. This may be done at [facility] or [facility] Monday-Friday from 8:00 am - 4:00 pm. No appointment is needed.  Testing/Procedures:  None  Follow-Up:  Your physician recommends that you schedule a follow-up appointment in: 2 months.  Any Other Special Instructions Will Be Listed Below (If Applicable).  If you need a refill on your cardiac medications before your next appointment, please call your pharmacy.

## 2020-08-18 LAB — LIPID PANEL
Cholesterol: 148 mg/dL (ref <200)
HDL: 37 mg/dL — ABNORMAL LOW (ref >=40)
LDL Cholesterol (Calc): 81 mg/dL (calc)
Non-HDL Cholesterol (Calc): 111 mg/dL (calc) (ref <130)
Total CHOL/HDL Ratio: 4 (calc) (ref <5.0)
Triglycerides: 206 mg/dL — ABNORMAL HIGH (ref <150)

## 2020-08-21 NOTE — Progress Notes (Signed)
Cardiology Office Note  Date: 08/22/2020   ID: Kenneth Watkins., DOB [birth year], MRN [id number]  PCP:  Lemmie Evens, MD  Cardiologist:  Rozann Lesches, MD Electrophysiologist:  None   Chief Complaint  Patient presents with  . Cardiac follow-up    History of Present Illness: Kenneth Watkins. is a 74 y.o. male last seen in October.  He presents for a routine visit.  He states that he has been playing golf, also using a stationary bicycle a few days a week.  He would like to go back to his more regular walking plan as well.  He does not describe any worsening angina symptoms or progressive dyspnea on exertion.  No palpitations or syncope.  As per previous note, plan has been for discontinuation of Plavix at the end of this month with resumption of low-dose aspirin.  We discussed this today.  Blood pressure was elevated in office, his home checks have looked better with systolics in the 195K.  He reports compliance with his medications.  He did have an episode of atrial fibrillation on November 28 as documented by his Apple watch.  He does not describe any progressive sense of palpitations however.   Past Medical History:  Diagnosis Date  . BPH (benign prostatic hyperplasia)   . CAD (coronary artery disease)    a. s/p DES to OM1 and DES to RCA in 02/2020  . CKD (chronic kidney disease) stage 3, GFR 30-59 ml/min (HCC)   . Hyperlipidemia   . Hypertension   . Persistent atrial fibrillation ([place])   . Pinched nerve in neck   . Type 2 diabetes mellitus ([place])     Past Surgical History:  Procedure Laterality Date  . CORONARY STENT INTERVENTION N/A 02/24/2020   Procedure: CORONARY STENT INTERVENTION;  Surgeon: Troy Sine, MD;  Location: [facility];  Service: Cardiovascular;  Laterality: N/A;  . EYE SURGERY    . LEFT HEART CATH AND CORONARY ANGIOGRAPHY N/A 02/23/2020   Procedure: LEFT HEART CATH AND CORONARY ANGIOGRAPHY;  Surgeon: Troy Sine, MD;  Location: [facility];  Service: Cardiovascular;  Laterality: N/A;  . TONSILLECTOMY    . VASECTOMY      Current Outpatient Medications  Medication Sig Dispense Refill  . Accu-Chek Softclix Lancets lancets 1 each by Other route as needed.    Marland Kitchen apixaban (ELIQUIS) 5 MG TABS tablet Take 5 mg by mouth 2 (two) times daily.    Marland Kitchen atorvastatin (LIPITOR) 80 MG tablet Take 1 tablet (80 mg total) by mouth daily. 90 tablet 3  . B-D 3CC LUER-LOK SYR 22GX1" 22G X 1" 3 ML MISC 1 each by Other route as needed.    . BD DISP NEEDLES 18G X 1-1/2" MISC 1 each by Other route as needed.    . benazepril (LOTENSIN) 20 MG tablet Take 20 mg by mouth daily.    . [place]-Glucosamine-Vit D (OSTEO BI-FLEX ONE PER DAY PO) Take 1 tablet by mouth daily.    . chlorproMAZINE (THORAZINE) 25 MG tablet Take 1 tablet (25 mg total) by mouth 3 (three) times daily as needed. For hiccups 12 tablet 0  . chlorthalidone (HYGROTON) 25 MG tablet Take 25 mg by mouth daily.    . clopidogrel (PLAVIX) 75 MG tablet Take 1 tablet (75 mg total) by mouth daily with breakfast for 25 days. 90 tablet 0  . clotrimazole-betamethasone (LOTRISONE) cream Apply 1 application topically daily as needed (Rash).     . Coenzyme  Q10 (CO Q 10 PO) Take 500 mg by mouth daily.    . CONTOUR NEXT TEST test strip 1 each by Other route daily.    . methocarbamol (ROBAXIN) 500 MG tablet Take 500 mg by mouth daily as needed for muscle spasms.     . Misc Natural Products (MENS PROSTATE HEALTH FORMULA PO) Take 1 tablet by mouth daily. Real Health    . Multiple Vitamins-Minerals (MULTIVITAMIN WITH MINERALS) tablet Take 1 tablet by mouth daily. Men's    . nitroGLYCERIN (NITROSTAT) 0.4 MG SL tablet Place 1 tablet (0.4 mg total) under the tongue every 5 (five) minutes as needed for chest pain. 25 tablet 1  . tadalafil (CIALIS) 10 MG tablet Take 1 tablet by mouth as needed for erectile dysfunction.    Marland Kitchen testosterone cypionate (DEPO-TESTOSTERONE) 200 MG/ML injection Inject 200 mg into  the muscle every 14 (fourteen) days.     . verapamil (CALAN-SR) 120 MG CR tablet TAKE 1 TABLET (120 MG TOTAL) BY MOUTH AT BEDTIME. 90 tablet 1  . [START ON 09/17/2020] aspirin EC 81 MG tablet Take 1 tablet (81 mg total) by mouth daily. Swallow whole. Start when you stop plavix 90 tablet 3   No current facility-administered medications for this visit.   Allergies:  Codeine   ROS: No syncope.  Physical Exam: VS:  BP (!) 150/80   Pulse 62   Ht 6\' 1"  (1.854 m)   Wt 193 lb 6.4 oz (87.7 kg)   SpO2 99%   BMI 25.52 kg/m , BMI Body mass index is 25.52 kg/m.  Wt Readings from Last 3 Encounters:  08/22/20 193 lb 6.4 oz (87.7 kg)  06/21/20 193 lb (87.5 kg)  04/08/20 193 lb (87.5 kg)    General: Elderly male, appears comfortable at rest. HEENT: Conjunctiva and lids normal, wearing a mask. Neck: Supple, no elevated JVP or carotid bruits, no thyromegaly. Lungs: Clear to auscultation, nonlabored breathing at rest. Cardiac: Regular rate and rhythm, no S3 or significant systolic murmur, no pericardial rub. Extremities: No pitting edema.  ECG:  An ECG dated 04/08/2020 was personally reviewed today and demonstrated:  Sinus rhythm with prolonged PR interval, heart rate 60.  Recent Labwork: 02/25/2020: Hemoglobin 14.8; Platelets 181 03/11/2020: B Natriuretic Peptide 33.0; BUN 18; Creatinine, Ser 1.28; Potassium 3.8; Sodium 132     Component Value Date/Time   CHOL 148 08/17/2020 1030   TRIG 206 (H) 08/17/2020 1030   HDL 37 (L) 08/17/2020 1030   CHOLHDL 4.0 08/17/2020 1030   VLDL 29 02/24/2020 0411   LDLCALC 81 08/17/2020 1030    Other Studies Reviewed Today:  Cardiac catheterization 02/23/2020:  Prox RCA lesion is 90% stenosed.  Lat 3rd Mrg lesion is 70% stenosed.  1st Mrg lesion is 85% stenosed.  Mid LAD lesion is 30% stenosed.  Multivessel coronary productive disease with 30% LAD stenosis between the first and second diagonal vessel; large codominant left circumflex coronary artery  with 85% stenosis in a large proximal OM1 vessel, 20% AV groove circumflex stenosis with 70% distal stenosis in the OM 2 vessel; codominant RCA with 90% focal stenosis in the region of the RV marginal branch takeoff.  Normal LV function with EF estimate at 55%. LVEDP 13 mmHg.  RECOMMENDATION: The patient will be brought back to the laboratory tomorrow for two-vessel PCI via the femoral approach due to significant angle takeoff with difficulty and catheter kinking via the radial approach. The patient was loaded with Plavix today. He will start on 75 mg  of Plavix in the morning. We will hydrate. Will change simvastatin to atorvastatin 80 mg for more potency statin therapy. Plan two-vessel PCI tomorrow.  PCI 02/24/2020:  Mid LAD lesion is 30% stenosed.  Lat 3rd Mrg lesion is 70% stenosed.  1st Mrg lesion is 85% stenosed.  Prox RCA lesion is 90% stenosed.  Post intervention, there is a 0% residual stenosis.  Post intervention, there is a 0% residual stenosis.  A stent was successfully placed.  A stent was successfully placed.  Successful two-vessel coronary intervention with insertion of a 2.5 x 15 mm Resolute Onyx DES stent postdilated to 2.75 mm in the OM1 vessel of the left circumflex coronary artery with the 85% stenosis being reduced to 0%; and the 90% mid RCA stenosis treated with a 2.0 x 15 mm Resolute stent postdilated to 2.25 mm with the 90% stenosis being reduced to 0%.  RECOMMENDATION: The patient will be hydrated overnight. Reinstitute Eliquis 5 mg twice a day tomorrow with his history of PAF. Triple drug therapy with aspirin 81 mg/Plavix 75 mg daily and Eliquis for 1 month; then DC aspirin with Plavix/Eliquis for 6 months probably then can DC Plavix and possibly resume aspirin 81 mg if no contraindication. Medical therapy for mild concomitant CAD. Aggressive lipid-lowering therapy with LDL cholesterol 60 or below. Consider possible sleep evaluation with the patient's  PAF history.  Assessment and Plan:  1.  CAD status post DES to the OM1 and DES to the mid RCA in June of this year.  He reports no progressive angina or increasing dyspnea on exertion.  Discussed regular exercise plan and continuation of medical therapy.  He will finish Plavix at the end of this month and then resume aspirin 81 mg daily.  Continue Lipitor, Lotensin, and verapamil.  2.  Mixed hyperlipidemia, on high-dose Lipitor.  Last LDL was 81.  3.  Essential hypertension, blood pressure elevated today but home blood pressure checks have looked better with systolics in the 295J.  Continue observation.  No change in dose of Lotensin and chlorthalidone for now.  4.  Paroxysmal atrial fibrillation, CHA2DS2-VASc score is 4.  He does have intermittent episodes, documented by Apple watch but no active palpitations.  Continue Eliquis for stroke prophylaxis and verapamil.  Medication Adjustments/Labs and Tests Ordered: Current medicines are reviewed at length with the patient today.  Concerns regarding medicines are outlined above.   Tests Ordered: No orders of the defined types were placed in this encounter.   Medication Changes: Meds ordered this encounter  Medications  . clopidogrel (PLAVIX) 75 MG tablet    Sig: Take 1 tablet (75 mg total) by mouth daily with breakfast for 25 days.    Dispense:  90 tablet    Refill:  0    08/22/2020 stop 09/16/20  . aspirin EC 81 MG tablet    Sig: Take 1 tablet (81 mg total) by mouth daily. Swallow whole. Start when you stop plavix    Dispense:  90 tablet    Refill:  3    08/22/2020 start 09/17/2020    Disposition:  Follow up 6 months in the [place] office.  Signed, Satira Sark, MD, [facility] 08/22/2020 8:58 AM    [facility] [place], [place], [place] [address] Phone: [phone]; Fax: [phone]

## 2020-08-22 ENCOUNTER — Ambulatory Visit (INDEPENDENT_AMBULATORY_CARE_PROVIDER_SITE_OTHER): Payer: Medicare Other | Admitting: Cardiology

## 2020-08-22 ENCOUNTER — Encounter: Payer: Self-pay | Admitting: Cardiology

## 2020-08-22 VITALS — BP 150/80 | HR 62 | Ht 73.0 in | Wt 193.4 lb

## 2020-08-22 DIAGNOSIS — I25119 Atherosclerotic heart disease of native coronary artery with unspecified angina pectoris: Secondary | ICD-10-CM | POA: Diagnosis not present

## 2020-08-22 DIAGNOSIS — I480 Paroxysmal atrial fibrillation: Secondary | ICD-10-CM

## 2020-08-22 DIAGNOSIS — I10 Essential (primary) hypertension: Secondary | ICD-10-CM

## 2020-08-22 DIAGNOSIS — E782 Mixed hyperlipidemia: Secondary | ICD-10-CM

## 2020-08-22 MED ORDER — ASPIRIN EC 81 MG PO TBEC
81.0000 mg | DELAYED_RELEASE_TABLET | Freq: Every day | ORAL | 3 refills | Status: DC
Start: 2020-09-17 — End: 2022-01-22

## 2020-08-22 MED ORDER — CLOPIDOGREL BISULFATE 75 MG PO TABS: 75.0000 mg | ORAL_TABLET | Freq: Every day | ORAL | 0 refills | Status: AC

## 2020-08-22 NOTE — Patient Instructions (Addendum)
Medication Instructions:   Your physician has recommended you make the following change in your medication:   Stop clopidogrel 09/16/2020  Start aspirin 81 mg by mouth daily on 09/17/2020  Continue other medications the same  Labwork:  None  Testing/Procedures:  None  Follow-Up:  Your physician recommends that you schedule a follow-up appointment in: 6 months.   Any Other Special Instructions Will Be Listed Below (If Applicable).  If you need a refill on your cardiac medications before your next appointment, please call your pharmacy.

## 2020-11-08 ENCOUNTER — Encounter (INDEPENDENT_AMBULATORY_CARE_PROVIDER_SITE_OTHER): Payer: Self-pay | Admitting: *Deleted

## 2020-12-06 ENCOUNTER — Other Ambulatory Visit: Payer: Self-pay | Admitting: Cardiology

## 2021-01-24 ENCOUNTER — Other Ambulatory Visit: Payer: Self-pay | Admitting: Medical

## 2021-01-31 ENCOUNTER — Other Ambulatory Visit (INDEPENDENT_AMBULATORY_CARE_PROVIDER_SITE_OTHER): Payer: Self-pay

## 2021-01-31 DIAGNOSIS — Z1211 Encounter for screening for malignant neoplasm of colon: Secondary | ICD-10-CM

## 2021-02-21 NOTE — Progress Notes (Signed)
Cardiology Office Note  Date: 02/22/2021   ID: [name]., DOB 25-Dec-1945, MRN [id number]  PCP:  Lemmie Evens, MD  Cardiologist:  Rozann Lesches, MD Electrophysiologist:  None   Chief Complaint  Patient presents with  . Cardiac follow-up    History of Present Illness: Kenneth Watkins. is a 75 y.o. male last seen in December 2021.  He presents for a follow-up visit.  Continues to do well, no active angina symptoms or unusual shortness of breath with typical activities.  He has been playing golf about 4 days a week.  I reviewed his medications, he completed Plavix around the time of our last visit.  No bleeding problems on aspirin and Eliquis.  Anticipates a follow-up lab work and physical with Dr. Karie Kirks in July.  No sense of palpitations or obvious breakthrough atrial fibrillation.  I personally reviewed his ECG today which shows sinus rhythm with prolonged PR interval.  Past Medical History:  Diagnosis Date  . BPH (benign prostatic hyperplasia)   . CAD (coronary artery disease)    a. s/p DES to OM1 and DES to RCA in 02/2020  . CKD (chronic kidney disease) stage 3, GFR 30-59 ml/min (HCC)   . Hyperlipidemia   . Hypertension   . Persistent atrial fibrillation ([place])   . Pinched nerve in neck   . Type 2 diabetes mellitus ([place])     Past Surgical History:  Procedure Laterality Date  . CORONARY STENT INTERVENTION N/A 02/24/2020   Procedure: CORONARY STENT INTERVENTION;  Surgeon: Troy Sine, MD;  Location: [facility];  Service: Cardiovascular;  Laterality: N/A;  . EYE SURGERY    . LEFT HEART CATH AND CORONARY ANGIOGRAPHY N/A 02/23/2020   Procedure: LEFT HEART CATH AND CORONARY ANGIOGRAPHY;  Surgeon: Troy Sine, MD;  Location: [facility];  Service: Cardiovascular;  Laterality: N/A;  . TONSILLECTOMY    . VASECTOMY      Current Outpatient Medications  Medication Sig Dispense Refill  . Accu-Chek Softclix Lancets lancets 1 each by Other route as  needed.    Marland Kitchen apixaban (ELIQUIS) 5 MG TABS tablet Take 5 mg by mouth 2 (two) times daily.    Marland Kitchen aspirin EC 81 MG tablet Take 1 tablet (81 mg total) by mouth daily. Swallow whole. Start when you stop plavix 90 tablet 3  . atorvastatin (LIPITOR) 80 MG tablet Take 1 tablet (80 mg total) by mouth daily. 90 tablet 3  . B-D 3CC LUER-LOK SYR 22GX1" 22G X 1" 3 ML MISC 1 each by Other route as needed.    . BD DISP NEEDLES 18G X 1-1/2" MISC 1 each by Other route as needed.    . benazepril (LOTENSIN) 20 MG tablet Take 20 mg by mouth daily.    . Boswellia-Glucosamine-Vit D (OSTEO BI-FLEX ONE PER DAY PO) Take 1 tablet by mouth daily.    . chlorproMAZINE (THORAZINE) 25 MG tablet Take 1 tablet (25 mg total) by mouth 3 (three) times daily as needed. For hiccups 12 tablet 0  . chlorthalidone (HYGROTON) 25 MG tablet Take 25 mg by mouth daily.    . clotrimazole-betamethasone (LOTRISONE) cream Apply 1 application topically daily as needed (Rash).     . Coenzyme Q10 (CO Q 10 PO) Take 500 mg by mouth daily.    . CONTOUR NEXT TEST test strip 1 each by Other route daily.    . methocarbamol (ROBAXIN) 500 MG tablet Take 500 mg by mouth daily as needed for  muscle spasms.     . Misc Natural Products (MENS PROSTATE HEALTH FORMULA PO) Take 1 tablet by mouth daily. Real Health    . Multiple Vitamins-Minerals (MULTIVITAMIN WITH MINERALS) tablet Take 1 tablet by mouth daily. Men's    . nitroGLYCERIN (NITROSTAT) 0.4 MG SL tablet Place 1 tablet (0.4 mg total) under the tongue every 5 (five) minutes as needed for chest pain. 25 tablet 1  . tadalafil (CIALIS) 10 MG tablet Take 1 tablet by mouth as needed for erectile dysfunction.    Marland Kitchen testosterone cypionate (DEPOTESTOSTERONE CYPIONATE) 200 MG/ML injection Inject 200 mg into the muscle every 14 (fourteen) days.     . verapamil (CALAN-SR) 120 MG CR tablet TAKE 1 TABLET (120 MG TOTAL) BY MOUTH AT BEDTIME. 90 tablet 3   No current facility-administered medications for this visit.    Allergies:  Codeine   ROS: No dizziness or syncope.  Physical Exam: VS:  BP 128/66   Pulse 65   Ht 6\' 1"  (1.854 m)   Wt 191 lb 12.8 oz (87 kg)   SpO2 98%   BMI 25.30 kg/m , BMI Body mass index is 25.3 kg/m.  Wt Readings from Last 3 Encounters:  02/22/21 191 lb 12.8 oz (87 kg)  08/22/20 193 lb 6.4 oz (87.7 kg)  06/21/20 193 lb (87.5 kg)    General: Patient appears comfortable at rest. HEENT: Conjunctiva and lids normal, wearing a mask. Neck: Supple, no elevated JVP or carotid bruits, no thyromegaly. Lungs: Clear to auscultation, nonlabored breathing at rest. Cardiac: Regular rate and rhythm, no S3 or significant systolic murmur, no pericardial rub. Extremities: No pitting edema.  ECG:  An ECG dated 04/08/2020 was personally reviewed today and demonstrated:  Sinus rhythm with prolonged PR interval.  Recent Labwork: 02/25/2020: Hemoglobin 14.8; Platelets 181 03/11/2020: B Natriuretic Peptide 33.0; BUN 18; Creatinine, Ser 1.28; Potassium 3.8; Sodium 132     Component Value Date/Time   CHOL 148 08/17/2020 1030   TRIG 206 (H) 08/17/2020 1030   HDL 37 (L) 08/17/2020 1030   CHOLHDL 4.0 08/17/2020 1030   VLDL 29 02/24/2020 0411   LDLCALC 81 08/17/2020 1030    Other Studies Reviewed Today:  Cardiac catheterization 02/23/2020:  Prox RCA lesion is 90% stenosed.  Lat 3rd Mrg lesion is 70% stenosed.  1st Mrg lesion is 85% stenosed.  Mid LAD lesion is 30% stenosed.  Multivessel coronary productive disease with 30% LAD stenosis between the first and second diagonal vessel; large codominant left circumflex coronary artery with 85% stenosis in a large proximal OM1 vessel, 20% AV groove circumflex stenosis with 70% distal stenosis in the OM 2 vessel; codominant RCA with 90% focal stenosis in the region of the RV marginal branch takeoff.  Normal LV function with EF estimate at 55%. LVEDP 13 mmHg.  RECOMMENDATION: The patient will be brought back to the laboratory tomorrow for  two-vessel PCI via the femoral approach due to significant angle takeoff with difficulty and catheter kinking via the radial approach. The patient was loaded with Plavix today. He will start on 75 mg of Plavix in the morning. We will hydrate. Will change simvastatin to atorvastatin 80 mg for more potency statin therapy. Plan two-vessel PCI tomorrow.  PCI 02/24/2020:  Mid LAD lesion is 30% stenosed.  Lat 3rd Mrg lesion is 70% stenosed.  1st Mrg lesion is 85% stenosed.  Prox RCA lesion is 90% stenosed.  Post intervention, there is a 0% residual stenosis.  Post intervention, there is a 0% residual  stenosis.  A stent was successfully placed.  A stent was successfully placed.  Successful two-vessel coronary intervention with insertion of a 2.5 x 15 mm Resolute Onyx DES stent postdilated to 2.75 mm in the OM1 vessel of the left circumflex coronary artery with the 85% stenosis being reduced to 0%; and the 90% mid RCA stenosis treated with a 2.0 x 15 mm Resolute stent postdilated to 2.25 mm with the 90% stenosis being reduced to 0%.  RECOMMENDATION: The patient will be hydrated overnight. Reinstitute Eliquis 5 mg twice a day tomorrow with his history of PAF. Triple drug therapy with aspirin 81 mg/Plavix 75 mg daily and Eliquis for 1 month; then DC aspirin with Plavix/Eliquis for 6 months probably then can DC Plavix and possibly resume aspirin 81 mg if no contraindication. Medical therapy for mild concomitant CAD. Aggressive lipid-lowering therapy with LDL cholesterol 60 or below. Consider possible sleep evaluation with the patient's PAF history.  Assessment and Plan:  1.  CAD status post DES to the OM1 and DES to the mid RCA in June 2021.  He completed 16-month course of Plavix, now on aspirin along with Eliquis as noted below.  He does not report any active angina and is doing well on medical therapy otherwise.  ECG reviewed.  Continue Lipitor, Lotensin, and Calan SR.  2.  Paroxysmal  atrial fibrillation with CHA2DS2-VASc score of 4.  No obvious breakthrough events since November of last year.  Continue Eliquis for stroke prophylaxis.  3.  Acquired thrombophilia, on Eliquis for stroke prophylaxis.  No spontaneous bleeding problems.  Due for lab work with Dr. Karie Kirks this summer.  Medication Adjustments/Labs and Tests Ordered: Current medicines are reviewed at length with the patient today.  Concerns regarding medicines are outlined above.   Tests Ordered: Orders Placed This Encounter  Procedures  . EKG 12-Lead    Medication Changes: No orders of the defined types were placed in this encounter.   Disposition:  Follow up 6 months.  Signed, Satira Sark, MD, [facility] 02/22/2021 11:43 AM    [facility] [place], [place], [place] [address] Phone: [phone]; Fax: [phone]

## 2021-02-22 ENCOUNTER — Encounter: Payer: Self-pay | Admitting: Cardiology

## 2021-02-22 ENCOUNTER — Ambulatory Visit (INDEPENDENT_AMBULATORY_CARE_PROVIDER_SITE_OTHER): Payer: Medicare Other | Admitting: Cardiology

## 2021-02-22 VITALS — BP 128/66 | HR 65 | Ht 73.0 in | Wt 191.8 lb

## 2021-02-22 DIAGNOSIS — D6869 Other thrombophilia: Secondary | ICD-10-CM | POA: Diagnosis not present

## 2021-02-22 DIAGNOSIS — I480 Paroxysmal atrial fibrillation: Secondary | ICD-10-CM

## 2021-02-22 DIAGNOSIS — I25119 Atherosclerotic heart disease of native coronary artery with unspecified angina pectoris: Secondary | ICD-10-CM

## 2021-02-22 NOTE — Patient Instructions (Signed)

## 2021-03-24 ENCOUNTER — Telehealth (INDEPENDENT_AMBULATORY_CARE_PROVIDER_SITE_OTHER): Payer: Self-pay

## 2021-03-24 NOTE — Telephone Encounter (Signed)
Referring MD/PCP: Karie Kirks  Procedure: Tcs  Reason/Indication:  Screening  Has patient had this procedure before?  yes  If so, when, by whom and where?  2012  Is there a family history of colon cancer?  no  Who?  What age when diagnosed?    Is patient diabetic? If yes, Type 1 or Type 2   yes Type 2      Does patient have prosthetic heart valve or mechanical valve?  no  Do you have a pacemaker/defibrillator?  no  Has patient ever had endocarditis/atrial fibrillation? Yes, may need antibiotic prior to procedure  Does patient use oxygen? no  Has patient had joint replacement within last 12 months?  no  Is patient constipated or do they take laxatives? no  Does patient have a history of alcohol/drug use?  no  Have you had a stroke/heart attack last 6 mths? no  Do you take medicine for weight loss?  no  For male patients,: do you still have your menstrual cycle? N/A  Is patient on blood thinner such as Coumadin, Plavix and/or Aspirin? yes  Medications: asa 81 mg daily, Eliquis 5 mg bid, atorvastatin 80 mg daily, benazepril 20 mg daily, chlorthalidone 25 mg daily, Verapamil 240 mg daily, CoQ 10 100 mg daily, mvi daily, prostate supplement daily,   Allergies: codeine  Medication Adjustment per Dr Vivia Budge 2 DAYS PRIOR TO PROCEDURE  Procedure date & time: 04/21/21 AT 7:30 AM

## 2021-03-24 NOTE — Telephone Encounter (Signed)
Ok to schedule.  Thanks,  Parker Wherley Castaneda Mayorga, MD Gastroenterology and Hepatology [facility]  

## 2021-03-27 ENCOUNTER — Telehealth: Payer: Self-pay | Admitting: *Deleted

## 2021-03-27 ENCOUNTER — Telehealth (INDEPENDENT_AMBULATORY_CARE_PROVIDER_SITE_OTHER): Payer: Self-pay

## 2021-03-27 ENCOUNTER — Encounter (INDEPENDENT_AMBULATORY_CARE_PROVIDER_SITE_OTHER): Payer: Self-pay | Admitting: *Deleted

## 2021-03-27 ENCOUNTER — Encounter (INDEPENDENT_AMBULATORY_CARE_PROVIDER_SITE_OTHER): Payer: Self-pay

## 2021-03-27 DIAGNOSIS — Z01818 Encounter for other preprocedural examination: Secondary | ICD-10-CM

## 2021-03-27 DIAGNOSIS — I480 Paroxysmal atrial fibrillation: Secondary | ICD-10-CM

## 2021-03-27 DIAGNOSIS — N182 Chronic kidney disease, stage 2 (mild): Secondary | ICD-10-CM

## 2021-03-27 MED ORDER — CLENPIQ 10-3.5-12 MG-GM -GM/160ML PO SOLN: 1.0000 | Freq: Once | ORAL | 0 refills | Status: AC

## 2021-03-27 NOTE — Telephone Encounter (Signed)
Kenneth Watkins, CMA  

## 2021-03-27 NOTE — Telephone Encounter (Signed)
LeighAnn Althia Egolf, CMA  

## 2021-03-27 NOTE — Telephone Encounter (Signed)
   [facility] Pre-operative Risk Assessment    Patient Name: Kenneth Watkins.  DOB: [birth year] MRN: [id number]  HEARTCARE STAFF:  - IMPORTANT!!!!!! Under Visit Info/Reason for Call, type in Other and utilize the format Clearance MM/DD/YY or Clearance TBD. Do not use dashes or single digits. - Please review there is not already an duplicate clearance open for this procedure. - If request is for dental extraction, please clarify the # of teeth to be extracted. - If the patient is currently at the dentist's office, call Pre-Op Callback Staff (MA/nurse) to input urgent request.  - If the patient is not currently in the dentist office, please route to the Pre-Op pool.  Request for surgical clearance:  What type of surgery is being performed? COLONOSCOPY  When is this surgery scheduled? 04/21/21  What type of clearance is required (medical clearance vs. Pharmacy clearance to hold med vs. Both)? PHARMACY PER CLEARANCE REQUEST  Are there any medications that need to be held prior to surgery and how long? ELIQUIS x 2 DAYS PRIOR TO PROCEDURE  Practice name and name of physician performing surgery? [facility]; DR. Maylon Peppers  What is the office phone number? [phone]   7.   What is the office fax number? [phone]  8.   Anesthesia type (None, local, MAC, general) ? MAC   Julaine Hua 03/27/2021, 3:02 PM  _________________________________________________________________   (provider comments below)

## 2021-03-28 NOTE — Telephone Encounter (Signed)
Can you please schedule patient for CBC and BMP as recommended by pharmacy.  Needed before preop clearance can be given.  Please let the patient know.

## 2021-03-28 NOTE — Telephone Encounter (Signed)
Patient with diagnosis of A Fib on Eliquis for anticoagulation.    Procedure: colonoscopy Date of procedure: 04/21/21   CHA2DS2-VASc Score = 4  This indicates a 4.8% annual risk of stroke. The patient's score is based upon: CHF History: No HTN History: Yes Diabetes History: Yes Stroke History: No Vascular Disease History: Yes Age Score: 1 Gender Score: 0    CrCl Overdue for labs Platelet count Overdue for labs  Need updated BMP/CBC to clear patient for procedure

## 2021-03-29 ENCOUNTER — Telehealth (INDEPENDENT_AMBULATORY_CARE_PROVIDER_SITE_OTHER): Payer: Self-pay

## 2021-03-29 ENCOUNTER — Other Ambulatory Visit (HOSPITAL_COMMUNITY)
Admission: RE | Admit: 2021-03-29 | Discharge: 2021-03-29 | Disposition: A | Discharge status: Home / Self Care | Payer: Medicare Other | Source: Ambulatory Visit | Attending: Cardiology | Admitting: Cardiology

## 2021-03-29 DIAGNOSIS — I480 Paroxysmal atrial fibrillation: Secondary | ICD-10-CM | POA: Insufficient documentation

## 2021-03-29 DIAGNOSIS — Z01818 Encounter for other preprocedural examination: Secondary | ICD-10-CM

## 2021-03-29 DIAGNOSIS — N182 Chronic kidney disease, stage 2 (mild): Secondary | ICD-10-CM | POA: Diagnosis present

## 2021-03-29 LAB — BASIC METABOLIC PANEL
Anion gap: 7 (ref 5–15)
BUN: 27 mg/dL — ABNORMAL HIGH (ref 8–23)
CO2: 30 mmol/L (ref 22–32)
Calcium: 9 mg/dL (ref 8.9–10.3)
Chloride: 96 mmol/L — ABNORMAL LOW (ref 98–111)
Creatinine, Ser: 1.48 mg/dL — ABNORMAL HIGH (ref 0.61–1.24)
GFR, Estimated: 50 mL/min — ABNORMAL LOW (ref >60)
Glucose, Bld: 174 mg/dL — ABNORMAL HIGH (ref 70–99)
Potassium: 4.2 mmol/L (ref 3.5–5.1)
Sodium: 133 mmol/L — ABNORMAL LOW (ref 135–145)

## 2021-03-29 LAB — CBC
HCT: 44.6 % (ref 39.0–52.0)
Hemoglobin: 15.2 g/dL (ref 13.0–17.0)
MCH: 30.5 pg (ref 26.0–34.0)
MCHC: 34.1 g/dL (ref 30.0–36.0)
MCV: 89.6 fL (ref 80.0–100.0)
Platelets: 191 10*3/uL (ref 150–400)
RBC: 4.98 MIL/uL (ref 4.22–5.81)
RDW: 12.4 % (ref 11.5–15.5)
WBC: 6.2 10*3/uL (ref 4.0–10.5)
nRBC: 0 % (ref 0.0–0.2)

## 2021-03-29 MED ORDER — PEG 3350-KCL-NA BICARB-NACL 420 G PO SOLR: 4000.0000 mL | ORAL | 0 refills | Status: DC

## 2021-03-29 NOTE — Telephone Encounter (Signed)
LeighAnn Addaleigh Nicholls, CMA  

## 2021-03-29 NOTE — Telephone Encounter (Signed)
I s/w the pt and he is agreeable to plan of care to get lab work done, CBC, BMP. Pt will have lab work done at  [facility] either on [address] or [address] sometime this by the end of this week. Pt thanked me for the call and the help. I assured the pt once we get the lab results we will let him know how long he will need to hold Eliquis prior to procedure. I will place the orders for lab work.

## 2021-03-31 NOTE — Telephone Encounter (Signed)
Labs updated for clearance:  Scr 1.48  - CrCl 54.7  Plt 191  Ok to hold Eliquis x 2 days for colonoscopoy Patient will not need lovenox bridge.

## 2021-03-31 NOTE — Telephone Encounter (Signed)
    Patient Name: Kenneth Watkins.  DOB: [birth year] MRN: [id number]  Primary Cardiologist: Rozann Lesches, MD  Chart reviewed as part of pre-operative protocol coverage.   Per pharmacy recommendations, patient can hold eliquis 2 days prior to his upcoming colonoscopy with plans to restart when cleared to do so by his gastroenterologist.  I will route this recommendation to the requesting party via [facility] fax function and remove from pre-op pool.  Please call with questions.  Abigail Butts, PA-C 03/31/2021, 3:10 PM

## 2021-04-03 ENCOUNTER — Telehealth: Payer: Self-pay | Admitting: *Deleted

## 2021-04-03 ENCOUNTER — Encounter (INDEPENDENT_AMBULATORY_CARE_PROVIDER_SITE_OTHER): Payer: Self-pay

## 2021-04-03 NOTE — Telephone Encounter (Signed)
-----   Message from Satira Sark, MD sent at 03/29/2021  4:12 PM EDT ----- Results reviewed.  Creatinine 1.48 and hemoglobin normal at 15.2.  No change in present medication.

## 2021-04-03 NOTE — Telephone Encounter (Signed)
Patient informed. Copy sent to PCP °

## 2021-04-14 NOTE — Patient Instructions (Signed)
Hau Hudecek.  04/14/2021     '@PREFPERIOPPHARMACY'$ @   Your procedure is scheduled on  04/21/2021.   Report to [facility] at  [phone]  A.M.   Call this number if you have problems the morning of surgery:  [phone]   Remember:  Follow the diet and prep instructions given to you by the office.    Take these medicines the morning of surgery with A SIP OF WATER                           thorazine, pepcid, robaxin.     Do not wear jewelry, make-up or nail polish.  Do not wear lotions, powders, or perfumes, or deodorant.  Do not shave 48 hours prior to surgery.  Men may shave face and neck.  Do not bring valuables to the hospital.  [facility] is not responsible for any belongings or valuables.  Contacts, dentures or bridgework may not be worn into surgery.  Leave your suitcase in the car.  After surgery it may be brought to your room.  For patients admitted to the hospital, discharge time will be determined by your treatment team.  Patients discharged the day of surgery will not be allowed to drive home and must have someone with them for 24 hours.    Special instructions:   DO NOT smoke tobacco or vape for 24 hours before your procedure.  Please read over the following fact sheets that you were given. Anesthesia Post-op Instructions and Care and Recovery After Surgery      Colonoscopy, Adult, Care After This sheet gives you information about how to care for yourself after your procedure. Your health care provider may also give you more specific instructions. If you have problems or questions, contact your health careprovider. What can I expect after the procedure? After the procedure, it is common to have: A small amount of blood in your stool for 24 hours after the procedure. Some gas. Mild cramping or bloating of your abdomen. Follow these instructions at home: Eating and drinking  Drink enough fluid to keep your urine pale yellow. Follow instructions from  your health care provider about eating or drinking restrictions. Resume your normal diet as instructed by your health care provider. Avoid heavy or fried foods that are hard to digest.  Activity Rest as told by your health care provider. Avoid sitting for a long time without moving. Get up to take short walks every 1-2 hours. This is important to improve blood flow and breathing. Ask for help if you feel weak or unsteady. Return to your normal activities as told by your health care provider. Ask your health care provider what activities are safe for you. Managing cramping and bloating  Try walking around when you have cramps or feel bloated. Apply heat to your abdomen as told by your health care provider. Use the heat source that your health care provider recommends, such as a moist heat pack or a heating pad. Place a towel between your skin and the heat source. Leave the heat on for 20-30 minutes. Remove the heat if your skin turns bright red. This is especially important if you are unable to feel pain, heat, or cold. You may have a greater risk of getting burned.  General instructions If you were given a sedative during the procedure, it can affect you for several hours. Do not drive or operate machinery until your  health care provider says that it is safe. For the first 24 hours after the procedure: Do not sign important documents. Do not drink alcohol. Do your regular daily activities at a slower pace than normal. Eat soft foods that are easy to digest. Take over-the-counter and prescription medicines only as told by your health care provider. Keep all follow-up visits as told by your health care provider. This is important. Contact a health care provider if: You have blood in your stool 2-3 days after the procedure. Get help right away if you have: More than a small spotting of blood in your stool. Large blood clots in your stool. Swelling of your abdomen. Nausea or vomiting. A  fever. Increasing pain in your abdomen that is not relieved with medicine. Summary After the procedure, it is common to have a small amount of blood in your stool. You may also have mild cramping and bloating of your abdomen. If you were given a sedative during the procedure, it can affect you for several hours. Do not drive or operate machinery until your health care provider says that it is safe. Get help right away if you have a lot of blood in your stool, nausea or vomiting, a fever, or increased pain in your abdomen. This information is not intended to replace advice given to you by your health care provider. Make sure you discuss any questions you have with your healthcare provider. Document Revised: 08/28/2019 Document Reviewed: 03/30/2019 Elsevier Patient Education  [place] After This sheet gives you information about how to care for yourself after your procedure. Your health care provider may also give you more specific instructions. If you have problems or questions, contact your health careprovider. What can I expect after the procedure? After the procedure, it is common to have: Tiredness. Forgetfulness about what happened after the procedure. Impaired judgment for important decisions. Nausea or vomiting. Some difficulty with balance. Follow these instructions at home: For the time period you were told by your health care provider:     Rest as needed. Do not participate in activities where you could fall or become injured. Do not drive or use machinery. Do not drink alcohol. Do not take sleeping pills or medicines that cause drowsiness. Do not make important decisions or sign legal documents. Do not take care of children on your own. Eating and drinking Follow the diet that is recommended by your health care provider. Drink enough fluid to keep your urine pale yellow. If you vomit: Drink water, juice, or soup when you can drink  without vomiting. Make sure you have little or no nausea before eating solid foods. General instructions Have a responsible adult stay with you for the time you are told. It is important to have someone help care for you until you are awake and alert. Take over-the-counter and prescription medicines only as told by your health care provider. If you have sleep apnea, surgery and certain medicines can increase your risk for breathing problems. Follow instructions from your health care provider about wearing your sleep device: Anytime you are sleeping, including during daytime naps. While taking prescription pain medicines, sleeping medicines, or medicines that make you drowsy. Avoid smoking. Keep all follow-up visits as told by your health care provider. This is important. Contact a health care provider if: You keep feeling nauseous or you keep vomiting. You feel light-headed. You are still sleepy or having trouble with balance after 24 hours. You develop a rash. You  have a fever. You have redness or swelling around the IV site. Get help right away if: You have trouble breathing. You have new-onset confusion at home. Summary For several hours after your procedure, you may feel tired. You may also be forgetful and have poor judgment. Have a responsible adult stay with you for the time you are told. It is important to have someone help care for you until you are awake and alert. Rest as told. Do not drive or operate machinery. Do not drink alcohol or take sleeping pills. Get help right away if you have trouble breathing, or if you suddenly become confused. This information is not intended to replace advice given to you by your health care provider. Make sure you discuss any questions you have with your healthcare provider. Document Revised: 05/19/2020 Document Reviewed: 08/06/2019 Elsevier Patient Education  2022 [employer].

## 2021-04-19 ENCOUNTER — Other Ambulatory Visit (HOSPITAL_COMMUNITY): Payer: Medicare Other

## 2021-04-19 ENCOUNTER — Other Ambulatory Visit: Payer: Self-pay

## 2021-04-19 ENCOUNTER — Encounter (HOSPITAL_COMMUNITY)
Admission: RE | Admit: 2021-04-19 | Discharge: 2021-04-19 | Disposition: A | Discharge status: Home / Self Care | Payer: Medicare Other | Source: Ambulatory Visit | Attending: Gastroenterology | Admitting: Gastroenterology

## 2021-04-21 ENCOUNTER — Ambulatory Visit (HOSPITAL_COMMUNITY): Payer: Medicare Other | Admitting: Anesthesiology

## 2021-04-21 ENCOUNTER — Other Ambulatory Visit: Payer: Self-pay

## 2021-04-21 ENCOUNTER — Encounter (HOSPITAL_COMMUNITY)
Admission: RE | Disposition: A | Discharge status: Home / Self Care | Payer: Self-pay | Source: Home / Self Care | Attending: Gastroenterology

## 2021-04-21 ENCOUNTER — Encounter (HOSPITAL_COMMUNITY): Payer: Self-pay | Admitting: Gastroenterology

## 2021-04-21 ENCOUNTER — Ambulatory Visit (HOSPITAL_COMMUNITY)
Admission: RE | Admit: 2021-04-21 | Discharge: 2021-04-21 | Disposition: A | Discharge status: Home / Self Care | Payer: Medicare Other | Attending: Gastroenterology | Admitting: Gastroenterology

## 2021-04-21 DIAGNOSIS — K5730 Diverticulosis of large intestine without perforation or abscess without bleeding: Secondary | ICD-10-CM

## 2021-04-21 DIAGNOSIS — K648 Other hemorrhoids: Secondary | ICD-10-CM

## 2021-04-21 DIAGNOSIS — Z885 Allergy status to narcotic agent status: Secondary | ICD-10-CM | POA: Diagnosis not present

## 2021-04-21 DIAGNOSIS — E785 Hyperlipidemia, unspecified: Secondary | ICD-10-CM | POA: Diagnosis not present

## 2021-04-21 DIAGNOSIS — Z833 Family history of diabetes mellitus: Secondary | ICD-10-CM | POA: Insufficient documentation

## 2021-04-21 DIAGNOSIS — N400 Enlarged prostate without lower urinary tract symptoms: Secondary | ICD-10-CM | POA: Diagnosis not present

## 2021-04-21 DIAGNOSIS — I2510 Atherosclerotic heart disease of native coronary artery without angina pectoris: Secondary | ICD-10-CM | POA: Diagnosis not present

## 2021-04-21 DIAGNOSIS — N183 Chronic kidney disease, stage 3 unspecified: Secondary | ICD-10-CM | POA: Diagnosis not present

## 2021-04-21 DIAGNOSIS — I129 Hypertensive chronic kidney disease with stage 1 through stage 4 chronic kidney disease, or unspecified chronic kidney disease: Secondary | ICD-10-CM | POA: Insufficient documentation

## 2021-04-21 DIAGNOSIS — E1122 Type 2 diabetes mellitus with diabetic chronic kidney disease: Secondary | ICD-10-CM | POA: Diagnosis not present

## 2021-04-21 DIAGNOSIS — Z955 Presence of coronary angioplasty implant and graft: Secondary | ICD-10-CM | POA: Diagnosis not present

## 2021-04-21 DIAGNOSIS — Z7901 Long term (current) use of anticoagulants: Secondary | ICD-10-CM | POA: Diagnosis not present

## 2021-04-21 DIAGNOSIS — Z823 Family history of stroke: Secondary | ICD-10-CM | POA: Insufficient documentation

## 2021-04-21 DIAGNOSIS — Z8049 Family history of malignant neoplasm of other genital organs: Secondary | ICD-10-CM | POA: Insufficient documentation

## 2021-04-21 DIAGNOSIS — D123 Benign neoplasm of transverse colon: Secondary | ICD-10-CM

## 2021-04-21 DIAGNOSIS — Z79899 Other long term (current) drug therapy: Secondary | ICD-10-CM | POA: Insufficient documentation

## 2021-04-21 DIAGNOSIS — Z1211 Encounter for screening for malignant neoplasm of colon: Secondary | ICD-10-CM | POA: Diagnosis present

## 2021-04-21 DIAGNOSIS — I4891 Unspecified atrial fibrillation: Secondary | ICD-10-CM | POA: Insufficient documentation

## 2021-04-21 DIAGNOSIS — Z7951 Long term (current) use of inhaled steroids: Secondary | ICD-10-CM | POA: Insufficient documentation

## 2021-04-21 DIAGNOSIS — Z7982 Long term (current) use of aspirin: Secondary | ICD-10-CM | POA: Diagnosis not present

## 2021-04-21 HISTORY — PX: COLONOSCOPY WITH PROPOFOL: SHX5780

## 2021-04-21 LAB — GLUCOSE, CAPILLARY: Glucose-Capillary: 108 mg/dL — ABNORMAL HIGH (ref 70–99)

## 2021-04-21 LAB — HM COLONOSCOPY

## 2021-04-21 SURGERY — COLONOSCOPY WITH PROPOFOL
Anesthesia: General

## 2021-04-21 MED ORDER — STERILE WATER FOR IRRIGATION IR SOLN
Status: DC | PRN
  Administered 2021-04-21: 200 mL

## 2021-04-21 MED ORDER — PROPOFOL 10 MG/ML IV BOLUS
INTRAVENOUS | Status: DC | PRN
  Administered 2021-04-21: 40 mg via INTRAVENOUS
  Administered 2021-04-21: 80 mg via INTRAVENOUS
  Administered 2021-04-21: 50 mg via INTRAVENOUS
  Administered 2021-04-21: 30 mg via INTRAVENOUS
  Administered 2021-04-21: 50 mg via INTRAVENOUS

## 2021-04-21 MED ORDER — LACTATED RINGERS IV SOLN: INTRAVENOUS | Status: DC

## 2021-04-21 MED ORDER — LIDOCAINE HCL (PF) 2 % IJ SOLN
INTRAMUSCULAR | Status: AC
  Filled 2021-04-21: qty 10

## 2021-04-21 MED ORDER — PROPOFOL 1000 MG/100ML IV EMUL
INTRAVENOUS | Status: AC
  Filled 2021-04-21: qty 100

## 2021-04-21 NOTE — Op Note (Signed)
[facility] Patient Name: Kenneth Watkins Procedure Date: 04/21/2021 7:04 AM MRN: [id number] Date of Birth: 13-Nov-1945 Attending MD: Maylon Peppers ,  CSN: [id number] Age: 75 Admit Type: Outpatient Procedure:                Colonoscopy Indications:              Screening for colorectal malignant neoplasm Providers:                Maylon Peppers, Lurline Del, RN, Aram Candela Referring MD:              Medicines:                Monitored Anesthesia Care Complications:            No immediate complications. Estimated Blood Loss:     Estimated blood loss: none. Procedure:                Pre-Anesthesia Assessment:                           - Prior to the procedure, a History and Physical                            was performed, and patient medications, allergies                            and sensitivities were reviewed. The patient's                            tolerance of previous anesthesia was reviewed.                           - The risks and benefits of the procedure and the                            sedation options and risks were discussed with the                            patient. All questions were answered and informed                            consent was obtained.                           - ASA Grade Assessment: III - A patient with severe                            systemic disease.                           After obtaining informed consent, the colonoscope                            was passed under direct vision. Throughout the                            procedure, the patient's blood pressure,  pulse, and                            oxygen saturations were monitored continuously. The                            [id number]-HQ190L [id number] scope was introduced through                            the anus and advanced to the the cecum, identified                            by appendiceal orifice and ileocecal valve. The                            colonoscopy was performed  without difficulty. The                            patient tolerated the procedure well. The quality                            of the bowel preparation was adequate. Scope In: 7:29:50 AM Scope Out: 7:53:49 AM Scope Withdrawal Time: 0 hours 18 minutes 29 seconds  Total Procedure Duration: 0 hours 23 minutes 59 seconds  Findings:      The perianal and digital rectal examinations were normal.      A 2 mm polyp was found in the transverse colon. The polyp was sessile.       The polyp was removed with a cold biopsy forceps. Resection and       retrieval were complete.      A few small-mouthed diverticula were found in the descending colon.      Non-bleeding internal hemorrhoids were found during retroflexion. The       hemorrhoids were small. Impression:               - One 2 mm polyp in the transverse colon, removed                            with a cold biopsy forceps. Resected and retrieved.                           - Diverticulosis in the descending colon.                           - Non-bleeding internal hemorrhoids. Moderate Sedation:      Per Anesthesia Care Recommendation:           - Discharge patient to home (ambulatory).                           - Resume previous diet.                           - Await pathology results.                           - Repeat  colonoscopy for surveillance based on                            pathology results.                           - Resume Eliquis (apixaban) at prior dose today. Procedure Code(s):        --- Professional ---                           [phone], Colonoscopy, flexible; with biopsy, single                            or multiple Diagnosis Code(s):        --- Professional ---                           Z12.11, Encounter for screening for malignant                            neoplasm of colon                           K63.5, Polyp of colon                           K64.8, Other hemorrhoids                           K57.30,  Diverticulosis of large intestine without                            perforation or abscess without bleeding CPT copyright 2019 American Medical Association. All rights reserved. The codes documented in this report are preliminary and upon coder review may  be revised to meet current compliance requirements. Maylon Peppers, MD Maylon Peppers,  04/21/2021 8:02:36 AM This report has been signed electronically. Number of Addenda: 0

## 2021-04-21 NOTE — Discharge Instructions (Addendum)
You are being discharged to home.  Resume your previous diet.  We are waiting for your pathology results.  Your physician has recommended a repeat colonoscopy for surveillance based on pathology results.  Resume taking Eliquis (apixaban) at your prior dose today.  PATIENT INSTRUCTIONS POST-ANESTHESIA  IMMEDIATELY FOLLOWING SURGERY:  Do not drive or operate machinery for the first twenty four hours after surgery.  Do not make any important decisions for twenty four hours after surgery or while taking narcotic pain medications or sedatives.  If you develop intractable nausea and vomiting or a severe headache please notify your doctor immediately.  FOLLOW-UP:  Please make an appointment with your surgeon as instructed. You do not need to follow up with anesthesia unless specifically instructed to do so.  WOUND CARE INSTRUCTIONS (if applicable):  Keep a dry clean dressing on the anesthesia/puncture wound site if there is drainage.  Once the wound has quit draining you may leave it open to air.  Generally you should leave the bandage intact for twenty four hours unless there is drainage.  If the epidural site drains for more than 36-48 hours please call the anesthesia department.  QUESTIONS?:  Please feel free to call your physician or the hospital operator if you have any questions, and they will be happy to assist you.

## 2021-04-21 NOTE — H&P (Signed)
Kenneth Watkins. is an 75 y.o. male.   Chief Complaint: colorectal cancer screening HPI: 75 y/o M with PMH coronary artery disease status post stent placement, BPH, CKD, hyperlipidemia, hypertension, atrial fibrillation and type 2 diabetes, coming for screening colonoscopy.  Last colonoscopy was performed 10 years ago per the patient, he was performed at [facility], he reports that he was completely normal.  The patient denies having any complaints such as melena, hematochezia, abdominal pain or distention, change in her bowel movement consistency or frequency, no changes in her weight recently.  No family history of colorectal cancer.   Past Medical History:  Diagnosis Date   BPH (benign prostatic hyperplasia)    CAD (coronary artery disease)    a. s/p DES to OM1 and DES to RCA in 02/2020   CKD (chronic kidney disease) stage 3, GFR 30-59 ml/min (HCC)    Hyperlipidemia    Hypertension    Persistent atrial fibrillation ([place])    Pinched nerve in neck    Type 2 diabetes mellitus ([place])     Past Surgical History:  Procedure Laterality Date   CORONARY STENT INTERVENTION N/A 02/24/2020   Procedure: CORONARY STENT INTERVENTION;  Surgeon: Troy Sine, MD;  Location: [place] [facility];  Service: Cardiovascular;  Laterality: N/A;   EYE SURGERY     LEFT HEART CATH AND CORONARY ANGIOGRAPHY N/A 02/23/2020   Procedure: LEFT HEART CATH AND CORONARY ANGIOGRAPHY;  Surgeon: Troy Sine, MD;  Location: [place];  Service: Cardiovascular;  Laterality: N/A;   TONSILLECTOMY     VASECTOMY      Family History  Problem Relation Age of Onset   Uterine cancer Mother    Diabetes Mother    Stroke Mother    Heart attack Father    Stroke Father    Social History:  reports that he has never smoked. He has never used smokeless tobacco. He reports that he does not drink alcohol and does not use drugs.  Allergies:  Allergies  Allergen Reactions   Codeine Nausea And Vomiting    Medications Prior to  Admission  Medication Sig Dispense Refill   apixaban (ELIQUIS) 5 MG TABS tablet Take 5 mg by mouth 2 (two) times daily.     aspirin EC 81 MG tablet Take 1 tablet (81 mg total) by mouth daily. Swallow whole. Start when you stop plavix 90 tablet 3   atorvastatin (LIPITOR) 80 MG tablet Take 1 tablet (80 mg total) by mouth daily. 90 tablet 3   benazepril (LOTENSIN) 20 MG tablet Take 20 mg by mouth daily.     chlorthalidone (HYGROTON) 25 MG tablet Take 25 mg by mouth daily.     clotrimazole-betamethasone (LOTRISONE) cream Apply 1 application topically daily as needed (Rash).      Coenzyme Q10 (CO Q 10 PO) Take 1 capsule by mouth daily.     CONTOUR NEXT TEST test strip 1 each by Other route daily.     famotidine (PEPCID) 10 MG tablet Take 10 mg by mouth daily as needed for heartburn or indigestion.     Misc Natural Products (MENS PROSTATE HEALTH FORMULA PO) Take 1 tablet by mouth in the morning and at bedtime. Prostate Formula: [employer]     Misc Natural Products (OSTEO BI-FLEX TRIPLE STRENGTH PO) Take 1 tablet by mouth in the morning and at bedtime.     Multiple Vitamins-Minerals (MULTIVITAMIN WITH MINERALS) tablet Take 1 tablet by mouth daily. Men's     nitroGLYCERIN (NITROSTAT)  0.4 MG SL tablet Place 1 tablet (0.4 mg total) under the tongue every 5 (five) minutes as needed for chest pain. 25 tablet 1   polyethylene glycol-electrolytes (TRILYTE) 420 g solution Take 4,000 mLs by mouth as directed. 4000 mL 0   tadalafil (CIALIS) 10 MG tablet Take 1 tablet by mouth as needed for erectile dysfunction.     testosterone cypionate (DEPOTESTOSTERONE CYPIONATE) 200 MG/ML injection Inject 200 mg into the muscle every 14 (fourteen) days.      verapamil (CALAN-SR) 120 MG CR tablet TAKE 1 TABLET (120 MG TOTAL) BY MOUTH AT BEDTIME. 90 tablet 3   Accu-Chek Softclix Lancets lancets 1 each by Other route as needed.     B-D 3CC LUER-LOK SYR 22GX1" 22G X 1" 3 ML MISC 1 each by Other route as needed.     BD DISP  NEEDLES 18G X 1-1/2" MISC 1 each by Other route as needed.     chlorproMAZINE (THORAZINE) 25 MG tablet Take 1 tablet (25 mg total) by mouth 3 (three) times daily as needed. For hiccups 12 tablet 0   methocarbamol (ROBAXIN) 500 MG tablet Take 500 mg by mouth daily as needed for muscle spasms.      mometasone (NASONEX) 50 MCG/ACT nasal spray Place 2 sprays into the nose daily as needed (allergies).      Results for orders placed or performed during the hospital encounter of 04/21/21 (from the past 48 hour(s))  Glucose, capillary     Status: Abnormal   Collection Time: 04/21/21  6:21 AM  Result Value Ref Range   Glucose-Capillary 108 (H) 70 - 99 mg/dL    Comment: Glucose reference range applies only to samples taken after fasting for at least 8 hours.   No results found.  Review of Systems  Constitutional: Negative.   HENT: Negative.    Eyes: Negative.   Respiratory: Negative.    Cardiovascular: Negative.   Gastrointestinal: Negative.   Endocrine: Negative.   Genitourinary: Negative.   Musculoskeletal: Negative.   Skin: Negative.   Allergic/Immunologic: Negative.   Neurological: Negative.   Hematological: Negative.   Psychiatric/Behavioral: Negative.     Blood pressure (!) 156/74, pulse 77, temperature 97.8 F (36.6 C), temperature source Oral, resp. rate 14, SpO2 99 %. Physical Exam  GENERAL: The patient is AO x3, in no acute distress. HEENT: Head is normocephalic and atraumatic. EOMI are intact. Mouth is well hydrated and without lesions. NECK: Supple. No masses LUNGS: Clear to auscultation. No presence of rhonchi/wheezing/rales. Adequate chest expansion HEART: RRR, normal s1 and s2. ABDOMEN: Soft, nontender, no guarding, no peritoneal signs, and nondistended. BS +. No masses. EXTREMITIES: Without any cyanosis, clubbing, rash, lesions or edema. NEUROLOGIC: AOx3, no focal motor deficit. SKIN: no jaundice, no rashes  Assessment/Plan 75 y/o M with PMH coronary artery disease  status post stent placement, BPH, CKD, hyperlipidemia, hypertension, atrial fibrillation and type 2 diabetes, coming for screening colonoscopy. The patient is at average risk for colorectal cancer.  We will proceed with colonoscopy today.   Harvel Quale, MD 04/21/2021, 7:05 AM

## 2021-04-21 NOTE — Transfer of Care (Signed)
Immediate Anesthesia Transfer of Care Note  Patient: Kenneth Watkins.  Procedure(s) Performed: COLONOSCOPY WITH PROPOFOL  Patient Location: Short Stay  Anesthesia Type:General  Level of Consciousness: awake, alert  and oriented  Airway & Oxygen Therapy: Patient Spontanous Breathing  Post-op Assessment: Report given to RN and Post -op Vital signs reviewed and stable  Post vital signs: Reviewed and stable  Last Vitals:  Vitals Value Taken Time  BP    Temp    Pulse 64 04/21/21  0759  Resp    SpO2 97  04/21/21  0759    Last Pain:  Vitals:   04/21/21 0645  TempSrc: Oral  PainSc: 0-No pain         Complications: No notable events documented.

## 2021-04-21 NOTE — Anesthesia Preprocedure Evaluation (Signed)
Anesthesia Evaluation  Patient identified by MRN, date of birth, ID band Patient awake    Reviewed: Allergy & Precautions, NPO status , Patient's Chart, lab work & pertinent test results  History of Anesthesia Complications Negative for: history of anesthetic complications  Airway Mallampati: II  TM Distance: >3 FB Neck ROM: Full   Comment: pinched nerve in Neck Dental  (+) Dental Advisory Given, Teeth Intact   Pulmonary neg pulmonary ROS,    Pulmonary exam normal breath sounds clear to auscultation       Cardiovascular Exercise Tolerance: Good hypertension, Pt. on medications + CAD and + Cardiac Stents (02/23/2020)  Normal cardiovascular exam+ dysrhythmias (on eliquis) Atrial Fibrillation  Rhythm:Regular Rate:Normal     Neuro/Psych  Neuromuscular disease (pinched nerve in Neck)    GI/Hepatic Neg liver ROS, GERD  Medicated,  Endo/Other  diabetes, Well Controlled, Type 2, Oral Hypoglycemic Agents  Renal/GU Renal InsufficiencyRenal disease     Musculoskeletal negative musculoskeletal ROS (+)   Abdominal   Peds  Hematology negative hematology ROS (+)   Anesthesia Other Findings   Reproductive/Obstetrics negative OB ROS                            Anesthesia Physical Anesthesia Plan  ASA: 3  Anesthesia Plan: General   Post-op Pain Management:    Induction:   PONV Risk Score and Plan: Propofol infusion  Airway Management Planned: Nasal Cannula and Natural Airway  Additional Equipment:   Intra-op Plan:   Post-operative Plan:   Informed Consent: I have reviewed the patients History and Physical, chart, labs and discussed the procedure including the risks, benefits and alternatives for the proposed anesthesia with the patient or authorized representative who has indicated his/her understanding and acceptance.       Plan Discussed with: CRNA and Surgeon  Anesthesia Plan  Comments:         Anesthesia Quick Evaluation

## 2021-04-21 NOTE — Anesthesia Postprocedure Evaluation (Signed)
Anesthesia Post Note  Patient: Kenneth Watkins.  Procedure(s) Performed: COLONOSCOPY WITH PROPOFOL  Patient location during evaluation: Phase II Anesthesia Type: General Level of consciousness: awake and alert and oriented Pain management: pain level controlled Vital Signs Assessment: post-procedure vital signs reviewed and stable Respiratory status: spontaneous breathing and respiratory function stable Cardiovascular status: blood pressure returned to baseline and stable Postop Assessment: no apparent nausea or vomiting Anesthetic complications: no   No notable events documented.   Last Vitals:  Vitals:   04/21/21 0645 04/21/21 0759  BP: (!) 156/74 119/60  Pulse: 77 65  Resp: 14 16  Temp: 36.6 C 36.5 C  SpO2: 99% 100%    Last Pain:  Vitals:   04/21/21 0759  TempSrc: Oral  PainSc: 0-No pain                 Alece Koppel C Nuri Larmer

## 2021-04-25 ENCOUNTER — Encounter (INDEPENDENT_AMBULATORY_CARE_PROVIDER_SITE_OTHER): Payer: Self-pay | Admitting: *Deleted

## 2021-04-25 LAB — SURGICAL PATHOLOGY

## 2021-05-01 ENCOUNTER — Encounter (HOSPITAL_COMMUNITY): Payer: Self-pay | Admitting: Gastroenterology

## 2021-05-23 ENCOUNTER — Encounter: Payer: Self-pay | Admitting: *Deleted

## 2021-05-23 ENCOUNTER — Ambulatory Visit (HOSPITAL_COMMUNITY)
Admission: RE | Admit: 2021-05-23 | Discharge: 2021-05-23 | Disposition: A | Discharge status: Home / Self Care | Payer: Medicare Other | Source: Ambulatory Visit | Attending: Physician Assistant | Admitting: Physician Assistant

## 2021-05-23 ENCOUNTER — Encounter (HOSPITAL_COMMUNITY): Payer: Self-pay | Admitting: Physician Assistant

## 2021-05-23 ENCOUNTER — Other Ambulatory Visit: Payer: Self-pay

## 2021-05-23 VITALS — BP 148/70 | HR 81 | Ht 73.0 in | Wt 190.4 lb

## 2021-05-23 DIAGNOSIS — E1122 Type 2 diabetes mellitus with diabetic chronic kidney disease: Secondary | ICD-10-CM | POA: Insufficient documentation

## 2021-05-23 DIAGNOSIS — I2510 Atherosclerotic heart disease of native coronary artery without angina pectoris: Secondary | ICD-10-CM | POA: Insufficient documentation

## 2021-05-23 DIAGNOSIS — I10 Essential (primary) hypertension: Secondary | ICD-10-CM | POA: Diagnosis not present

## 2021-05-23 DIAGNOSIS — D6869 Other thrombophilia: Secondary | ICD-10-CM | POA: Diagnosis not present

## 2021-05-23 DIAGNOSIS — E785 Hyperlipidemia, unspecified: Secondary | ICD-10-CM | POA: Insufficient documentation

## 2021-05-23 DIAGNOSIS — Z7982 Long term (current) use of aspirin: Secondary | ICD-10-CM | POA: Insufficient documentation

## 2021-05-23 DIAGNOSIS — I129 Hypertensive chronic kidney disease with stage 1 through stage 4 chronic kidney disease, or unspecified chronic kidney disease: Secondary | ICD-10-CM | POA: Diagnosis not present

## 2021-05-23 DIAGNOSIS — Z951 Presence of aortocoronary bypass graft: Secondary | ICD-10-CM | POA: Diagnosis not present

## 2021-05-23 DIAGNOSIS — I480 Paroxysmal atrial fibrillation: Secondary | ICD-10-CM | POA: Diagnosis present

## 2021-05-23 DIAGNOSIS — I440 Atrioventricular block, first degree: Secondary | ICD-10-CM | POA: Insufficient documentation

## 2021-05-23 DIAGNOSIS — Z79899 Other long term (current) drug therapy: Secondary | ICD-10-CM | POA: Diagnosis not present

## 2021-05-23 DIAGNOSIS — N183 Chronic kidney disease, stage 3 unspecified: Secondary | ICD-10-CM | POA: Diagnosis not present

## 2021-05-23 DIAGNOSIS — Z7901 Long term (current) use of anticoagulants: Secondary | ICD-10-CM | POA: Insufficient documentation

## 2021-05-23 DIAGNOSIS — Z955 Presence of coronary angioplasty implant and graft: Secondary | ICD-10-CM | POA: Insufficient documentation

## 2021-05-23 NOTE — Telephone Encounter (Signed)
This encounter was created in error - please disregard.

## 2021-05-23 NOTE — Telephone Encounter (Signed)
Reports HR today is 99 per Apple watch Has not checked BP Reports fatigue and weak Denies chest pain, sob or dizziness Advised that he would be contacted with an appointment in the [facility]

## 2021-05-23 NOTE — Progress Notes (Signed)
Primary Care Physician: Lemmie Evens, MD Primary Cardiologist: Dr Domenic Polite Primary Electrophysiologist: none Referring Physician: [facility]    Kenneth Watkins. is a 75 y.o. male with a history of CAD, DM, CKD, HLD, HTN, atrial fibrillation who presents for consultation in the [facility].  The patient was initially diagnosed with atrial fibrillation 09/2017. Patient is on Eliquis for a CHADS2VASC score of 4. He was in his usual state of health until 05/21/21 when he noted fatigue, headache, SOB, and chest soreness while playing golf. His Apple Watch showed afib with RVR. This continued until 05/22/21. He does still feel fatigued today but otherwise he is back to baseline. There was no specific trigger that he could identify.   Today, he denies symptoms of orthopnea, PND, lower extremity edema, dizziness, presyncope, syncope, snoring, daytime somnolence, bleeding, or neurologic sequela. The patient is tolerating medications without difficulties and is otherwise without complaint today.    Atrial Fibrillation Risk Factors:  he does not have symptoms or diagnosis of sleep apnea. he does not have a history of rheumatic fever. he does not have a history of alcohol use. The patient does have a history of early familial atrial fibrillation or other arrhythmias. Several siblings and mother have afib.  he has a BMI of Body mass index is 25.12 kg/m.Marland Kitchen Filed Weights   05/23/21 1400  Weight: 86.4 kg    Family History  Problem Relation Age of Onset   Uterine cancer Mother    Diabetes Mother    Stroke Mother    Heart attack Father    Stroke Father      Atrial Fibrillation Management history:  Previous antiarrhythmic drugs: none Previous cardioversions: none Previous ablations: none CHADS2VASC score: 4 Anticoagulation history: Eliquis   Past Medical History:  Diagnosis Date   BPH (benign prostatic hyperplasia)    CAD (coronary artery disease)    a.  s/p DES to OM1 and DES to RCA in 02/2020   CKD (chronic kidney disease) stage 3, GFR 30-59 ml/min (HCC)    Hyperlipidemia    Hypertension    Persistent atrial fibrillation ([place])    Pinched nerve in neck    Type 2 diabetes mellitus ([place])    Past Surgical History:  Procedure Laterality Date   COLONOSCOPY WITH PROPOFOL N/A 04/21/2021   Procedure: COLONOSCOPY WITH PROPOFOL;  Surgeon: Harvel Quale, MD;  [facility];  Service: Gastroenterology;  Laterality: N/A;  7:30   CORONARY STENT INTERVENTION N/A 02/24/2020   Procedure: CORONARY STENT INTERVENTION;  Surgeon: Troy Sine, MD;  Location: [place];  Service: Cardiovascular;  Laterality: N/A;   EYE SURGERY     LEFT HEART CATH AND CORONARY ANGIOGRAPHY N/A 02/23/2020   Procedure: LEFT HEART CATH AND CORONARY ANGIOGRAPHY;  Surgeon: Troy Sine, MD;  Location: [place] [facility];  Service: Cardiovascular;  Laterality: N/A;   TONSILLECTOMY     VASECTOMY      Current Outpatient Medications  Medication Sig Dispense Refill   Accu-Chek Softclix Lancets lancets 1 each by Other route as needed.     acetaminophen (TYLENOL) 650 MG CR tablet Take 650 mg by mouth every 8 (eight) hours as needed for pain.     apixaban (ELIQUIS) 5 MG TABS tablet Take 5 mg by mouth 2 (two) times daily.     aspirin EC 81 MG tablet Take 1 tablet (81 mg total) by mouth daily. Swallow whole. Start when you stop plavix 90 tablet  3   atorvastatin (LIPITOR) 80 MG tablet Take 1 tablet (80 mg total) by mouth daily. 90 tablet 3   B-D 3CC LUER-LOK SYR 22GX1" 22G X 1" 3 ML MISC 1 each by Other route as needed.     BD DISP NEEDLES 18G X 1-1/2" MISC 1 each by Other route as needed.     benazepril (LOTENSIN) 20 MG tablet Take 20 mg by mouth daily.     chlorthalidone (HYGROTON) 25 MG tablet Take 25 mg by mouth daily.     clotrimazole-betamethasone (LOTRISONE) cream Apply 1 application topically daily as needed (Rash).      Coenzyme Q10 (CO Q 10 PO)  Take 1 capsule by mouth daily.     CONTOUR NEXT TEST test strip 1 each by Other route daily.     famotidine (PEPCID) 10 MG tablet Take 10 mg by mouth daily as needed for heartburn or indigestion.     methocarbamol (ROBAXIN) 500 MG tablet Take 500 mg by mouth daily as needed for muscle spasms.      Misc Natural Products (MENS PROSTATE HEALTH FORMULA PO) Take 1 tablet by mouth in the morning and at bedtime. Prostate Formula: Real Health     Misc Natural Products (OSTEO BI-FLEX TRIPLE STRENGTH PO) Take 1 tablet by mouth in the morning and at bedtime.     mometasone (NASONEX) 50 MCG/ACT nasal spray Place 2 sprays into the nose daily as needed (allergies).     Multiple Vitamins-Minerals (MULTIVITAMIN WITH MINERALS) tablet Take 1 tablet by mouth daily. Men's     nitroGLYCERIN (NITROSTAT) 0.4 MG SL tablet Place 1 tablet (0.4 mg total) under the tongue every 5 (five) minutes as needed for chest pain. 25 tablet 1   tadalafil (CIALIS) 10 MG tablet Take 1 tablet by mouth as needed for erectile dysfunction.     testosterone cypionate (DEPOTESTOSTERONE CYPIONATE) 200 MG/ML injection Inject 200 mg into the muscle every 14 (fourteen) days.      verapamil (CALAN-SR) 120 MG CR tablet TAKE 1 TABLET (120 MG TOTAL) BY MOUTH AT BEDTIME. 90 tablet 3   No current facility-administered medications for this encounter.    Allergies  Allergen Reactions   Codeine Nausea And Vomiting    Social History   Socioeconomic History   Marital status: Married    Spouse name: Not on file   Number of children: Not on file   Years of education: Not on file   Highest education level: Not on file  Occupational History   Occupation: Pharmacologist  Tobacco Use   Smoking status: Never   Smokeless tobacco: Never  Vaping Use   Vaping Use: Never used  Substance and Sexual Activity   Alcohol use: No   Drug use: No   Sexual activity: Not on file  Other Topics Concern   Not on file  Social History Narrative   Not on  file   Social Determinants of Health   Financial Resource Strain: Not on file  Food Insecurity: Not on file  Transportation Needs: Not on file  Physical Activity: Not on file  Stress: Not on file  Social Connections: Not on file  Intimate Partner Violence: Not on file     ROS- All systems are reviewed and negative except as per the HPI above.  Physical Exam: Vitals:   05/23/21 1400  BP: (!) 148/70  Pulse: 81  Weight: 86.4 kg  Height: '6\' 1"'$  (1.854 m)    GEN- The patient is a well appearing  male, alert and oriented x 3 today.   Head- normocephalic, atraumatic Eyes-  Sclera clear, conjunctiva pink Ears- hearing intact Oropharynx- clear Neck- supple  Lungs- Clear to ausculation bilaterally, normal work of breathing Heart- Regular rate and rhythm, no murmurs, rubs or gallops  GI- soft, NT, ND, + BS Extremities- no clubbing, cyanosis, or edema MS- no significant deformity or atrophy Skin- no rash or lesion Psych- euthymic mood, full affect Neuro- strength and sensation are intact  Wt Readings from Last 3 Encounters:  05/23/21 86.4 kg  04/19/21 85.3 kg  02/22/21 87 kg    EKG today demonstrates  SR, 1st degree AV block Vent. rate 81 BPM PR interval 228 ms QRS duration 80 ms QT/QTcB 362/420 ms  Echo 11/21/17 demonstrated  EF 60-65% Mild septal hypertrophy   Epic records are reviewed at length today   CHA2DS2-VASc Score = 4  The patient's score is based upon: CHF History: No HTN History: Yes Diabetes History: Yes Stroke History: No Vascular Disease History: Yes Age Score: 1 Gender Score: 0       ASSESSMENT AND PLAN: 1. Paroxysmal Atrial Fibrillation (ICD10:  I48.0) The patient's CHA2DS2-VASc score is 4, indicating a 4.8% annual risk of stroke.   Patient back in SR.  We discussed AAD vs ablation if his afib should become more persistent. This is is first episode in months.  Continue verapamil 120 mg daily Continue Eliquis 5 mg BID Apple Watch for home  monitoring.   2. Secondary Hypercoagulable State (ICD10:  D68.69) The patient is at significant risk for stroke/thromboembolism based upon his CHA2DS2-VASc Score of 4.  Continue Apixaban (Eliquis).   3. CAD Patient did have some "chest soreness" while in afib. Suspect rate related. Had PCI 02/2020. Will have him follow up with primary cardiologist.  4. HTN Stable, no changes today.   Follow up with Dr McDowell's office.   [facility] [address] [place], [place] [address] [phone] 05/23/2021 3:57 PM

## 2021-05-23 NOTE — Telephone Encounter (Signed)
Scheduled with Kenneth Watkins today at 2:00 pm in [facility] Patient aware Advised if symptoms get worse between now and then to go to the ED for an evaluation. Verbalized understanding of plan.

## 2021-06-28 ENCOUNTER — Ambulatory Visit (INDEPENDENT_AMBULATORY_CARE_PROVIDER_SITE_OTHER): Payer: Medicare Other | Admitting: Cardiology

## 2021-06-28 ENCOUNTER — Encounter: Payer: Self-pay | Admitting: Cardiology

## 2021-06-28 VITALS — BP 132/62 | HR 70 | Ht 73.0 in | Wt 190.4 lb

## 2021-06-28 DIAGNOSIS — I480 Paroxysmal atrial fibrillation: Secondary | ICD-10-CM | POA: Diagnosis not present

## 2021-06-28 DIAGNOSIS — I25119 Atherosclerotic heart disease of native coronary artery with unspecified angina pectoris: Secondary | ICD-10-CM | POA: Diagnosis not present

## 2021-06-28 NOTE — Patient Instructions (Addendum)

## 2021-06-28 NOTE — Progress Notes (Signed)
Cardiology Office Note  Date: 06/28/2021   ID: Kenneth Paterson., DOB Oct 16, 1945, MRN [id number]  PCP:  Lemmie Evens, MD  Cardiologist:  Rozann Lesches, MD Electrophysiologist:  None   Chief Complaint  Patient presents with   Cardiac follow-up    History of Present Illness: Kenneth Paci. is a 75 y.o. male last seen in June.  Interval visit noted in the atrial fibrillation clinic in September, I reviewed the note.  He had experienced an episode of breakthrough atrial fibrillation around that time which spontaneously converted to sinus rhythm.  Since then doing well however.  He does have a Apple Watch for additional monitoring.  He reports no active angina symptoms or change in stamina, no recent nitroglycerin use.  I reviewed the remainder of his medications which are otherwise stable and outlined below.  He does not report any spontaneous bleeding problems on combination of aspirin and Eliquis.  I personally reviewed his ECG today which shows sinus rhythm with prolonged PR interval.  Past Medical History:  Diagnosis Date   BPH (benign prostatic hyperplasia)    CAD (coronary artery disease)    a. s/p DES to OM1 and DES to RCA in 02/2020   CKD (chronic kidney disease) stage 3, GFR 30-59 ml/min (HCC)    Hyperlipidemia    Hypertension    Persistent atrial fibrillation ([place])    Pinched nerve in neck    Type 2 diabetes mellitus ([place])     Past Surgical History:  Procedure Laterality Date   COLONOSCOPY WITH PROPOFOL N/A 04/21/2021   Procedure: COLONOSCOPY WITH PROPOFOL;  Surgeon: Harvel Quale, MD;  [facility];  Service: Gastroenterology;  Laterality: N/A;  7:30   CORONARY STENT INTERVENTION N/A 02/24/2020   Procedure: CORONARY STENT INTERVENTION;  Surgeon: Troy Sine, MD;  Location: [facility];  Service: Cardiovascular;  Laterality: N/A;   EYE SURGERY     LEFT HEART CATH AND CORONARY ANGIOGRAPHY N/A 02/23/2020   Procedure: LEFT HEART CATH  AND CORONARY ANGIOGRAPHY;  Surgeon: Troy Sine, MD;  Location: [facility];  Service: Cardiovascular;  Laterality: N/A;   TONSILLECTOMY     VASECTOMY      Current Outpatient Medications  Medication Sig Dispense Refill   Accu-Chek Softclix Lancets lancets 1 each by Other route as needed.     acetaminophen (TYLENOL) 650 MG CR tablet Take 650 mg by mouth every 8 (eight) hours as needed for pain.     apixaban (ELIQUIS) 5 MG TABS tablet Take 5 mg by mouth 2 (two) times daily.     aspirin EC 81 MG tablet Take 1 tablet (81 mg total) by mouth daily. Swallow whole. Start when you stop plavix 90 tablet 3   atorvastatin (LIPITOR) 80 MG tablet Take 1 tablet (80 mg total) by mouth daily. 90 tablet 3   B-D 3CC LUER-LOK SYR 22GX1" 22G X 1" 3 ML MISC 1 each by Other route as needed.     BD DISP NEEDLES 18G X 1-1/2" MISC 1 each by Other route as needed.     benazepril (LOTENSIN) 20 MG tablet Take 20 mg by mouth daily.     chlorthalidone (HYGROTON) 25 MG tablet Take 25 mg by mouth daily.     clotrimazole-betamethasone (LOTRISONE) cream Apply 1 application topically daily as needed (Rash).      Coenzyme Q10 (CO Q 10 PO) Take 1 capsule by mouth daily.     CONTOUR NEXT TEST test strip  1 each by Other route daily.     famotidine (PEPCID) 10 MG tablet Take 10 mg by mouth daily as needed for heartburn or indigestion.     methocarbamol (ROBAXIN) 500 MG tablet Take 500 mg by mouth daily as needed for muscle spasms.      Misc Natural Products (MENS PROSTATE HEALTH FORMULA PO) Take 1 tablet by mouth in the morning and at bedtime. Prostate Formula: Real Health     Misc Natural Products (OSTEO BI-FLEX TRIPLE STRENGTH PO) Take 1 tablet by mouth in the morning and at bedtime.     mometasone (NASONEX) 50 MCG/ACT nasal spray Place 2 sprays into the nose daily as needed (allergies).     Multiple Vitamins-Minerals (MULTIVITAMIN WITH MINERALS) tablet Take 1 tablet by mouth daily. Men's     nitroGLYCERIN (NITROSTAT)  0.4 MG SL tablet Place 1 tablet (0.4 mg total) under the tongue every 5 (five) minutes as needed for chest pain. 25 tablet 1   tadalafil (CIALIS) 10 MG tablet Take 1 tablet by mouth as needed for erectile dysfunction.     testosterone cypionate (DEPOTESTOSTERONE CYPIONATE) 200 MG/ML injection Inject 200 mg into the muscle every 14 (fourteen) days.      verapamil (CALAN-SR) 120 MG CR tablet TAKE 1 TABLET (120 MG TOTAL) BY MOUTH AT BEDTIME. 90 tablet 3   No current facility-administered medications for this visit.   Allergies:  Codeine   ROS: No orthopnea or PND.  No syncope.  Physical Exam: VS:  BP 132/62   Pulse 70   Ht 6\' 1"  (1.854 m)   Wt 190 lb 6.4 oz (86.4 kg)   SpO2 98%   BMI 25.12 kg/m , BMI Body mass index is 25.12 kg/m.  Wt Readings from Last 3 Encounters:  06/28/21 190 lb 6.4 oz (86.4 kg)  05/23/21 190 lb 6.4 oz (86.4 kg)  04/19/21 188 lb (85.3 kg)    General: Patient appears comfortable at rest. HEENT: Conjunctiva and lids normal, wearing a mask. Neck: Supple, no elevated JVP or carotid bruits, no thyromegaly. Lungs: Clear to auscultation, nonlabored breathing at rest. Cardiac: Regular rate and rhythm, no S3 or significant systolic murmur, no pericardial rub. Extremities: No pitting edema.  ECG:  An ECG dated 05/23/2021 was personally reviewed today and demonstrated:  Sinus rhythm with prolonged PR interval.  Recent Labwork: 03/29/2021: BUN 27; Creatinine, Ser 1.48; Hemoglobin 15.2; Platelets 191; Potassium 4.2; Sodium 133     Component Value Date/Time   CHOL 148 08/17/2020 1030   TRIG 206 (H) 08/17/2020 1030   HDL 37 (L) 08/17/2020 1030   CHOLHDL 4.0 08/17/2020 1030   VLDL 29 02/24/2020 0411   LDLCALC 81 08/17/2020 1030    Other Studies Reviewed Today:  Cardiac catheterization 02/23/2020: Prox RCA lesion is 90% stenosed. Lat 3rd Mrg lesion is 70% stenosed. 1st Mrg lesion is 85% stenosed. Mid LAD lesion is 30% stenosed.   Multivessel coronary productive  disease with 30% LAD stenosis between the first and second diagonal vessel; large codominant left circumflex coronary artery with 85% stenosis in a large proximal OM1 vessel, 20% AV groove circumflex stenosis with 70% distal stenosis in the OM 2 vessel; codominant RCA with 90% focal stenosis in the region of the RV marginal branch takeoff.   Normal LV function with EF estimate at 55%.  LVEDP 13 mmHg.   RECOMMENDATION: The patient will be brought back to the laboratory tomorrow for two-vessel PCI via the femoral approach due to significant angle takeoff with difficulty  and catheter kinking via the radial approach.  The patient was loaded with Plavix today.  He will start on 75 mg of Plavix in the morning.  We will hydrate.  Will change simvastatin to atorvastatin 80 mg for more potency statin therapy. Plan two-vessel PCI tomorrow.   PCI 02/24/2020: Mid LAD lesion is 30% stenosed. Lat 3rd Mrg lesion is 70% stenosed. 1st Mrg lesion is 85% stenosed. Prox RCA lesion is 90% stenosed. Post intervention, there is a 0% residual stenosis. Post intervention, there is a 0% residual stenosis. A stent was successfully placed. A stent was successfully placed.   Successful two-vessel coronary intervention with insertion of a 2.5 x 15 mm Resolute Onyx DES stent postdilated to 2.75 mm in the OM1 vessel of the left circumflex coronary artery with the 85% stenosis being reduced to 0%; and the 90% mid RCA stenosis treated with a 2.0 x 15 mm Resolute stent postdilated to 2.25 mm with the 90% stenosis being reduced to 0%.   RECOMMENDATION: The patient will be hydrated overnight.  Reinstitute Eliquis 5 mg twice a day tomorrow with his history of PAF.  Triple drug therapy with aspirin 81 mg/Plavix 75 mg daily and Eliquis for 1 month; then DC aspirin with Plavix/Eliquis for 6 months probably then can DC Plavix and possibly resume aspirin 81 mg if no contraindication.  Medical therapy for mild concomitant CAD. Aggressive  lipid-lowering therapy with LDL cholesterol 60 or below. Consider possible sleep evaluation with the patient's PAF history.  Assessment and Plan:  1.  Paroxysmal atrial fibrillation with CHA2DS2-VASc score of 4.  He did have an episode of more prolonged breakthrough atrial fibrillation back in September however has been stable since that time on current regimen.  ECG is normal today.  Plan to continue Eliquis along with Calan SR.  Antiarrhythmic therapy can be considered with referral back to the atrial fibrillation clinic if he manifests more progressive breakthrough episodes.  2.  CAD status post DES to the OM1 and DES to the mid RCA in June 2021.  He remains on low-dose aspirin along with Eliquis.  No active angina symptoms.  Otherwise continue Lipitor and Lotensin.  Medication Adjustments/Labs and Tests Ordered: Current medicines are reviewed at length with the patient today.  Concerns regarding medicines are outlined above.   Tests Ordered: Orders Placed This Encounter  Procedures   EKG 12-Lead    Medication Changes: No orders of the defined types were placed in this encounter.   Disposition:  Follow up  6 months.  Signed, Satira Sark, MD, [facility] 06/28/2021 11:06 AM    [facility] [place], [place], [place] [address] Phone: [phone]; Fax: [phone]

## 2021-08-23 ENCOUNTER — Ambulatory Visit: Payer: Medicare Other | Admitting: Cardiology

## 2021-12-01 ENCOUNTER — Telehealth: Payer: Self-pay | Admitting: *Deleted

## 2021-12-01 ENCOUNTER — Other Ambulatory Visit: Payer: Self-pay

## 2021-12-01 ENCOUNTER — Encounter: Payer: Self-pay | Admitting: General Surgery

## 2021-12-01 ENCOUNTER — Ambulatory Visit (INDEPENDENT_AMBULATORY_CARE_PROVIDER_SITE_OTHER): Payer: Medicare Other | Admitting: General Surgery

## 2021-12-01 ENCOUNTER — Encounter: Payer: Self-pay | Admitting: *Deleted

## 2021-12-01 VITALS — BP 166/79 | HR 84 | Temp 97.5°F | Resp 18 | Ht 73.0 in | Wt 188.0 lb

## 2021-12-01 DIAGNOSIS — K4090 Unilateral inguinal hernia, without obstruction or gangrene, not specified as recurrent: Secondary | ICD-10-CM

## 2021-12-01 NOTE — Telephone Encounter (Signed)
? ?  Pre-operative Risk Assessment  ?  ?Patient Name: Kenneth Watkins.  ?DOB: 12/07/1945 ?MRN: [id number]  ? ?  ? ?Request for Surgical Clearance   ? ?Procedure:   INGUINAL HERNIA REPAIR ? ?Date of Surgery:  Clearance TBD  SOMETIME IN MAY 2023                      ?   ?Surgeon:  DR. MARK JENKINS ?Surgeon's Group or Practice Name:  [facility] ?Phone number:  [phone] ?Fax number:  [phone] ?  ?Type of Clearance Requested:   ?- Medical  ?- Pharmacy:  Hold Apixaban (Eliquis)   ?  ?Type of Anesthesia:   CHOICE ?  ?Additional requests/questions:   ? ?Signed, ?Julaine Hua   ?12/01/2021, 5:56 PM  ? ?

## 2021-12-01 NOTE — Progress Notes (Signed)
Kenneth Watkins.; [id number]; [birth year] ? ? ?HPI ?Patient is a 76 year old white male who was referred to my care by Lemmie Evens, MD for evaluation and treatment of inguinal hernias and a possible ventral hernia.  Patient states he has had known inguinal hernias for many years.  This was initially diagnosed by a urologist.  Over the past 6 months or so, he has had worsening pain along the right inguinal region.  He has noticed a lump.  Is starting to affect his daily activity.  He does have a significant coronary artery disease history and is on Eliquis for paroxysmal atrial fibrillation.  He currently is in normal sinus rhythm.  He last saw his cardiologist in October 2022.  He denies any nausea or vomiting. ?Past Medical History:  ?Diagnosis Date  ? BPH (benign prostatic hyperplasia)   ? CAD (coronary artery disease)   ? a. s/p DES to OM1 and DES to RCA in 02/2020  ? CKD (chronic kidney disease) stage 3, GFR 30-59 ml/min (HCC)   ? Hyperlipidemia   ? Hypertension   ? Persistent atrial fibrillation ([place])   ? Pinched nerve in neck   ? Type 2 diabetes mellitus ([place])   ? ? ?Past Surgical History:  ?Procedure Laterality Date  ? COLONOSCOPY WITH PROPOFOL N/A 04/21/2021  ? Procedure: COLONOSCOPY WITH PROPOFOL;  Surgeon: Harvel Quale, MD;  [facility];  Service: Gastroenterology;  Laterality: N/A;  7:30  ? CORONARY STENT INTERVENTION N/A 02/24/2020  ? Procedure: CORONARY STENT INTERVENTION;  Surgeon: Troy Sine, MD;  Location: [facility];  Service: Cardiovascular;  Laterality: N/A;  ? EYE SURGERY    ? LEFT HEART CATH AND CORONARY ANGIOGRAPHY N/A 02/23/2020  ? Procedure: LEFT HEART CATH AND CORONARY ANGIOGRAPHY;  Surgeon: Troy Sine, MD;  Location: [facility];  Service: Cardiovascular;  Laterality: N/A;  ? TONSILLECTOMY    ? VASECTOMY    ? ? ?Family History  ?Problem Relation Age of Onset  ? Uterine cancer Mother   ? Diabetes Mother   ? Stroke Mother   ? Heart attack Father   ?  Stroke Father   ? ? ?Current Outpatient Medications on File Prior to Visit  ?Medication Sig Dispense Refill  ? Accu-Chek Softclix Lancets lancets 1 each by Other route as needed.    ? acetaminophen (TYLENOL) 650 MG CR tablet Take 650 mg by mouth every 8 (eight) hours as needed for pain.    ? apixaban (ELIQUIS) 5 MG TABS tablet Take 5 mg by mouth 2 (two) times daily.    ? aspirin EC 81 MG tablet Take 1 tablet (81 mg total) by mouth daily. Swallow whole. Start when you stop plavix 90 tablet 3  ? atorvastatin (LIPITOR) 80 MG tablet Take 1 tablet (80 mg total) by mouth daily. 90 tablet 3  ? B-D 3CC LUER-LOK SYR 22GX1" 22G X 1" 3 ML MISC 1 each by Other route as needed.    ? BD DISP NEEDLES 18G X 1-1/2" MISC 1 each by Other route as needed.    ? benazepril (LOTENSIN) 20 MG tablet Take 20 mg by mouth daily.    ? chlorthalidone (HYGROTON) 25 MG tablet Take 25 mg by mouth daily.    ? clotrimazole-betamethasone (LOTRISONE) cream Apply 1 application topically daily as needed (Rash).     ? Coenzyme Q10 (CO Q 10 PO) Take 1 capsule by mouth daily.    ? CONTOUR NEXT TEST test strip 1 each  by Other route daily.    ? famotidine (PEPCID) 10 MG tablet Take 10 mg by mouth daily as needed for heartburn or indigestion.    ? FARXIGA 5 MG TABS tablet Take 5 mg by mouth every morning.    ? methocarbamol (ROBAXIN) 500 MG tablet Take 500 mg by mouth daily as needed for muscle spasms.     ? Misc Natural Products (MENS PROSTATE HEALTH FORMULA PO) Take 1 tablet by mouth in the morning and at bedtime. Prostate Formula: Real Health    ? Misc Natural Products (OSTEO BI-FLEX TRIPLE STRENGTH PO) Take 1 tablet by mouth in the morning and at bedtime.    ? mometasone (NASONEX) 50 MCG/ACT nasal spray Place 2 sprays into the nose daily as needed (allergies).    ? Multiple Vitamins-Minerals (MULTIVITAMIN WITH MINERALS) tablet Take 1 tablet by mouth daily. Men's    ? nitroGLYCERIN (NITROSTAT) 0.4 MG SL tablet Place 1 tablet (0.4 mg total) under the tongue  every 5 (five) minutes as needed for chest pain. 25 tablet 1  ? sulfamethoxazole-trimethoprim (BACTRIM DS) 800-160 MG tablet Take 1 tablet by mouth 2 (two) times daily.    ? tadalafil (CIALIS) 10 MG tablet Take 1 tablet by mouth as needed for erectile dysfunction.    ? testosterone cypionate (DEPOTESTOSTERONE CYPIONATE) 200 MG/ML injection Inject 200 mg into the muscle every 14 (fourteen) days.     ? verapamil (CALAN-SR) 120 MG CR tablet TAKE 1 TABLET (120 MG TOTAL) BY MOUTH AT BEDTIME. 90 tablet 3  ? ?No current facility-administered medications on file prior to visit.  ? ? ?Allergies  ?Allergen Reactions  ? Codeine Nausea And Vomiting  ? ? ?Social History  ? ?Substance and Sexual Activity  ?Alcohol Use No  ? ? ?Social History  ? ?Tobacco Use  ?Smoking Status Never  ?Smokeless Tobacco Never  ? ? ?Review of Systems  ?Constitutional: Negative.   ?HENT: Negative.    ?Eyes: Negative.   ?Respiratory: Negative.    ?Cardiovascular: Negative.   ?Gastrointestinal:  Positive for heartburn.  ?Genitourinary:  Positive for frequency.  ?Skin: Negative.   ?Neurological: Negative.   ?Endo/Heme/Allergies:  Bruises/bleeds easily.  ?Psychiatric/Behavioral: Negative.    ? ?Objective  ? ?Vitals:  ? 12/01/21 1125  ?BP: (!) 166/79  ?Pulse: 84  ?Resp: 18  ?Temp: (!) 97.5 ?F (36.4 ?C)  ?SpO2: 99%  ? ? ?Physical Exam ?Vitals reviewed.  ?Constitutional:   ?   Appearance: Normal appearance. He is normal weight. He is not ill-appearing.  ?HENT:  ?   Head: Normocephalic and atraumatic.  ?Cardiovascular:  ?   Rate and Rhythm: Normal rate and regular rhythm.  ?   Heart sounds: Normal heart sounds. No murmur heard. ?  No friction rub. No gallop.  ?Pulmonary:  ?   Effort: Pulmonary effort is normal. No respiratory distress.  ?   Breath sounds: Normal breath sounds. No stridor. No wheezing, rhonchi or rales.  ?Abdominal:  ?   General: Abdomen is flat. Bowel sounds are normal. There is no distension.  ?   Palpations: Abdomen is soft. There is no  mass.  ?   Tenderness: There is no abdominal tenderness. There is no guarding or rebound.  ?   Hernia: A hernia is present.  ?   Comments: Patient has an easily reducible right inguinal hernia.  A weak left inguinal floor is noted.  Diastases recti is present.  ?Genitourinary: ?   Testes: Normal.  ?Skin: ?   General: Skin  is warm and dry.  ?Neurological:  ?   Mental Status: He is alert and oriented to person, place, and time.  ? ?Primary care notes reviewed.  Previous cardiology note reviewed. ?Assessment  ?Right inguinal hernia, symptomatic ?Left inguinal hernia, asymptomatic ?Diastases recti ?Chronic anticoagulation for paroxysmal atrial fibrillation ?Plan  ?Patient would like to delay his right inguinal herniorrhaphy with mesh until May.  That is fine with me.  There is no reason to fix the left inguinal hernia at this time.  We will get cardiology clearance for the surgery.  He does realize he will need to stop his Eliquis 2 days before the procedure.  He will call to schedule the surgery. ?

## 2021-12-04 ENCOUNTER — Other Ambulatory Visit: Payer: Self-pay

## 2021-12-04 ENCOUNTER — Ambulatory Visit (INDEPENDENT_AMBULATORY_CARE_PROVIDER_SITE_OTHER): Payer: Medicare Other | Admitting: Urology

## 2021-12-04 ENCOUNTER — Encounter: Payer: Self-pay | Admitting: Urology

## 2021-12-04 VITALS — BP 169/72 | HR 82

## 2021-12-04 DIAGNOSIS — R350 Frequency of micturition: Secondary | ICD-10-CM | POA: Diagnosis not present

## 2021-12-04 DIAGNOSIS — N410 Acute prostatitis: Secondary | ICD-10-CM | POA: Diagnosis not present

## 2021-12-04 DIAGNOSIS — N401 Enlarged prostate with lower urinary tract symptoms: Secondary | ICD-10-CM

## 2021-12-04 DIAGNOSIS — N138 Other obstructive and reflux uropathy: Secondary | ICD-10-CM

## 2021-12-04 DIAGNOSIS — N5201 Erectile dysfunction due to arterial insufficiency: Secondary | ICD-10-CM | POA: Diagnosis not present

## 2021-12-04 NOTE — Progress Notes (Signed)
? ?12/04/2021 ?3:55 PM  ? ?Kenneth Watkins. ?09-01-1946 ?[id number] ? ?Referring provider: Carlis Abbott, NP ?[address] ?[place],  [place] [address] ? ?Urinary frequency ? ? ?HPI: ?Kenneth Watkins is a 76yo here for evaluation of urinary frequency, prostatitis and erectile dysfunction. He felt a pelvic pressure and pain on DRE. He was having perineal pain on the right. He has had 3 prior episodes in the past. He was started on bactrim DS and took it for 3 days and his symptoms improved. IPSS 4 QOl 3. Urine stream strong. No straining to urinate. He does have worsening urinary urgency and frequency since starting farxiga.  No dysuria or gross hematuria.  ?He has issues maintaining an erection. He has a hx of 2 cardiac stents placed last year. He is on TRT and injects '200mg'$  every 14 days. He uses tadalafil '10mg'$  prn. He also uses a band during intercourse.  ? ? ?PMH: ?Past Medical History:  ?Diagnosis Date  ? BPH (benign prostatic hyperplasia)   ? CAD (coronary artery disease)   ? a. s/p DES to OM1 and DES to RCA in 02/2020  ? CKD (chronic kidney disease) stage 3, GFR 30-59 ml/min (HCC)   ? Hyperlipidemia   ? Hypertension   ? Persistent atrial fibrillation ([facility])   ? Pinched nerve in neck   ? Type 2 diabetes mellitus ([place])   ? ? ?Surgical History: ?Past Surgical History:  ?Procedure Laterality Date  ? COLONOSCOPY WITH PROPOFOL N/A 04/21/2021  ? Procedure: COLONOSCOPY WITH PROPOFOL;  Surgeon: Harvel Quale, MD;  [facility];  Service: Gastroenterology;  Laterality: N/A;  7:30  ? CORONARY STENT INTERVENTION N/A 02/24/2020  ? Procedure: CORONARY STENT INTERVENTION;  Surgeon: Troy Sine, MD;  Location: [facility];  Service: Cardiovascular;  Laterality: N/A;  ? EYE SURGERY    ? LEFT HEART CATH AND CORONARY ANGIOGRAPHY N/A 02/23/2020  ? Procedure: LEFT HEART CATH AND CORONARY ANGIOGRAPHY;  Surgeon: Troy Sine, MD;  Location: [facility];  Service: Cardiovascular;  Laterality: N/A;  ?  TONSILLECTOMY    ? VASECTOMY    ? ? ?Home Medications:  ?Allergies as of 12/04/2021   ? ?   Reactions  ? Codeine Nausea And Vomiting  ? ?  ? ?  ?Medication List  ?  ? ?  ? Accurate as of December 04, 2021  3:55 PM. If you have any questions, ask your nurse or doctor.  ?  ?  ? ?  ? ?Accu-Chek Softclix Lancets lancets ?1 each by Other route as needed. ?  ?acetaminophen 650 MG CR tablet ?Commonly known as: TYLENOL ?Take 650 mg by mouth every 8 (eight) hours as needed for pain. ?  ?apixaban 5 MG Tabs tablet ?Commonly known as: ELIQUIS ?Take 5 mg by mouth 2 (two) times daily. ?  ?aspirin EC 81 MG tablet ?Take 1 tablet (81 mg total) by mouth daily. Swallow whole. Start when you stop plavix ?  ?atorvastatin 80 MG tablet ?Commonly known as: LIPITOR ?Take 1 tablet (80 mg total) by mouth daily. ?  ?B-D 3CC LUER-LOK SYR 22GX1" 22G X 1" 3 ML Misc ?Generic drug: SYRINGE-NEEDLE (DISP) 3 ML ?1 each by Other route as needed. ?  ?BD Disp Needles 18G X 1-1/2" Misc ?Generic drug: NEEDLE (DISP) 18 G ?1 each by Other route as needed. ?  ?benazepril 20 MG tablet ?Commonly known as: LOTENSIN ?Take 20 mg by mouth daily. ?  ?chlorthalidone 25 MG tablet ?Commonly  known as: HYGROTON ?Take 25 mg by mouth daily. ?  ?clotrimazole-betamethasone cream ?Commonly known as: LOTRISONE ?Apply 1 application topically daily as needed (Rash). ?  ?CO Q 10 PO ?Take 1 capsule by mouth daily. ?  ?Contour Next Test test strip ?Generic drug: glucose blood ?1 each by Other route daily. ?  ?famotidine 10 MG tablet ?Commonly known as: PEPCID ?Take 10 mg by mouth daily as needed for heartburn or indigestion. ?  ?Farxiga 5 MG Tabs tablet ?Generic drug: dapagliflozin propanediol ?Take 5 mg by mouth every morning. ?  ?MENS PROSTATE HEALTH FORMULA PO ?Take 1 tablet by mouth in the morning and at bedtime. Prostate Formula: Real Health ?  ?OSTEO BI-FLEX TRIPLE STRENGTH PO ?Take 1 tablet by mouth in the morning and at bedtime. ?  ?methocarbamol 500 MG tablet ?Commonly known  as: ROBAXIN ?Take 500 mg by mouth daily as needed for muscle spasms. ?  ?mometasone 50 MCG/ACT nasal spray ?Commonly known as: NASONEX ?Place 2 sprays into the nose daily as needed (allergies). ?  ?multivitamin with minerals tablet ?Take 1 tablet by mouth daily. Men's ?  ?nitroGLYCERIN 0.4 MG SL tablet ?Commonly known as: Nitrostat ?Place 1 tablet (0.4 mg total) under the tongue every 5 (five) minutes as needed for chest pain. ?  ?sulfamethoxazole-trimethoprim 800-160 MG tablet ?Commonly known as: BACTRIM DS ?Take 1 tablet by mouth 2 (two) times daily. ?  ?tadalafil 10 MG tablet ?Commonly known as: CIALIS ?Take 1 tablet by mouth as needed for erectile dysfunction. ?  ?testosterone cypionate 200 MG/ML injection ?Commonly known as: DEPOTESTOSTERONE CYPIONATE ?Inject 200 mg into the muscle every 14 (fourteen) days. ?  ?verapamil 120 MG CR tablet ?Commonly known as: CALAN-SR ?TAKE 1 TABLET (120 MG TOTAL) BY MOUTH AT BEDTIME. ?  ? ?  ? ? ?Allergies:  ?Allergies  ?Allergen Reactions  ? Codeine Nausea And Vomiting  ? ? ?Family History: ?Family History  ?Problem Relation Age of Onset  ? Uterine cancer Mother   ? Diabetes Mother   ? Stroke Mother   ? Heart attack Father   ? Stroke Father   ? ? ?Social History:  reports that he has never smoked. He has never used smokeless tobacco. He reports that he does not drink alcohol and does not use drugs. ? ?ROS: ?All other review of systems were reviewed and are negative except what is noted above in HPI ? ?Physical Exam: ?BP (!) 169/72   Pulse 82   ?Constitutional:  Alert and oriented, No acute distress. ?HEENT:  AT, moist mucus membranes.  Trachea midline, no masses. ?Cardiovascular: No clubbing, cyanosis, or edema. ?Respiratory: Normal respiratory effort, no increased work of breathing. ?GI: Abdomen is soft, nontender, nondistended, no abdominal masses ?GU: No CVA tenderness. Circumcised phallus. No masses/lesions on penis, testis, scrotum. Prostate 40g with prominent right  lobe smooth no nodules no induration.  ?Lymph: No cervical or inguinal lymphadenopathy. ?Skin: No rashes, bruises or suspicious lesions. ?Neurologic: Grossly intact, no focal deficits, moving all 4 extremities. ?Psychiatric: Normal mood and affect. ? ?Laboratory Data: ?Lab Results  ?Component Value Date  ? WBC 6.2 03/29/2021  ? HGB 15.2 03/29/2021  ? HCT 44.6 03/29/2021  ? MCV 89.6 03/29/2021  ? PLT 191 03/29/2021  ? ? ?Lab Results  ?Component Value Date  ? CREATININE 1.48 (H) 03/29/2021  ? ? ?No results found for: PSA ? ?No results found for: TESTOSTERONE ? ?No results found for: HGBA1C ? ?Urinalysis ?No results found for: COLORURINE, APPEARANCEUR, [place], [place], [place], [place],  BILIRUBINUR, KETONESUR, PROTEINUR, UROBILINOGEN, NITRITE, LEUKOCYTESUR ? ?No results found for: LABMICR, [place], RBCUA, LABEPIT, MUCUS, BACTERIA ? ?Pertinent Imaging: ? ?No results found for this or any previous visit. ? ?No results found for this or any previous visit. ? ?No results found for this or any previous visit. ? ?No results found for this or any previous visit. ? ?No results found for this or any previous visit. ? ?No results found for this or any previous visit. ? ?No results found for this or any previous visit. ? ?No results found for this or any previous visit. ? ? ?Assessment & Plan:   ? ?1. Acute prostatitis ?-continue bactrim DS BID for 21 days ?- Urinalysis, Routine w reflex microscopic ? ?2. Benign prostatic hyperplasia with urinary obstruction ?-We discussed the management of his BPH including observation, medical therapy, Rezum, Urolift, TURP and simple prostatectomy. After discussing the options the patient has elected to proceed with observation.  ? ?3. Urinary frequency ?-the urinary frequency is related to farxiga and currently the patient is not bothered by his urinary frequency. We will continue with observation. ? ?4. Erectile dysfunction due to arterial insufficiency ?Continue tadalafil '10mg'$  prn and penile  clamp.  ? ? ?No follow-ups on file. ? ?Nicolette Bang, MD ? ?[facility] [place] ?  ?

## 2021-12-04 NOTE — Patient Instructions (Signed)

## 2021-12-06 NOTE — Telephone Encounter (Signed)
Patient with diagnosis of atrial fibrillation on Eliquis for anticoagulation.   ? ?Procedure: inguinal hernia repair ?Date of procedure: TBD ? ? ?CHA2DS2-VASc Score = 4  ? This indicates a 4.8% annual risk of stroke. ?The patient's score is based upon: ?CHF History: 0 ?HTN History: 1 ?Diabetes History: 1 ?Stroke History: 0 ?Vascular Disease History: 1 ?Age Score: 1 ?Gender Score: 0 ?  ? ?CrCl 53 ?Platelet count 191 ? ?Per office protocol, patient can hold Eliquis for 2 days prior to procedure.   ?Patient will not need bridging with Lovenox (enoxaparin) around procedure. ?. ? ?

## 2021-12-07 ENCOUNTER — Other Ambulatory Visit: Payer: Self-pay | Admitting: Cardiology

## 2021-12-11 ENCOUNTER — Ambulatory Visit (INDEPENDENT_AMBULATORY_CARE_PROVIDER_SITE_OTHER): Payer: Medicare Other | Admitting: Physician Assistant

## 2021-12-11 ENCOUNTER — Telehealth: Payer: Self-pay | Admitting: *Deleted

## 2021-12-11 ENCOUNTER — Encounter: Payer: Self-pay | Admitting: Physician Assistant

## 2021-12-11 VITALS — BP 117/70 | HR 78

## 2021-12-11 DIAGNOSIS — Z01810 Encounter for preprocedural cardiovascular examination: Secondary | ICD-10-CM

## 2021-12-11 NOTE — Telephone Encounter (Signed)
? ? ?  Name: Kenneth Watkins.  ?DOB: [birth year]  ?MRN: [id number] ? ?Primary Cardiologist: Rozann Lesches, MD ? ? ?Preoperative team, please contact this patient and set up a phone call appointment for further preoperative risk assessment. Please obtain consent and complete medication review. Thank you for your help. ? ?Charlie Pitter, PA-C ?12/11/2021, 9:18 AM ?[phone] ?[facility] ?[address] ?[place], [place] [address] ? ? ?

## 2021-12-11 NOTE — Telephone Encounter (Signed)
Pt agreeable to plan of care for tele pre op appt today at 3:40. Med rec and consent are done.  ? ?  ?Patient Consent for Virtual Visit  ? ? ?   ? ?Kenneth Watkins. has provided verbal consent on 12/11/2021 for a virtual visit (video or telephone). ? ? ?CONSENT FOR VIRTUAL VISIT FOR:  Kenneth Watkins.  ?By participating in this virtual visit I agree to the following: ? ?I hereby voluntarily request, consent and authorize [facility] and its employed or contracted physicians, physician assistants, nurse practitioners or other licensed health care professionals (the Practitioner), to provide me with telemedicine health care services (the ?Services") as deemed necessary by the treating Practitioner. I acknowledge and consent to receive the Services by the Practitioner via telemedicine. I understand that the telemedicine visit will involve communicating with the Practitioner through live audiovisual communication technology and the disclosure of certain medical information by electronic transmission. I acknowledge that I have been given the opportunity to request an in-person assessment or other available alternative prior to the telemedicine visit and am voluntarily participating in the telemedicine visit. ? ?I understand that I have the right to withhold or withdraw my consent to the use of telemedicine in the course of my care at any time, without affecting my right to future care or treatment, and that the Practitioner or I may terminate the telemedicine visit at any time. I understand that I have the right to inspect all information obtained and/or recorded in the course of the telemedicine visit and may receive copies of available information for a reasonable fee.  I understand that some of the potential risks of receiving the Services via telemedicine include:  ?Delay or interruption in medical evaluation due to technological equipment failure or disruption; ?Information transmitted may not be sufficient (e.g.  poor resolution of images) to allow for appropriate medical decision making by the Practitioner; and/or  ?In rare instances, security protocols could fail, causing a breach of personal health information. ? ?Furthermore, I acknowledge that it is my responsibility to provide information about my medical history, conditions and care that is complete and accurate to the best of my ability. I acknowledge that Practitioner's advice, recommendations, and/or decision may be based on factors not within their control, such as incomplete or inaccurate data provided by me or distortions of diagnostic images or specimens that may result from electronic transmissions. I understand that the practice of medicine is not an exact science and that Practitioner makes no warranties or guarantees regarding treatment outcomes. I acknowledge that a copy of this consent can be made available to me via my patient portal ([place]), or I can request a printed copy by calling the office of [facility].   ? ?I understand that my insurance will be billed for this visit.  ? ?I have read or had this consent read to me. ?I understand the contents of this consent, which adequately explains the benefits and risks of the Services being provided via telemedicine.  ?I have been provided ample opportunity to ask questions regarding this consent and the Services and have had my questions answered to my satisfaction. ?I give my informed consent for the services to be provided through the use of telemedicine in my medical care ? ? ? ?

## 2021-12-11 NOTE — Progress Notes (Addendum)
? ?Virtual Visit via Telephone Note  ? ?This visit type was conducted due to national recommendations for restrictions regarding the COVID-19 Pandemic (e.g. social distancing) in an effort to limit this patient's exposure and mitigate transmission in our community.  Due to his co-morbid illnesses, this patient is at least at moderate risk for complications without adequate follow up.  This format is felt to be most appropriate for this patient at this time.  The patient did not have access to video technology/had technical difficulties with video requiring transitioning to audio format only (telephone).  All issues noted in this document were discussed and addressed.  No physical exam could be performed with this format.  Please refer to the patient's chart for his  consent to telehealth for [facility]. ?Evaluation Performed:  Preoperative cardiovascular risk assessment ? ?This visit type was conducted due to national recommendations for restrictions regarding the COVID-19 Pandemic (e.g. social distancing).  This format is felt to be most appropriate for this patient at this time.  All issues noted in this document were discussed and addressed.  No physical exam was performed (except for noted visual exam findings with Video Visits).  Please refer to the patient's chart (MyChart message for video visits and phone note for telephone visits) for the patient's consent to telehealth for [facility]. ?_____________  ? ?Date:  12/11/2021  ? ?Patient ID:  Kenneth Watkins., DOB September 15, 1946, MRN [id number] ?Patient Location:  ?Home ?Provider location:   ?Office ? ?Primary Care Provider:  Lemmie Evens, MD ?Primary Cardiologist:  Rozann Lesches, MD ? ?Chief Complaint  ?  ?76 y.o. y/o male with a h/o CAD s/p DES to OM1 and DES to RCA in 02/2020, paroxysmal atrial fibrillation, HTN, HLD, DM2, BPH, CKD 3a by labs who is pending inguinal hernia repair, and presents today for telephonic preoperative cardiovascular risk  assessment. ? ?Past Medical History  ?  ?Past Medical History:  ?Diagnosis Date  ? BPH (benign prostatic hyperplasia)   ? CAD (coronary artery disease)   ? a. s/p DES to OM1 and DES to RCA in 02/2020  ? CKD (chronic kidney disease) stage 3, GFR 30-59 ml/min (HCC)   ? Hyperlipidemia   ? Hypertension   ? Persistent atrial fibrillation (Kenneth Watkins)   ? Pinched nerve in neck   ? Type 2 diabetes mellitus ([place])   ? ?Past Surgical History:  ?Procedure Laterality Date  ? COLONOSCOPY WITH PROPOFOL N/A 04/21/2021  ? Procedure: COLONOSCOPY WITH PROPOFOL;  Surgeon: Harvel Quale, MD;  [facility];  Service: Gastroenterology;  Laterality: N/A;  7:30  ? CORONARY STENT INTERVENTION N/A 02/24/2020  ? Procedure: CORONARY STENT INTERVENTION;  Surgeon: Troy Sine, MD;  Location: [place] [facility];  Service: Cardiovascular;  Laterality: N/A;  ? EYE SURGERY    ? LEFT HEART CATH AND CORONARY ANGIOGRAPHY N/A 02/23/2020  ? Procedure: LEFT HEART CATH AND CORONARY ANGIOGRAPHY;  Surgeon: Troy Sine, MD;  Location: [place] [facility];  Service: Cardiovascular;  Laterality: N/A;  ? TONSILLECTOMY    ? VASECTOMY    ? ? ?Allergies ? ?Allergies  ?Allergen Reactions  ? Codeine Nausea And Vomiting  ? ? ?History of Present Illness  ?  ?Kenneth Watkins. is a 76 y.o. male who presents via audio/video conferencing for a telehealth visit today.  Pt was last seen in cardiology clinic on 06/28/21, by Dr. Domenic Watkins.  At that time Kenneth Watkins. was doing well.  He s now pending inguinal hernia  repair and we have been asked to hold his Eliquis.  Since his last visit, he reports he has been doing well. He continues to golf, push mow, and remain very active without any recent anginal symptoms or dyspnea. No recent breakthrough afib since last visit. He does want to clarify on his med list that his NP managing his DM had tried him on Farxiga but he stopped this as he did not feel good with it, and they switched him to [facility] and he's done  great. He reports a h/o white coat type elevation in BP at specialty offices but reports ever since his DM meds were changed his numbers at home have all been great. BP today was 117/70 by his home cuff which he states tends to correlate closely with PCP whenever he's compared the numbers. ? ? ?Home Medications  ?  ?Prior to Admission medications   ?Medication Sig Start Date End Date Taking? Authorizing Provider  ?Accu-Chek Softclix Lancets lancets 1 each by Other route as needed. 12/29/19   [provider]  ?acetaminophen (TYLENOL) 650 MG CR tablet Take 650 mg by mouth every 8 (eight) hours as needed for pain.    [provider]  ?apixaban (ELIQUIS) 5 MG TABS tablet Take 5 mg by mouth 2 (two) times daily.    [provider]  ?aspirin EC 81 MG tablet Take 1 tablet (81 mg total) by mouth daily. Swallow whole. Start when you stop plavix 09/17/20   Satira Sark, MD  ?atorvastatin (LIPITOR) 80 MG tablet Take 1 tablet (80 mg total) by mouth daily. 02/26/20   Furth, Cadence H, PA-C  ?B-D 3CC LUER-LOK SYR 22GX1" 22G X 1" 3 ML MISC 1 each by Other route as needed. 09/14/19   [provider]  ?BD DISP NEEDLES 18G X 1-1/2" MISC 1 each by Other route as needed. 10/04/19   [provider]  ?benazepril (LOTENSIN) 20 MG tablet Take 20 mg by mouth daily.    [provider]  ?chlorthalidone (HYGROTON) 25 MG tablet Take 25 mg by mouth daily.    [provider]  ?clotrimazole-betamethasone (LOTRISONE) cream Apply 1 application topically daily as needed ([place]).  08/17/19   [provider]  ?Coenzyme Q10 (CO Q 10 PO) Take 1 capsule by mouth daily.    [provider]  ?CONTOUR NEXT TEST test strip 1 each by Other route daily. 10/31/19   [provider]  ?famotidine (PEPCID) 10 MG tablet Take 10 mg by mouth daily as needed for heartburn or indigestion.    [provider]  ?FARXIGA 5 MG TABS tablet Take 5 mg by mouth every morning. ?Patient  not taking: Reported on 12/11/2021 11/28/21   [provider]  ?JANUVIA 100 MG tablet Take 100 mg by mouth daily. 12/06/21   [provider]  ?methocarbamol (ROBAXIN) 500 MG tablet Take 500 mg by mouth daily as needed for muscle spasms.  12/09/19   [provider]  ?Misc Natural Products ([place]) Take 1 tablet by mouth in the morning and at bedtime. Prostate Formula: [employer]    [provider]  ?Misc Natural Products (OSTEO BI-FLEX TRIPLE STRENGTH PO) Take 1 tablet by mouth in the morning and at bedtime.    [provider]  ?mometasone (NASONEX) 50 MCG/ACT nasal spray Place 2 sprays into the nose daily as needed (allergies).    [provider]  ?Multiple Vitamins-Minerals (MULTIVITAMIN WITH MINERALS) tablet Take 1 tablet by mouth  daily. Men's    [provider]  ?nitroGLYCERIN (NITROSTAT) 0.4 MG SL tablet Place 1 tablet (0.4 mg total) under the tongue every 5 (five) minutes as needed for chest pain. 02/25/20   Furth, Cadence H, PA-C  ?sulfamethoxazole-trimethoprim (BACTRIM DS) 800-160 MG tablet Take 1 tablet by mouth 2 (two) times daily. 11/28/21   [provider]  ?tadalafil (CIALIS) 10 MG tablet Take 1 tablet by mouth as needed for erectile dysfunction. 03/31/20   [provider]  ?testosterone cypionate (DEPOTESTOSTERONE CYPIONATE) 200 MG/ML injection Inject 200 mg into the muscle every 14 (fourteen) days.     [provider]  ?verapamil (CALAN-SR) 120 MG CR tablet TAKE 1 TABLET BY MOUTH AT BEDTIME. 12/07/21   Satira Sark, MD  ? ? ?Physical Exam  ?  ?Vital Signs:  BP 117/70, pulse 78 ? ?Given telephonic nature of communication, physical exam is limited. ?AAOx3. NAD. Normal affect.  Speech and respirations are unlabored. ? ?Accessory Clinical Findings  ?  ?None ? ?Assessment & Plan  ?  ?1.  Preoperative Cardiovascular Risk Assessment. RCRI 0.9% indicating low CV risk. The patient affirms he has  been doing well without any new cardiac symptoms. Able to achieve well over 4 METS without any chest pain or dyspnea. Therefore, based on ACC/AHA guidelines, the patient would be at acceptable risk for the pla

## 2021-12-11 NOTE — Telephone Encounter (Signed)
Pt agreeable to plan of care for tele pre op appt today @ 3:40. Med rec and consent are done.  ?

## 2021-12-14 ENCOUNTER — Telehealth: Payer: Self-pay | Admitting: *Deleted

## 2021-12-14 NOTE — Telephone Encounter (Signed)
Patient seen in office requesting appointment with Dr. Constance Haw as a second opinion to Dr. Arnoldo Morale in regards to hernia.  ? ?Appointment scheduled.  ? ?

## 2021-12-26 ENCOUNTER — Encounter: Payer: Medicare Other | Attending: Family Medicine | Admitting: Nutrition

## 2021-12-26 ENCOUNTER — Other Ambulatory Visit: Payer: Medicare Other

## 2021-12-26 DIAGNOSIS — N182 Chronic kidney disease, stage 2 (mild): Secondary | ICD-10-CM

## 2021-12-26 DIAGNOSIS — Z713 Dietary counseling and surveillance: Secondary | ICD-10-CM | POA: Diagnosis not present

## 2021-12-26 DIAGNOSIS — E782 Mixed hyperlipidemia: Secondary | ICD-10-CM

## 2021-12-26 DIAGNOSIS — E118 Type 2 diabetes mellitus with unspecified complications: Secondary | ICD-10-CM

## 2021-12-26 DIAGNOSIS — E119 Type 2 diabetes mellitus without complications: Secondary | ICD-10-CM | POA: Diagnosis not present

## 2021-12-26 DIAGNOSIS — I2510 Atherosclerotic heart disease of native coronary artery without angina pectoris: Secondary | ICD-10-CM

## 2021-12-26 DIAGNOSIS — I10 Essential (primary) hypertension: Secondary | ICD-10-CM

## 2021-12-26 NOTE — Progress Notes (Signed)
Medical Nutrition Therapy  ?Appointment Start time:  8546  Appointment End time:  2703 ? ?Primary concerns today: Dm Type 2,   ?Referral diagnosis: E11.8 ?Preferred learning style: Visual and read  ?Learning readiness: Ready  ? ? ?NUTRITION ASSESSMENT  ?FBS 90-124's.  ?Had been travelilng a lot and thinks that is the reason for elevated BS. ? ?Anthropometrics  ?Wt Readings from Last 3 Encounters:  ?12/01/21 188 lb (85.3 kg)  ?06/28/21 190 lb 6.4 oz (86.4 kg)  ?05/23/21 190 lb 6.4 oz (86.4 kg)  ? ?Ht Readings from Last 3 Encounters:  ?12/01/21 '6\' 1"'$  (1.854 m)  ?06/28/21 '6\' 1"'$  (1.854 m)  ?05/23/21 '6\' 1"'$  (1.854 m)  ? ?There is no height or weight on file to calculate BMI. ?'@BMIFA'$ @ ?Facility age limit for growth percentiles is 20 years. ?Facility age limit for growth percentiles is 20 years. ?  ? ?Clinical ?Medical Hx: CKD, CAD, Obesity, DM ?Medications: Januvia ?Labs: Notable Signs/Symptoms: fatigue ? ?Lifestyle & Dietary Hx ?Married. He and his wife cook and eat mostly at home. ?DX 2013. ? ?Estimated daily fluid intake: 84 oz ?Supplements: MVI ?Sleep: 8 ?Stress / self-care: None ?Current average weekly physical activity: walks a lot during playing golf. ? ?24-Hr Dietary Recall ?First Meal:  8 am English muffin and yogurt, water ?Snack:  ?Second Meal: 2 pm banana sandwich,  ?Snack:  ?Third Meal:  6 pm grilled chicken, green beans, carrots and corn, water ?Snack: walnuts ?Beverages: water ? ?Estimated Energy Needs ?Calories: 1500 ?Carbohydrate: 170 ?Protein: 112g ?Fat: 42g ? ? ?NUTRITION DIAGNOSIS  ?NB-1.1 Food and nutrition-related knowledge deficit As related to DIabetes Type.  As evidenced by > 6.5%. ? ? ?NUTRITION INTERVENTION  ?Nutrition education (E-1) on the following topics:  ?Nutrition and Diabetes education provided on My Plate, CHO counting, meal planning, portion sizes, timing of meals, avoiding snacks between meals unless having a low blood sugar, target ranges for A1C and blood sugars, signs/symptoms and  treatment of hyper/hypoglycemia, monitoring blood sugars, taking medications as prescribed, benefits of exercising 30 minutes per day and prevention of complications of DM.` ? ? ?Handouts Provided Include  ?Lifestyle Medicine ?Lifestyle nutrition ?Meal Plan Card ? ?Learning Style & Readiness for Change ?Teaching method utilized: Visual & Auditory  ?Demonstrated degree of understanding via: Teach Back  ?Barriers to learning/adherence to lifestyle change: none ? ?Goals Established by Pt ?Goals ? ?Increase exercise to 150 minutes a week or more. ?Don't eat past 7 pm ?Avoid snacks-more veggies if eating  between meals ?Get A1C down to 6.8% ?Increase plant based foods  ? ? ?MONITORING & EVALUATION ?Dietary intake, weekly physical activity, and blood sugars and weight in 1 month. ? ?Next Steps  ?Patient is to work on meal planning, exercise, portion control.. ? ?

## 2021-12-26 NOTE — Patient Instructions (Signed)
Goals ? ?Increase exercise to 150 minutes a week or more. ?Don't eat past 7 pm ?Avoid snacks-more veggies if eating  between meals ?Get A1C down to 6.8% ?Increase plant based foods  ? ?

## 2021-12-28 ENCOUNTER — Other Ambulatory Visit: Payer: Medicare Other

## 2021-12-28 DIAGNOSIS — N401 Enlarged prostate with lower urinary tract symptoms: Secondary | ICD-10-CM

## 2021-12-29 LAB — PSA: Prostate Specific Ag, Serum: 1.5 ng/mL (ref 0.0–4.0)

## 2022-01-02 ENCOUNTER — Encounter: Payer: Self-pay | Admitting: General Surgery

## 2022-01-02 ENCOUNTER — Other Ambulatory Visit: Payer: Self-pay

## 2022-01-02 ENCOUNTER — Ambulatory Visit (INDEPENDENT_AMBULATORY_CARE_PROVIDER_SITE_OTHER): Payer: Medicare Other | Admitting: General Surgery

## 2022-01-02 VITALS — BP 127/69 | HR 67 | Temp 97.8°F | Resp 12 | Ht 73.0 in | Wt 186.0 lb

## 2022-01-02 DIAGNOSIS — K4090 Unilateral inguinal hernia, without obstruction or gangrene, not specified as recurrent: Secondary | ICD-10-CM | POA: Insufficient documentation

## 2022-01-02 DIAGNOSIS — M6208 Separation of muscle (nontraumatic), other site: Secondary | ICD-10-CM | POA: Diagnosis not present

## 2022-01-02 DIAGNOSIS — K4020 Bilateral inguinal hernia, without obstruction or gangrene, not specified as recurrent: Secondary | ICD-10-CM | POA: Diagnosis not present

## 2022-01-02 NOTE — Patient Instructions (Addendum)
Hold Kenneth Watkins starting 01/27/2022. ?Diastasis, post partum videos on You Tube can help show you different exercises like Planks, Glute Jhordyn Hoopingarner to help with diastasis.  ? ?Diastasis Recti ? ?Diastasis recti is a condition in which the muscles of the abdomen (rectus abdominis muscles) become thin and separate. The result is a wider space between the muscles of the right and left abdomen (abdominal muscles). This wider space between the muscles may cause a bulge in the middle of the abdomen. This bulge may be noticed when a person is straining or when he or she sits up after lying down. ?Diastasis recti can affect men and women. It is most common among pregnant women, babies, people with obesity, and people who have had abdominal surgery. Exercise or surgery may help correct this condition. ?What are the causes? ?Common causes of this condition include: ?Pregnancy. As the uterus grows in size, it puts pressure on the abdominal muscles, causing the muscles to separate. ?Obesity. Excess fat puts pressure on abdominal muscles. ?Weight lifting. ?Some exercises of the abdomen. ?Advanced age. ?Genetics. ?Having had surgery on the abdomen before. ?What increases the risk? ?This condition is more likely to develop in: ?Women. ?Newborns, especially newborns who are born early (prematurely). ?What are the signs or symptoms? ?Common symptoms of this condition include: ?A bulge in the middle of your abdomen. You will notice it most when you sit up or strain. ?Pain in your low back, hips, or the area between your hip bones (pelvis). ?Constipation. ?Being unable to control when you urinate (urinary incontinence). ?Bloating. ?Poor posture. ?How is this diagnosed? ?This condition is diagnosed with a physical exam. During the exam, your health care provider will ask you to lie flat on your back and do a crunch or half sit-up. If you have diastasis recti, a bulge will appear lengthwise between your abdominal muscles in the center of your  abdomen. Your health care provider will measure the gap between your muscles with one of the following: ?A medical device used to measure the space between two objects (caliper). ?A tape measure. ?CT scan. ?Ultrasound. ?Finger spaces. Your health care provider will measure the space using his or her fingers. ?How is this treated? ?If your muscle separation is not too large, you may not need treatment. However, if you are a woman who plans to become pregnant again, you should treat this condition before your next pregnancy. Treatment may include: ?Physical therapy exercises to strengthen and tighten your abdominal muscles. ?Lifestyle changes such as weight loss and exercise. ?Over-the-counter pain medicines as needed. ?Rarely Surgery to correct the separation or associated hernia. ?Follow these instructions at home: ?Activity ?Return to your normal activities as told by your health care provider. Ask your health care provider what activities are safe for you. ?Do exercises as told by your health care provider. Make sure you are doing your exercises and movements correctly when lifting weights or doing exercises using your abdominal muscles or the muscles in the center of your body that give stability (core muscles). Proper form can help to prevent this condition from happening again. ?General instructions ?If you are overweight, ask your health care provider for help with weight loss. Losing even a small amount of weight can help to improve your diastasis recti. ?Take over-the-counter or prescription medicines only as told by your health care provider. ?Do not strain. Straining can make the separation worse. Examples of straining include: ?Pushing hard to have a bowel movement, such as when you have constipation. ?Lifting heavy  objects or lifting children. ?Standing up and sitting down. ?You may need to take these actions to prevent or treat constipation: ?Drink enough fluid to keep your urine pale yellow. ?Take  over-the-counter or prescription medicines. ?Eat foods that are high in fiber, such as beans, whole grains, and fresh fruits and vegetables. ?Limit foods that are high in fat and processed sugars, such as fried or sweet foods. ?Keep all follow-up visits. This is important. ?Contact a health care provider if: ?You notice a new bulge in your abdomen. ?Get help right away if: ?You experience severe discomfort in your abdomen. ?You develop severe abdominal pain along with nausea, vomiting, or a fever. ?Summary ?Diastasis recti is a condition in which the muscles of the abdomen (rectus abdominismuscles) become thin and separate. You may notice a bulge in your abdomen because the space has widened between the muscles of the right and left abdomen. ?The most common symptom is a bulge in the middle of your abdomen. You will notice it most when you sit up or strain. ?This condition is diagnosed with a physical exam. ?If the muscle separation is not too big, you may not need treatment. Otherwise, you may need to do physical therapy or have surgery. ?This information is not intended to replace advice given to you by your health care provider. Make sure you discuss any questions you have with your health care provider. ?Document Revised: 05/06/2020 Document Reviewed: 05/06/2020 ?Elsevier Patient Education ? [place]. ?Open Hernia Repair, Adult ?Open hernia repair is a surgical procedure to fix a hernia. A hernia occurs when an internal organ or tissue pushes through a weak spot in the muscles along the wall of the abdomen. Hernias commonly occur in the groin and around the belly button. ?Most hernias tend to get worse over time. Often, surgery is done to prevent the hernia from becoming bigger, uncomfortable, or an emergency. Emergency surgery may be needed if contents of the abdomen get stuck in the opening (incarcerated hernia) or if the blood supply gets cut off (strangulated hernia). In an open repair, an incision  is made in the abdomen to perform the surgery. ?Tell a health care provider about: ?Any allergies you have. ?All medicines you are taking, including vitamins, herbs, eye drops, creams, and over-the-counter medicines. ?Any problems you or family members have had with anesthetic medicines. ?Any blood or bone disorders you have. ?Any surgeries you have had. ?Any medical conditions you have, including any recent cold or flu (influenza)symptoms. ?Whether you are pregnant or may be pregnant. ?What are the risks? ?Generally, this is a safe procedure. However, problems may occur, including: ?Long-lasting (chronic) pain. ?Bleeding. ?Infection. ?Damage to the testicles. This can cause shrinking or swelling. ?Damage to nearby structures or organs, including the bladder, blood vessels, intestines, or nerves near the hernia. ?Blood clots. ?Trouble passing urine. ?Return of the hernia. ?What happens before the procedure? ?Medicines ?Ask your health care provider about: ?Changing or stopping your regular medicines. This is especially important if you are taking diabetes medicines or blood thinners. ?Taking medicines such as aspirin and ibuprofen. These medicines can thin your blood. Do not take these medicines unless your health care provider tells you to take them. ?Taking over-the-counter medicines, vitamins, herbs, and supplements. ?Surgery safety ?Ask your health care provider: ?How your surgery site will be marked. ?What steps will be taken to help prevent infection. These steps may include: ?Removing hair at the surgery site. ?Washing skin with a germ-killing soap. ?Receiving antibiotic medicine. ?General  instructions ?You may have an exam or testing, such as blood tests or imaging studies. ?Do not use any products that contain nicotine or tobacco for at least 4 weeks before the procedure. These products include cigarettes, chewing tobacco, and vaping devices, such as e-cigarettes. If you need help quitting, ask your health  care provider. ?Let your health care provider know if you develop a cold or any infection before your surgery. If you get an infection before surgery, you may receive antibiotics to treat it. ?Plan to hav

## 2022-01-02 NOTE — Progress Notes (Signed)
[facility] History and Physical ? ?Reason for Referral: Second opinion  ? ? ?Chief Complaint   ?Hernia ?  ? ? ?Kenneth Khyre Germond. is a 76 y.o. male.  ?HPI: Kenneth Watkins is a 76 yo who has a history of CAD and A fib on Eliquis, DM, HTN. He has been seen by Dr. 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  He has been having a lump on the right side and worsening pain for the past 6 months. He has not had any issues on the left. He is starting to have issues with his activity given the pain and bulge and wants to get it repaired. He has no obstructive symptoms. He is not always in A fib he reports and goes into it on occasion.  ? ?He returns today to further discuss his hernia repair.  ? ?Past Medical History:  ?Diagnosis Date  ? BPH (benign prostatic hyperplasia)   ? CAD (coronary artery disease)   ? a. s/p DES to OM1 and DES to RCA in 02/2020  ? CKD (chronic kidney disease) stage 3, GFR 30-59 ml/min (HCC)   ? Hyperlipidemia   ? Hypertension   ? Persistent atrial fibrillation ([place])   ? Pinched nerve in neck   ? Type 2 diabetes mellitus ([place])   ? ? ?Past Surgical History:  ?Procedure Laterality Date  ? COLONOSCOPY WITH PROPOFOL N/A 04/21/2021  ? Procedure: COLONOSCOPY WITH PROPOFOL;  Surgeon: Harvel Quale, MD;  [facility];  Service: Gastroenterology;  Laterality: N/A;  7:30  ? CORONARY STENT INTERVENTION N/A 02/24/2020  ? Procedure: CORONARY STENT INTERVENTION;  Surgeon: Troy Sine, MD;  Location: [facility];  Service: Cardiovascular;  Laterality: N/A;  ? EYE SURGERY    ? LEFT HEART CATH AND CORONARY ANGIOGRAPHY N/A 02/23/2020  ? Procedure: LEFT HEART CATH AND CORONARY ANGIOGRAPHY;  Surgeon: Troy Sine, MD;  Location: [facility];  Service: Cardiovascular;  Laterality: N/A;  ? TONSILLECTOMY    ? VASECTOMY    ? ? ?Family History  ?Problem Relation Age of Onset  ? Uterine cancer Mother   ? Diabetes Mother   ? Stroke  Mother   ? Heart attack Father   ? Stroke Father   ? ? ?Social History  ? ?Tobacco Use  ? Smoking status: Never  ? Smokeless tobacco: Never  ?Vaping Use  ? Vaping Use: Never used  ?Substance Use Topics  ? Alcohol use: No  ? Drug use: No  ? ? ?Medications: I have reviewed the patient's current medications. ?Allergies as of 01/02/2022   ? ?   Reactions  ? Codeine Nausea And Vomiting  ? ?  ? ?  ?Medication List  ?  ? ?  ? Accurate as of January 02, 2022 10:03 AM. If you have any questions, ask your nurse or doctor.  ?  ?  ? ?  ? ?Accu-Chek Softclix Lancets lancets ?1 each by Other route as needed. ?  ?acetaminophen 650 MG CR tablet ?Commonly known as: TYLENOL ?Take 650 mg by mouth every 8 (eight) hours as needed for pain. ?  ?apixaban 5 MG Tabs tablet ?Commonly known as: ELIQUIS ?Take 5 mg by mouth 2 (two) times daily. ?  ?aspirin EC 81 MG tablet ?Take 1 tablet (81 mg total) by mouth daily. Swallow whole. Start when you stop plavix ?  ?atorvastatin 80 MG tablet ?Commonly known as: LIPITOR ?Take 1 tablet (80 mg  total) by mouth daily. ?  ?B-D 3CC LUER-LOK SYR 22GX1" 22G X 1" 3 ML Misc ?Generic drug: SYRINGE-NEEDLE (DISP) 3 ML ?1 each by Other route as needed. ?  ?BD Disp Needles 18G X 1-1/2" Misc ?Generic drug: NEEDLE (DISP) 18 G ?1 each by Other route as needed. ?  ?benazepril 20 MG tablet ?Commonly known as: LOTENSIN ?Take 20 mg by mouth daily. ?  ?chlorthalidone 25 MG tablet ?Commonly known as: HYGROTON ?Take 25 mg by mouth daily. ?  ?clotrimazole-betamethasone cream ?Commonly known as: LOTRISONE ?Apply 1 application topically daily as needed (Rash). ?  ?CO Q 10 PO ?Take 1 capsule by mouth daily. ?  ?Contour Next Test test strip ?Generic drug: glucose blood ?1 each by Other route daily. ?  ?famotidine 10 MG tablet ?Commonly known as: PEPCID ?Take 10 mg by mouth daily as needed for heartburn or indigestion. ?  ?Januvia 100 MG tablet ?Generic drug: sitaGLIPtin ?Take 100 mg by mouth daily. ?  ?MENS PROSTATE HEALTH FORMULA  PO ?Take 1 tablet by mouth in the morning and at bedtime. Prostate Formula: Real Health ?  ?OSTEO BI-FLEX TRIPLE STRENGTH PO ?Take 1 tablet by mouth in the morning and at bedtime. ?  ?methocarbamol 500 MG tablet ?Commonly known as: ROBAXIN ?Take 500 mg by mouth daily as needed for muscle spasms. ?  ?mometasone 50 MCG/ACT nasal spray ?Commonly known as: NASONEX ?Place 2 sprays into the nose daily as needed (allergies). ?  ?multivitamin with minerals tablet ?Take 1 tablet by mouth daily. Men's ?  ?nitroGLYCERIN 0.4 MG SL tablet ?Commonly known as: Nitrostat ?Place 1 tablet (0.4 mg total) under the tongue every 5 (five) minutes as needed for chest pain. ?  ?sulfamethoxazole-trimethoprim 800-160 MG tablet ?Commonly known as: BACTRIM DS ?Take 1 tablet by mouth 2 (two) times daily. ?  ?tadalafil 10 MG tablet ?Commonly known as: CIALIS ?Take 1 tablet by mouth as needed for erectile dysfunction. ?  ?testosterone cypionate 200 MG/ML injection ?Commonly known as: DEPOTESTOSTERONE CYPIONATE ?Inject 200 mg into the muscle every 14 (fourteen) days. ?  ?verapamil 120 MG CR tablet ?Commonly known as: CALAN-SR ?TAKE 1 TABLET BY MOUTH AT BEDTIME. ?  ? ?  ? ? ? ?ROS:  ?A comprehensive review of systems was negative except for: Gastrointestinal: positive for abdominal pain and right inguinal hernia repair ? ?Blood pressure 127/69, pulse 67, temperature 97.8 ?F (36.6 ?C), temperature source Oral, resp. rate 12, height '6\' 1"'$  (1.854 m), weight 186 lb (84.4 kg), SpO2 98 %. ?Physical Exam ?Vitals reviewed.  ?Constitutional:   ?   Appearance: Normal appearance.  ?HENT:  ?   Head: Normocephalic.  ?   Mouth/Throat:  ?   Mouth: Mucous membranes are moist.  ?Eyes:  ?   Extraocular Movements: Extraocular movements intact.  ?Cardiovascular:  ?   Rate and Rhythm: Normal rate and regular rhythm.  ?Pulmonary:  ?   Effort: Pulmonary effort is normal.  ?   Breath sounds: Normal breath sounds.  ?Abdominal:  ?   General: There is no distension.  ?    Palpations: Abdomen is soft.  ?   Tenderness: There is no abdominal tenderness.  ?   Hernia: A hernia is present. Hernia is present in the left inguinal area and right inguinal area.  ?   Comments: Diastasis recti 5cm epigastric region, no hernia defect, Very small left inguinal hernia, difficult to appreciate, reducible right   ?Musculoskeletal:     ?   General: Normal range of motion.  ?  Cervical back: Normal range of motion.  ?Skin: ?   General: Skin is warm.  ?Neurological:  ?   General: No focal deficit present.  ?   Mental Status: He is alert and oriented to person, place, and time.  ?Psychiatric:     ?   Mood and Affect: Mood normal.     ?   Behavior: Behavior normal.  ? ? ?Results: None  ? ?Assessment & Plan:  ?Kenneth Watkins. is a 76 y.o. male with bilateral inguinal hernias. The left is completely asymptomatic and the right is larger and causing problems. He has diastasis recti of the epigastric region and says he sees a bulge when sitting up.  ? ?-Discussed the risk and benefits including, bleeding, infection, use of mesh, risk of recurrence, risk of nerve damage causing numbness or changes in sensation, risk of damage to the cord structures. The patient understands the risk and benefits of repair with mesh, and has decided to proceed.  We also discussed open versus laparoscopic surgery and the use of mesh. We discussed that I do open repairs with mesh, and that this is considered equivalent to laparoscopic surgery. We discussed reasons for opting for laparoscopic surgery including if a bilateral repair is needed or if a patient has a recurrence after an open repair. ? ?-He does not want to pursue any bilateral repair with laparoscopic approach. Discussed he still could have symptoms develop in the next 5-10 years that would require repair.  ?-Discussed diastasis recti and exercises for that to help.  ? ? ?All questions were answered to the satisfaction of the patient. ?Hold Eliquis for 2 days prior to  the surgery.  ? ? ?Virl Cagey ?01/02/2022, 10:03 AM  ? ? ? ? ? ?

## 2022-01-03 NOTE — H&P (Signed)
[facility] History and Physical ? ?Reason for Referral: Second opinion  ? ? ?Chief Complaint   ?Hernia ?  ? ? ?Kenneth Stepehn Eckard. is a 76 y.o. male.  ?HPI: Kenneth Watkins is a 76 yo who has a history of CAD and A fib on Eliquis, DM, HTN. He has been seen by Dr. 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  He has been having a lump on the right side and worsening pain for the past 6 months. He has not had any issues on the left. He is starting to have issues with his activity given the pain and bulge and wants to get it repaired. He has no obstructive symptoms. He is not always in A fib he reports and goes into it on occasion.  ? ?He returns today to further discuss his hernia repair.  ? ?Past Medical History:  ?Diagnosis Date  ? BPH (benign prostatic hyperplasia)   ? CAD (coronary artery disease)   ? a. s/p DES to OM1 and DES to RCA in 02/2020  ? CKD (chronic kidney disease) stage 3, GFR 30-59 ml/min (HCC)   ? Hyperlipidemia   ? Hypertension   ? Persistent atrial fibrillation ([place])   ? Pinched nerve in neck   ? Type 2 diabetes mellitus ([place])   ? ? ?Past Surgical History:  ?Procedure Laterality Date  ? COLONOSCOPY WITH PROPOFOL N/A 04/21/2021  ? Procedure: COLONOSCOPY WITH PROPOFOL;  Surgeon: Harvel Quale, MD;  [facility];  Service: Gastroenterology;  Laterality: N/A;  7:30  ? CORONARY STENT INTERVENTION N/A 02/24/2020  ? Procedure: CORONARY STENT INTERVENTION;  Surgeon: Troy Sine, MD;  Location: [facility];  Service: Cardiovascular;  Laterality: N/A;  ? EYE SURGERY    ? LEFT HEART CATH AND CORONARY ANGIOGRAPHY N/A 02/23/2020  ? Procedure: LEFT HEART CATH AND CORONARY ANGIOGRAPHY;  Surgeon: Troy Sine, MD;  Location: [facility];  Service: Cardiovascular;  Laterality: N/A;  ? TONSILLECTOMY    ? VASECTOMY    ? ? ?Family History  ?Problem Relation Age of Onset  ? Uterine cancer Mother   ? Diabetes Mother   ? Stroke  Mother   ? Heart attack Father   ? Stroke Father   ? ? ?Social History  ? ?Tobacco Use  ? Smoking status: Never  ? Smokeless tobacco: Never  ?Vaping Use  ? Vaping Use: Never used  ?Substance Use Topics  ? Alcohol use: No  ? Drug use: No  ? ? ?Medications: I have reviewed the patient's current medications. ?Allergies as of 01/02/2022   ? ?   Reactions  ? Codeine Nausea And Vomiting  ? ?  ? ?  ?Medication List  ?  ? ?  ? Accurate as of January 02, 2022 10:03 AM. If you have any questions, ask your nurse or doctor.  ?  ?  ? ?  ? ?Accu-Chek Softclix Lancets lancets ?1 each by Other route as needed. ?  ?acetaminophen 650 MG CR tablet ?Commonly known as: TYLENOL ?Take 650 mg by mouth every 8 (eight) hours as needed for pain. ?  ?apixaban 5 MG Tabs tablet ?Commonly known as: ELIQUIS ?Take 5 mg by mouth 2 (two) times daily. ?  ?aspirin EC 81 MG tablet ?Take 1 tablet (81 mg total) by mouth daily. Swallow whole. Start when you stop plavix ?  ?atorvastatin 80 MG tablet ?Commonly known as: LIPITOR ?Take 1 tablet (80 mg  total) by mouth daily. ?  ?B-D 3CC LUER-LOK SYR 22GX1" 22G X 1" 3 ML Misc ?Generic drug: SYRINGE-NEEDLE (DISP) 3 ML ?1 each by Other route as needed. ?  ?BD Disp Needles 18G X 1-1/2" Misc ?Generic drug: NEEDLE (DISP) 18 G ?1 each by Other route as needed. ?  ?benazepril 20 MG tablet ?Commonly known as: LOTENSIN ?Take 20 mg by mouth daily. ?  ?chlorthalidone 25 MG tablet ?Commonly known as: HYGROTON ?Take 25 mg by mouth daily. ?  ?clotrimazole-betamethasone cream ?Commonly known as: LOTRISONE ?Apply 1 application topically daily as needed (Rash). ?  ?CO Q 10 PO ?Take 1 capsule by mouth daily. ?  ?Contour Next Test test strip ?Generic drug: glucose blood ?1 each by Other route daily. ?  ?famotidine 10 MG tablet ?Commonly known as: PEPCID ?Take 10 mg by mouth daily as needed for heartburn or indigestion. ?  ?Januvia 100 MG tablet ?Generic drug: sitaGLIPtin ?Take 100 mg by mouth daily. ?  ?MENS PROSTATE HEALTH FORMULA  PO ?Take 1 tablet by mouth in the morning and at bedtime. Prostate Formula: Real Health ?  ?OSTEO BI-FLEX TRIPLE STRENGTH PO ?Take 1 tablet by mouth in the morning and at bedtime. ?  ?methocarbamol 500 MG tablet ?Commonly known as: ROBAXIN ?Take 500 mg by mouth daily as needed for muscle spasms. ?  ?mometasone 50 MCG/ACT nasal spray ?Commonly known as: NASONEX ?Place 2 sprays into the nose daily as needed (allergies). ?  ?multivitamin with minerals tablet ?Take 1 tablet by mouth daily. Men's ?  ?nitroGLYCERIN 0.4 MG SL tablet ?Commonly known as: Nitrostat ?Place 1 tablet (0.4 mg total) under the tongue every 5 (five) minutes as needed for chest pain. ?  ?sulfamethoxazole-trimethoprim 800-160 MG tablet ?Commonly known as: BACTRIM DS ?Take 1 tablet by mouth 2 (two) times daily. ?  ?tadalafil 10 MG tablet ?Commonly known as: CIALIS ?Take 1 tablet by mouth as needed for erectile dysfunction. ?  ?testosterone cypionate 200 MG/ML injection ?Commonly known as: DEPOTESTOSTERONE CYPIONATE ?Inject 200 mg into the muscle every 14 (fourteen) days. ?  ?verapamil 120 MG CR tablet ?Commonly known as: CALAN-SR ?TAKE 1 TABLET BY MOUTH AT BEDTIME. ?  ? ?  ? ? ? ?ROS:  ?A comprehensive review of systems was negative except for: Gastrointestinal: positive for abdominal pain and right inguinal hernia repair ? ?Blood pressure 127/69, pulse 67, temperature 97.8 ?F (36.6 ?C), temperature source Oral, resp. rate 12, height '6\' 1"'$  (1.854 m), weight 186 lb (84.4 kg), SpO2 98 %. ?Physical Exam ?Vitals reviewed.  ?Constitutional:   ?   Appearance: Normal appearance.  ?HENT:  ?   Head: Normocephalic.  ?   Mouth/Throat:  ?   Mouth: Mucous membranes are moist.  ?Eyes:  ?   Extraocular Movements: Extraocular movements intact.  ?Cardiovascular:  ?   Rate and Rhythm: Normal rate and regular rhythm.  ?Pulmonary:  ?   Effort: Pulmonary effort is normal.  ?   Breath sounds: Normal breath sounds.  ?Abdominal:  ?   General: There is no distension.  ?    Palpations: Abdomen is soft.  ?   Tenderness: There is no abdominal tenderness.  ?   Hernia: A hernia is present. Hernia is present in the left inguinal area and right inguinal area.  ?   Comments: Diastasis recti 5cm epigastric region, no hernia defect, Very small left inguinal hernia, difficult to appreciate, reducible right   ?Musculoskeletal:     ?   General: Normal range of motion.  ?  Cervical back: Normal range of motion.  ?Skin: ?   General: Skin is warm.  ?Neurological:  ?   General: No focal deficit present.  ?   Mental Status: He is alert and oriented to person, place, and time.  ?Psychiatric:     ?   Mood and Affect: Mood normal.     ?   Behavior: Behavior normal.  ? ? ?Results: None  ? ?Assessment & Plan:  ?Kenneth Watkins. is a 76 y.o. male with bilateral inguinal hernias. The left is completely asymptomatic and the right is larger and causing problems. He has diastasis recti of the epigastric region and says he sees a bulge when sitting up.  ? ?-Discussed the risk and benefits including, bleeding, infection, use of mesh, risk of recurrence, risk of nerve damage causing numbness or changes in sensation, risk of damage to the cord structures. The patient understands the risk and benefits of repair with mesh, and has decided to proceed.  We also discussed open versus laparoscopic surgery and the use of mesh. We discussed that I do open repairs with mesh, and that this is considered equivalent to laparoscopic surgery. We discussed reasons for opting for laparoscopic surgery including if a bilateral repair is needed or if a patient has a recurrence after an open repair. ? ?-He does not want to pursue any bilateral repair with laparoscopic approach. Discussed he still could have symptoms develop in the next 5-10 years that would require repair.  ?-Discussed diastasis recti and exercises for that to help.  ? ? ?All questions were answered to the satisfaction of the patient. ?Hold Eliquis for 2 days prior to  the surgery.  ? ? ?Kenneth Watkins ?01/02/2022, 10:03 AM  ? ?

## 2022-01-15 ENCOUNTER — Encounter: Payer: Self-pay | Admitting: Nutrition

## 2022-01-24 NOTE — Patient Instructions (Signed)
? ? ? ? ? ? ? ? Mcguire Lum Babe. ? 01/24/2022  ?  ? '@PREFPERIOPPHARMACY'$ @ ? ? Your procedure is scheduled on  01/29/2022. ? ? Report to [facility] at  0600 A.M. ? ? Call this number if you have problems the morning of surgery: ? [phone] ? ? Remember: ? Do not eat or drink after midnight. ?  ? ?  DO NOT take any medications for diabetes the morning of your procedure. ?  ? Take these medicines the morning of surgery with A SIP OF WATER  ? ?                                         pepcid. ?  ? Do not wear jewelry, make-up or nail polish. ? Do not wear lotions, powders, or perfumes, or deodorant. ? Do not shave 48 hours prior to surgery.  Men may shave face and neck. ? Do not bring valuables to the hospital. ? [facility] is not responsible for any belongings or valuables. ? ?Contacts, dentures or bridgework may not be worn into surgery.  Leave your suitcase in the car.  After surgery it may be brought to your room. ? ?For patients admitted to the hospital, discharge time will be determined by your treatment team. ? ?Patients discharged the day of surgery will not be allowed to drive home and must have someone with them for 24 hours.  ? ? ?Special instructions:   DO NOT smoke tobacco or vape for 24 hours before your procedure. ? ?Please read over the following fact sheets that you were given. ?Coughing and Deep Breathing, Surgical Site Infection Prevention, Anesthesia Post-op Instructions, and Care and Recovery After Surgery ?  ? ? ? Open Hernia Repair, Adult, Care After ?What can I expect after the procedure? ?After the procedure, it is common to have: ?Mild discomfort. ?Slight bruising. ?Mild swelling. ?Pain in the belly (abdomen). ?A small amount of blood from the cut from surgery (incision). ?Follow these instructions at home: ?Your doctor may give you more specific instructions. If you have problems, call your doctor. ?Medicines ?Take over-the-counter and prescription medicines only as told by your doctor. ?If  told, take steps to prevent problems with pooping (constipation). You may need to: ?Drink enough fluid to keep your pee (urine) pale yellow. ?Take medicines. You will be told what medicines to take. ?Eat foods that are high in fiber. These include beans, whole grains, and fresh fruits and vegetables. ?Limit foods that are high in fat and sugar. These include fried or sweet foods. ?Ask your doctor if you should avoid driving or using machines while you are taking your medicine. ?Incision care ? ?Follow instructions from your doctor about how to take care of your incision. Make sure you: ?Wash your hands with soap and water for at least 20 seconds before and after you change your bandage (dressing). If you cannot use soap and water, use hand sanitizer. ?Change your bandage. ?Leave stitches or skin glue in place for at least 2 weeks. ?Leave tape strips alone unless you are told to take them off. You may trim the edges of the tape strips if they curl up. ?Check your incision every day for signs of infection. Check for: ?More redness, swelling, or pain. ?More fluid or blood. ?Warmth. ?Pus or a bad smell. ?Wear loose, soft clothing while your incision heals. ?Activity ? ?Rest  as told by your doctor. ?Do not lift anything that is heavier than 10 lb (4.5 kg), or the limit that you are told. ?Do not play contact sports until your doctor says that this is safe. ?If you were given a sedative during your procedure, do not drive or use machines until your doctor says that it is safe. A sedative is a medicine that helps you relax. ?Return to your normal activities when your doctor says that it is safe. ?General instructions ?Do not take baths, swim, or use a hot tub. Ask your doctor about taking showers or sponge baths. ?Hold a pillow over your belly when you cough or sneeze. This helps with pain. ?Do not smoke or use any products that contain nicotine or tobacco. If you need help quitting, ask your doctor. ?Keep all follow-up  visits. ?Contact a doctor if: ?You have any of these signs of infection in or around your incision: ?More redness, swelling, or pain. ?More fluid or blood. ?Warmth. ?Pus. ?A bad smell. ?You have a fever or chills. ?You have blood in your poop (stool). ?You have not pooped (had a bowel movement) in 2-3 days. ?Medicine does not help your pain. ?Get help right away if: ?You have chest pain, or you are short of breath. ?You feel faint or light-headed. ?You have very bad pain. ?You vomit and your pain is worse. ?You have pain, swelling, or redness in a leg. ?These symptoms may be an emergency. Get help right away. Call your local emergency services (911 in the [place] ?Do not wait to see if the symptoms will go away. ?Do not drive yourself to the hospital. ?Summary ?After this procedure, it is common to have mild discomfort, slight bruising, and mild swelling. ?Follow instructions from your doctor about how to take care of your cut from surgery (incision). Check every day for signs of infection. ?Do not lift heavy objects or play contact sports until your doctor says it is safe. ?Return to your normal activities as told by your doctor. ?This information is not intended to replace advice given to you by your health care provider. Make sure you discuss any questions you have with your health care provider. ?Document Revised: 04/18/2020 Document Reviewed: 04/18/2020 ?Elsevier Patient Education ? [place]. ?General Anesthesia, Adult, Care After ?This sheet gives you information about how to care for yourself after your procedure. Your health care provider may also give you more specific instructions. If you have problems or questions, contact your health care provider. ?What can I expect after the procedure? ?After the procedure, the following side effects are common: ?Pain or discomfort at the IV site. ?Nausea. ?Vomiting. ?Sore throat. ?Trouble concentrating. ?Feeling cold or chills. ?Feeling weak or  tired. ?Sleepiness and fatigue. ?Soreness and body aches. These side effects can affect parts of the body that were not involved in surgery. ?Follow these instructions at home: ?For the time period you were told by your health care provider: ? ?Rest. ?Do not participate in activities where you could fall or become injured. ?Do not drive or use machinery. ?Do not drink alcohol. ?Do not take sleeping pills or medicines that cause drowsiness. ?Do not make important decisions or sign legal documents. ?Do not take care of children on your own. ?Eating and drinking ?Follow any instructions from your health care provider about eating or drinking restrictions. ?When you feel hungry, start by eating small amounts of foods that are soft and easy to digest (bland), such as toast. Gradually return to  your regular diet. ?Drink enough fluid to keep your urine pale yellow. ?If you vomit, rehydrate by drinking water, juice, or clear broth. ?General instructions ?If you have sleep apnea, surgery and certain medicines can increase your risk for breathing problems. Follow instructions from your health care provider about wearing your sleep device: ?Anytime you are sleeping, including during daytime naps. ?While taking prescription pain medicines, sleeping medicines, or medicines that make you drowsy. ?Have a responsible adult stay with you for the time you are told. It is important to have someone help care for you until you are awake and alert. ?Return to your normal activities as told by your health care provider. Ask your health care provider what activities are safe for you. ?Take over-the-counter and prescription medicines only as told by your health care provider. ?If you smoke, do not smoke without supervision. ?Keep all follow-up visits as told by your health care provider. This is important. ?Contact a health care provider if: ?You have nausea or vomiting that does not get better with medicine. ?You cannot eat or drink  without vomiting. ?You have pain that does not get better with medicine. ?You are unable to pass urine. ?You develop a skin rash. ?You have a fever. ?You have redness around your IV site that gets worse. ?Get help right away

## 2022-01-26 ENCOUNTER — Encounter (HOSPITAL_COMMUNITY): Payer: Self-pay

## 2022-01-26 ENCOUNTER — Encounter (HOSPITAL_COMMUNITY)
Admission: RE | Admit: 2022-01-26 | Discharge: 2022-01-26 | Disposition: A | Discharge status: Home / Self Care | Payer: Medicare Other | Source: Ambulatory Visit | Attending: General Surgery | Admitting: General Surgery

## 2022-01-26 VITALS — BP 153/73 | HR 66 | Temp 97.8°F | Resp 18 | Ht 73.0 in | Wt 188.0 lb

## 2022-01-26 DIAGNOSIS — Z01812 Encounter for preprocedural laboratory examination: Secondary | ICD-10-CM | POA: Diagnosis not present

## 2022-01-26 DIAGNOSIS — E118 Type 2 diabetes mellitus with unspecified complications: Secondary | ICD-10-CM

## 2022-01-26 DIAGNOSIS — E119 Type 2 diabetes mellitus without complications: Secondary | ICD-10-CM | POA: Insufficient documentation

## 2022-01-26 LAB — HEMOGLOBIN A1C
Hgb A1c MFr Bld: 6.6 % — ABNORMAL HIGH (ref 4.8–5.6)
Mean Plasma Glucose: 142.72 mg/dL

## 2022-01-26 LAB — BASIC METABOLIC PANEL
Anion gap: 7 (ref 5–15)
BUN: 29 mg/dL — ABNORMAL HIGH (ref 8–23)
CO2: 26 mmol/L (ref 22–32)
Calcium: 9.1 mg/dL (ref 8.9–10.3)
Chloride: 103 mmol/L (ref 98–111)
Creatinine, Ser: 1.37 mg/dL — ABNORMAL HIGH (ref 0.61–1.24)
GFR, Estimated: 54 mL/min — ABNORMAL LOW (ref >60)
Glucose, Bld: 135 mg/dL — ABNORMAL HIGH (ref 70–99)
Potassium: 3.5 mmol/L (ref 3.5–5.1)
Sodium: 136 mmol/L (ref 135–145)

## 2022-01-29 ENCOUNTER — Ambulatory Visit (HOSPITAL_BASED_OUTPATIENT_CLINIC_OR_DEPARTMENT_OTHER): Payer: Medicare Other | Admitting: Anesthesiology

## 2022-01-29 ENCOUNTER — Other Ambulatory Visit: Payer: Self-pay

## 2022-01-29 ENCOUNTER — Ambulatory Visit (HOSPITAL_COMMUNITY): Payer: Medicare Other | Admitting: Anesthesiology

## 2022-01-29 ENCOUNTER — Ambulatory Visit (HOSPITAL_COMMUNITY)
Admission: RE | Admit: 2022-01-29 | Discharge: 2022-01-29 | Disposition: A | Discharge status: Home / Self Care | Payer: Medicare Other | Attending: General Surgery | Admitting: General Surgery

## 2022-01-29 ENCOUNTER — Encounter (HOSPITAL_COMMUNITY)
Admission: RE | Disposition: A | Discharge status: Home / Self Care | Payer: Self-pay | Source: Home / Self Care | Attending: General Surgery

## 2022-01-29 DIAGNOSIS — K219 Gastro-esophageal reflux disease without esophagitis: Secondary | ICD-10-CM | POA: Diagnosis not present

## 2022-01-29 DIAGNOSIS — K4020 Bilateral inguinal hernia, without obstruction or gangrene, not specified as recurrent: Secondary | ICD-10-CM | POA: Diagnosis not present

## 2022-01-29 DIAGNOSIS — I2510 Atherosclerotic heart disease of native coronary artery without angina pectoris: Secondary | ICD-10-CM | POA: Insufficient documentation

## 2022-01-29 DIAGNOSIS — I129 Hypertensive chronic kidney disease with stage 1 through stage 4 chronic kidney disease, or unspecified chronic kidney disease: Secondary | ICD-10-CM | POA: Insufficient documentation

## 2022-01-29 DIAGNOSIS — K4090 Unilateral inguinal hernia, without obstruction or gangrene, not specified as recurrent: Secondary | ICD-10-CM | POA: Diagnosis present

## 2022-01-29 DIAGNOSIS — Z7901 Long term (current) use of anticoagulants: Secondary | ICD-10-CM | POA: Insufficient documentation

## 2022-01-29 DIAGNOSIS — E1122 Type 2 diabetes mellitus with diabetic chronic kidney disease: Secondary | ICD-10-CM | POA: Diagnosis not present

## 2022-01-29 DIAGNOSIS — Z7984 Long term (current) use of oral hypoglycemic drugs: Secondary | ICD-10-CM | POA: Diagnosis not present

## 2022-01-29 DIAGNOSIS — N183 Chronic kidney disease, stage 3 unspecified: Secondary | ICD-10-CM | POA: Diagnosis not present

## 2022-01-29 DIAGNOSIS — I10 Essential (primary) hypertension: Secondary | ICD-10-CM

## 2022-01-29 DIAGNOSIS — E119 Type 2 diabetes mellitus without complications: Secondary | ICD-10-CM

## 2022-01-29 DIAGNOSIS — E118 Type 2 diabetes mellitus with unspecified complications: Secondary | ICD-10-CM

## 2022-01-29 DIAGNOSIS — I4819 Other persistent atrial fibrillation: Secondary | ICD-10-CM | POA: Insufficient documentation

## 2022-01-29 HISTORY — PX: INGUINAL HERNIA REPAIR: SHX194

## 2022-01-29 LAB — GLUCOSE, CAPILLARY
Glucose-Capillary: 118 mg/dL — ABNORMAL HIGH (ref 70–99)
Glucose-Capillary: 141 mg/dL — ABNORMAL HIGH (ref 70–99)

## 2022-01-29 SURGERY — REPAIR, HERNIA, INGUINAL, ADULT
Anesthesia: General | Site: Groin | Laterality: Right

## 2022-01-29 MED ORDER — SODIUM CHLORIDE 0.9 % IR SOLN
Status: DC | PRN
  Administered 2022-01-29: 1000 mL

## 2022-01-29 MED ORDER — PROPOFOL 10 MG/ML IV BOLUS
INTRAVENOUS | Status: DC | PRN
  Administered 2022-01-29: 50 mg via INTRAVENOUS
  Administered 2022-01-29: 150 mg via INTRAVENOUS

## 2022-01-29 MED ORDER — BUPIVACAINE HCL (300 MG DOSE) 3 X 100 MG IL IMPL
DRUG_IMPLANT | Status: AC
  Filled 2022-01-29: qty 100

## 2022-01-29 MED ORDER — ONDANSETRON HCL 4 MG/2ML IJ SOLN
INTRAMUSCULAR | Status: DC | PRN
  Administered 2022-01-29: 4 mg via INTRAVENOUS

## 2022-01-29 MED ORDER — LIDOCAINE HCL (CARDIAC) PF 100 MG/5ML IV SOSY
PREFILLED_SYRINGE | INTRAVENOUS | Status: DC | PRN
  Administered 2022-01-29: 80 mg via INTRATRACHEAL

## 2022-01-29 MED ORDER — PROPOFOL 10 MG/ML IV BOLUS
INTRAVENOUS | Status: AC
  Filled 2022-01-29: qty 20

## 2022-01-29 MED ORDER — HYDROMORPHONE HCL 1 MG/ML IJ SOLN
0.2500 mg | INTRAMUSCULAR | Status: DC | PRN
  Administered 2022-01-29 (×2): 0.5 mg via INTRAVENOUS
  Filled 2022-01-29 (×2): qty 0.5

## 2022-01-29 MED ORDER — ONDANSETRON HCL 4 MG PO TABS: 4.0000 mg | ORAL_TABLET | Freq: Three times a day (TID) | ORAL | 1 refills | Status: DC | PRN

## 2022-01-29 MED ORDER — BUPIVACAINE HCL (300 MG DOSE) 3 X 100 MG IL IMPL
DRUG_IMPLANT | Status: DC | PRN
Start: 2022-01-29 — End: 2022-01-29
  Administered 2022-01-29: 300 mg

## 2022-01-29 MED ORDER — DEXAMETHASONE SODIUM PHOSPHATE 4 MG/ML IJ SOLN
INTRAMUSCULAR | Status: DC | PRN
  Administered 2022-01-29: 8 mg via INTRAVENOUS

## 2022-01-29 MED ORDER — CEFAZOLIN SODIUM-DEXTROSE 2-4 GM/100ML-% IV SOLN
INTRAVENOUS | Status: AC
  Filled 2022-01-29: qty 100

## 2022-01-29 MED ORDER — PHENYLEPHRINE HCL (PRESSORS) 10 MG/ML IV SOLN
INTRAVENOUS | Status: DC | PRN
  Administered 2022-01-29: 160 ug via INTRAVENOUS

## 2022-01-29 MED ORDER — CHLORHEXIDINE GLUCONATE 0.12 % MT SOLN
OROMUCOSAL | Status: AC
  Filled 2022-01-29: qty 15

## 2022-01-29 MED ORDER — ORAL CARE MOUTH RINSE: 15.0000 mL | Freq: Once | OROMUCOSAL | Status: AC

## 2022-01-29 MED ORDER — OXYCODONE-ACETAMINOPHEN 5-325 MG PO TABS
1.0000 | ORAL_TABLET | Freq: Once | ORAL | Status: AC
  Administered 2022-01-29: 1 via ORAL
  Filled 2022-01-29: qty 1

## 2022-01-29 MED ORDER — FENTANYL CITRATE (PF) 100 MCG/2ML IJ SOLN
INTRAMUSCULAR | Status: DC | PRN
  Administered 2022-01-29 (×3): 50 ug via INTRAVENOUS

## 2022-01-29 MED ORDER — OXYCODONE HCL 5 MG PO TABS: 5.0000 mg | ORAL_TABLET | ORAL | 0 refills | Status: DC | PRN

## 2022-01-29 MED ORDER — ONDANSETRON HCL 4 MG/2ML IJ SOLN: 4.0000 mg | Freq: Once | INTRAMUSCULAR | Status: DC | PRN

## 2022-01-29 MED ORDER — FENTANYL CITRATE (PF) 100 MCG/2ML IJ SOLN
INTRAMUSCULAR | Status: AC
Start: 2022-01-29 — End: ?
  Filled 2022-01-29: qty 2

## 2022-01-29 MED ORDER — CHLORHEXIDINE GLUCONATE CLOTH 2 % EX PADS: 6.0000 | MEDICATED_PAD | Freq: Once | CUTANEOUS | Status: DC

## 2022-01-29 MED ORDER — PHENYLEPHRINE 80 MCG/ML (10ML) SYRINGE FOR IV PUSH (FOR BLOOD PRESSURE SUPPORT)
PREFILLED_SYRINGE | INTRAVENOUS | Status: AC
  Filled 2022-01-29: qty 10

## 2022-01-29 MED ORDER — CEFAZOLIN SODIUM-DEXTROSE 2-4 GM/100ML-% IV SOLN
2.0000 g | INTRAVENOUS | Status: AC
  Administered 2022-01-29: 2 g via INTRAVENOUS

## 2022-01-29 MED ORDER — CHLORHEXIDINE GLUCONATE CLOTH 2 % EX PADS
6.0000 | MEDICATED_PAD | Freq: Once | CUTANEOUS | Status: DC
Start: 2022-01-29 — End: 2022-01-29

## 2022-01-29 MED ORDER — CHLORHEXIDINE GLUCONATE 0.12 % MT SOLN
15.0000 mL | Freq: Once | OROMUCOSAL | Status: AC
  Administered 2022-01-29: 15 mL via OROMUCOSAL

## 2022-01-29 MED ORDER — LACTATED RINGERS IV SOLN: INTRAVENOUS | Status: DC

## 2022-01-29 SURGICAL SUPPLY — 32 items
ADH SKN CLS APL DERMABOND .7 (GAUZE/BANDAGES/DRESSINGS) ×1
CLOTH BEACON ORANGE TIMEOUT ST (SAFETY) ×2 IMPLANT
COVER LIGHT HANDLE STERIS (MISCELLANEOUS) ×4 IMPLANT
DERMABOND ADVANCED (GAUZE/BANDAGES/DRESSINGS) ×1
DERMABOND ADVANCED .7 DNX12 (GAUZE/BANDAGES/DRESSINGS) ×1 IMPLANT
DRAIN PENROSE 0.5X18 (DRAIN) ×2 IMPLANT
ELECT REM PT RETURN 9FT ADLT (ELECTROSURGICAL) ×2
ELECTRODE REM PT RTRN 9FT ADLT (ELECTROSURGICAL) ×1 IMPLANT
GAUZE SPONGE 4X4 12PLY STRL (GAUZE/BANDAGES/DRESSINGS) ×2 IMPLANT
GLOVE BIO SURGEON STRL SZ 6.5 (GLOVE) ×3 IMPLANT
GLOVE BIOGEL PI IND STRL 6.5 (GLOVE) ×1 IMPLANT
GLOVE BIOGEL PI IND STRL 7.0 (GLOVE) ×2 IMPLANT
GLOVE BIOGEL PI INDICATOR 6.5 (GLOVE) ×1
GLOVE BIOGEL PI INDICATOR 7.0 (GLOVE) ×4
GLOVE SURG SS PI 6.5 STRL IVOR (GLOVE) ×1 IMPLANT
GOWN STRL REUS W/TWL LRG LVL3 (GOWN DISPOSABLE) ×7 IMPLANT
INST SET MINOR GENERAL (KITS) ×2 IMPLANT
KIT TURNOVER KIT A (KITS) ×2 IMPLANT
MANIFOLD NEPTUNE II (INSTRUMENTS) ×2 IMPLANT
MESH MARLEX PLUG MEDIUM (Mesh General) ×1 IMPLANT
NS IRRIG 1000ML POUR BTL (IV SOLUTION) ×2 IMPLANT
PACK MINOR (CUSTOM PROCEDURE TRAY) ×2 IMPLANT
PAD ARMBOARD 7.5X6 YLW CONV (MISCELLANEOUS) ×2 IMPLANT
SET BASIN LINEN APH (SET/KITS/TRAYS/PACK) ×2 IMPLANT
SOL PREP PROV IODINE SCRUB 4OZ (MISCELLANEOUS) ×2 IMPLANT
SUT MNCRL AB 4-0 PS2 18 (SUTURE) ×3 IMPLANT
SUT NOVA NAB GS-22 2 2-0 T-19 (SUTURE) ×4 IMPLANT
SUT VIC AB 2-0 CT1 27 (SUTURE) ×4
SUT VIC AB 2-0 CT1 TAPERPNT 27 (SUTURE) ×1 IMPLANT
SUT VIC AB 3-0 SH 27 (SUTURE) ×2
SUT VIC AB 3-0 SH 27X BRD (SUTURE) ×1 IMPLANT
SUT VICRYL AB 3 0 TIES (SUTURE) ×1 IMPLANT

## 2022-01-29 NOTE — Interval H&P Note (Signed)
History and Physical Interval Note: ? ?01/29/2022 ?7:25 AM ? ?Kenneth Watkins.  has presented today for surgery, with the diagnosis of RIGHT INGUINAL HERNIA.  The various methods of treatment have been discussed with the patient and family. After consideration of risks, benefits and other options for treatment, the patient has consented to  Procedure(s): ?HERNIA REPAIR INGUINAL ADULT W/ MESH (Right) as a surgical intervention.  The patient's history has been reviewed, patient examined, no change in status, stable for surgery.  I have reviewed the patient's chart and labs.  Questions were answered to the patient's satisfaction.   ? ? ?Virl Cagey ? ? ?

## 2022-01-29 NOTE — Op Note (Signed)
[facility] ?Operative Note ? ?01/29/22 ? ?Preoperative Diagnosis: Right inguinal hernia  ?  ?Postoperative Diagnosis: Same ?  ?Procedure(s) Performed: Right inguinal hernia repair with mesh ?  ?Surgeon: Lanell Matar. Constance Haw, MD ?  ?Assistants: No qualified resident was available ?  ?Anesthesia: General endotracheal ?  ?Anesthesiologist: Dr. Charna Elizabeth  ?  ?Specimens: None  ?  ?Estimated Blood Loss: Minimal ?  ?Blood Replacement: None  ?  ?Complications: None  ? ?Wound Class: Clean  ?  ?Operative Indications: Kenneth Watkins is a 76 yo with a symptomatic right inguinal hernia that came to see me. He is having more symptoms and we discussed repair and risk of bleeding, infection, injury to the vessel or cords, use of mesh and recurrence. He opted to proceed with a right sided repair.  ? ?Findings: Indirect hernia with cord lipoma and small direct defect without true hernia sac and only preperitoneal fat ?  ?Procedure: The patient was taken to the operating room and placed supine. General endotracheal anesthesia was induced. Intravenous antibiotics were administered per protocol.  A time out was preformed verifying the correct patient, procedure, site, positioning and implants.  The right groin and scrotum were prepared and draped in the usual sterile fashion.  ? ?An incision was made in a natural skin crease between the pubic tubercle and the anterior superior iliac spine.  The incision was deepened with electrocautery through Scarpa's and Camper's fascia until the aponeurosis of the external oblique was encountered.  This was cleaned and the external ring was exposed.  An incision was made in the midportion of the external oblique aponeurosis in the direction of its fibers. The ilioinguinal nerve was identified and was protected throughout the dissection.  Flaps of the external oblique were developed cephalad and inferiorly.   ? ?The cord was identified and it was gently dissented free at the pubic tubercle  and encircled with a Penrose drain.  Attention was then directed at the anteromedial aspect of the cord, where an a cord lipoma and a small indirect hernia sac was identified.  The sac and lipoma were carefully dissected free from the cord down to the level of the internal ring.  The vas and testicular vessels were identified and protected from harm.  Once the sac was dissected free from the cords, the Penrose was placed around the cord which was retracted inferiorly out of the field of view.  The cord lipoma was suture ligated and the small hernia was reduced into the internal ring without difficulty.  A medium Perfix Plug was placed into the defect and filled the space.  Attention was then turned to the floor of the canal, which was grossly weakened and there was a small 1 cm defect with fat protruding through it. I cleared this and noted only preperitoneal fat without any hernia sac. This was reduced and the defect was closed back with 2-0 Vicryl running suture. No other defects or sacs were noted. The Perfix Mesh Patch was sutured to the inguinal ligament inferiorly starting at the pubic tubercle using 2-0 Novafil interrupted sutures.  The mesh was sutured superiorly to the conjoint tendon using 2-0 Novafil interrupted sutures.  Care was taken to ensure the mesh was placed in a relaxed fashion to avoid excessive tension and no neurovascular structures were caught in the repair.  Laterally the tails of the mesh were crossed and the internal ring was recreated, allowing for passage of cords without tension.  ? ?Hemostasis was adequate.  The Penrose  was removed.  [name] was placed throughout the layers of the repair. The external oblique aponeurosis was closed with a 2-0 Vicryl suture in a running fashion, taking care to not catch the ilioinguinal nerve in the suture line.  Scarpa's fashion was closed with 3-0 Vicryl interrupted sutures. The skin was closed with a subcuticular 4-0 Monocryl suture.  Dermabond was  applied.  ? ?The testis was gently pulled down into its anatomic position in the scrotum. ? ?The patient tolerated the procedure well and was taken to the PACU in stable condition. All counts were correct at the end of the case.  ?   ? ?  ?Curlene Labrum, MD ?[facility] ?[place], [place] [address] ?[phone] (office) ? ? ? ?

## 2022-01-29 NOTE — Anesthesia Postprocedure Evaluation (Signed)
Anesthesia Post Note ? ?Patient: Jeffry Vogelsang. ? ?Procedure(s) Performed: HERNIA REPAIR INGUINAL ADULT WITH MESH (Right: Groin) ? ?Patient location during evaluation: Phase II ?Anesthesia Type: General ?Level of consciousness: awake and alert and oriented ?Pain management: pain level controlled ?Vital Signs Assessment: post-procedure vital signs reviewed and stable ?Respiratory status: spontaneous breathing, nonlabored ventilation and respiratory function stable ?Cardiovascular status: blood pressure returned to baseline and stable ?Postop Assessment: no apparent nausea or vomiting ?Anesthetic complications: no ? ? ?No notable events documented. ? ? ?Last Vitals:  ?Vitals:  ? 01/29/22 0945 01/29/22 0954  ?BP: (!) 153/86 (!) 176/78  ?Pulse: 68 66  ?Resp: 18 18  ?Temp:  36.7 ?C  ?SpO2:  99%  ?  ?Last Pain:  ?Vitals:  ? 01/29/22 0954  ?TempSrc: Oral  ?PainSc: 4   ? ? ?  ?  ?  ?  ?  ?  ? ?Keyra Virella C Velera [place] ? ? ? ? ?

## 2022-01-29 NOTE — Anesthesia Preprocedure Evaluation (Signed)
Anesthesia Evaluation  ?Patient identified by MRN, date of birth, ID band ?Patient awake ? ? ? ?Reviewed: ?Allergy & Precautions, NPO status , Patient's Chart, lab work & pertinent test results ? ?Airway ?Mallampati: II ? ?TM Distance: >3 FB ?Neck ROM: Full ? ? ? Dental ? ?(+) Dental Advisory Given, Teeth Intact ?Crowns :   ?Pulmonary ?neg pulmonary ROS,  ?  ?Pulmonary exam normal ?breath sounds clear to auscultation ? ? ? ? ? ? Cardiovascular ?Exercise Tolerance: Good ?hypertension, Pt. on medications ?+ CAD and + Cardiac Stents (last stent - 02/23/20)  ?Normal cardiovascular exam+ dysrhythmias Atrial Fibrillation  ?Rhythm:Regular Rate:Normal ? ? ?  ?Neuro/Psych ? Neuromuscular disease (pinched nerve in neck) negative psych ROS  ? GI/Hepatic ?Neg liver ROS, GERD  Medicated and Controlled,  ?Endo/Other  ?negative endocrine ROSdiabetes, Well Controlled, Type 2, Oral Hypoglycemic Agents ? Renal/GU ?Renal InsufficiencyRenal disease  ?negative genitourinary ?  ?Musculoskeletal ?negative musculoskeletal ROS ?(+)  ? Abdominal ?  ?Peds ?negative pediatric ROS ?(+)  Hematology ?negative hematology ROS ?(+)   ?Anesthesia Other Findings ? ? Reproductive/Obstetrics ?negative OB ROS ? ?  ? ? ? ? ? ? ? ? ? ? ? ? ? ?  ?  ? ? ? ? ? ? ?Anesthesia Physical ?Anesthesia Plan ? ?ASA: 3 ? ?Anesthesia Plan: General  ? ?Post-op Pain Management: Dilaudid IV  ? ?Induction: Intravenous ? ?PONV Risk Score and Plan: 3 and Ondansetron and Dexamethasone ? ?Airway Management Planned: LMA ? ?Additional Equipment:  ? ?Intra-op Plan:  ? ?Post-operative Plan: Extubation in OR ? ?Informed Consent: I have reviewed the patients History and Physical, chart, labs and discussed the procedure including the risks, benefits and alternatives for the proposed anesthesia with the patient or authorized representative who has indicated his/her understanding and acceptance.  ? ? ? ?Dental advisory given ? ?Plan Discussed with: CRNA  and Surgeon ? ?Anesthesia Plan Comments: (Pinched nerve in neck, keep neck in neutral position)  ? ? ? ? ? ?Anesthesia Quick Evaluation ? ?

## 2022-01-29 NOTE — Discharge Instructions (Addendum)
Discharge Instructions Hernia: ? ?RESTART ELIQUIS: 01/31/2022- if you have excess bruising before then, call the office to let us know and will hold it longer.  ? ?Common Complaints: ?Pain at the incision site is common. This will improve with time. Take your pain medications as described below. ?Some nausea is common and poor appetite. The main goal is to stay hydrated the first few days after surgery.  ?Numbness at the incision or the thigh is common.  ?If you start to have burning or tingling pain in your groin, this is from a nerve being pinched. Please call and we can prescribe you a different type of pain medication for nerve pain.  ?Place a towel under your scrotum and ice over the area to help with swelling and bruising.  ? ?Diet/ Activity: ?Diet as tolerated. You may not have an appetite, but it is important to stay hydrated. Drink 64 ounces of water a day. Your appetite will return with time.  ?Shower per your regular routine daily.  Do not take hot showers. Take warm showers that are less than 10 minutes. ?Rest and listen to your body, but do not remain in bed all day.  ?Walk everyday for at least 15-20 minutes. Deep cough and move around every 1-2 hours in the first few days after surgery.  ?Do not pick at the dermabond glue on your incision sites.  This glue film will remain in place for 1-2 weeks and will start to peel off.  ?Do not place lotions or balms on your incision unless instructed to specifically by Dr. Constance Haw.  ?Do not lift > 10 lbs, perform excessive bending, pushing, pulling, squatting for 6-8 weeks after surgery.  ? ?Pain Expectations and Narcotics: ?-After surgery you will have pain associated with your incisions and this is normal. The pain is muscular and nerve pain, and will get better with time. ?-You are encouraged and expected to take non narcotic medications like tylenol and ibuprofen (when able) to treat pain as multiple modalities can aid with pain treatment. ?-Narcotics are only  used when pain is severe or there is breakthrough pain. ?-You are not expected to have a pain score of 0 after surgery, as we cannot prevent pain. A pain score of 3-4 that allows you to be functional, move, walk, and tolerate some activity is the goal. The pain will continue to improve over the days after surgery and is dependent on your surgery. ?-Due to [place] law, we are only able to give a certain amount of pain medication to treat post operative pain, and we only give additional narcotics on a patient by patient basis.  ?-For most laparoscopic surgery, studies have shown that the majority of patients only need 10-15 narcotic pills, and for open surgeries most patients only need 15-20.   ?-Having appropriate expectations of pain and knowledge of pain management with non narcotics is important as we do not want anyone to become addicted to narcotic pain medication.  ?-Using ice packs in the first 48 hours and heating pads after 48 hours, wearing an abdominal binder (when recommended), and using over the counter medications are all ways to help with pain management.   ?-Simple acts like meditation and mindfulness practices after surgery can also help with pain control and research has proven the benefit of these practices. ? ?Medication: ?Take tylenol and ibuprofen as needed for pain control, alternating every 4-6 hours.  ?Example:  ?Tylenol '1000mg'$  @ 6am, 12noon, 6pm, 63mdnight (Do not exceed '4000mg'$  of tylenol  a day). Ibuprofen '800mg'$  @ 9am, 3pm, 9pm, 3am (Do not exceed '3600mg'$  of ibuprofen a day).  ?Take Roxicodone for breakthrough pain every 4 hours.  ?Take Colace for constipation related to narcotic pain medication. If you do not have a bowel movement in 2 days, take Miralax over the counter.  ?Drink plenty of water to also prevent constipation.  ? ?Contact Information: ?If you have questions or concerns, please call our office, [phone], Monday- Thursday 8AM-5PM and Friday 8AM-12Noon.  ?If it is after hours or  on the weekend, please call [facility] Main [facility], [phone], [phone], and ask to speak to the surgeon on call for Dr. Constance Haw at [facility].  ? ?

## 2022-01-29 NOTE — Transfer of Care (Signed)
Immediate Anesthesia Transfer of Care Note ? ?Patient: Kenneth Watkins. ? ?Procedure(s) Performed: HERNIA REPAIR INGUINAL ADULT WITH MESH (Right: Groin) ? ?Patient Location: PACU ? ?Anesthesia Type:General ? ?Level of Consciousness: awake, alert  and oriented ? ?Airway & Oxygen Therapy: Patient Spontanous Breathing ? ?Post-op Assessment: Report given to RN and Post -op Vital signs reviewed and stable ? ?Post vital signs: Reviewed and stable ? ?Last Vitals:  ?Vitals Value Taken Time  ?BP    ?Temp    ?Pulse 98 01/29/22 0910  ?Resp 22 01/29/22 0910  ?SpO2 93 % 01/29/22 0910  ?Vitals shown include unvalidated device data. ? ?Last Pain:  ?Vitals:  ? 01/29/22 0657  ?TempSrc: Oral  ?PainSc: 0-No pain  ?   ? ?  ? ?Complications: No notable events documented. ?

## 2022-01-29 NOTE — Progress Notes (Addendum)
[facility] ? ?Updated wife. Restart eliquis 5/17 and keep eye on bruising. Ice over area. Rx to [facility].  ? ?No heavy lifting > 10 lbs, excessive bending, pushing, pulling, or squatting for 6-8 weeks after surgery.  ?Will see in clinic in 4 weeks. ? ?Needs to urinate before discharge.  ? ?Curlene Labrum, MD ?[facility] ?[place], [place] [address] ?[phone] (office) ? ?

## 2022-01-29 NOTE — Anesthesia Procedure Notes (Signed)
Procedure Name: LMA Insertion ?Date/Time: 01/29/2022 7:35 AM ?Performed by: Minerva Ends, CRNA ?Pre-anesthesia Checklist: Patient identified, Emergency Drugs available, Suction available and Patient being monitored ?Patient Re-evaluated:Patient Re-evaluated prior to induction ?Oxygen Delivery Method: Circle system utilized ?Preoxygenation: Pre-oxygenation with 100% oxygen ?Induction Type: IV induction ?LMA: LMA inserted ?LMA Size: 4.0 ?Tube type: Oral ?Number of attempts: 1 ?Airway Equipment and Method: Stylet and Oral airway ?Placement Confirmation: positive ETCO2 and breath sounds checked- equal and bilateral ?Tube secured with: Tape ?Dental Injury: Teeth and Oropharynx as per pre-operative assessment  ? ? ? ? ?

## 2022-01-30 ENCOUNTER — Encounter (HOSPITAL_COMMUNITY): Payer: Self-pay | Admitting: General Surgery

## 2022-02-02 ENCOUNTER — Telehealth: Payer: Self-pay | Admitting: *Deleted

## 2022-02-02 NOTE — Telephone Encounter (Signed)
Received following e-mail from patient on 02/01/2022: Good morning Dr. Constance Haw,  I have a few questions concerning my recovery activities.  I am doing well, with very little pain.  What activities can I resume during the 6 weeks period following my surgery?  For example, driving car, mowing yard with self propelled push mower,  playing golf (I normally walk using a push cart, but I can use a riding cart), sexual intercourse, etc.  Here is some input on my hospital visit for the surgery.  Nurses and surgical staff were all great, professional , and made me feel comfortable as much as possible,  your bedside manner is really good, which also makes the patients feel more comfortable .  I appreciate your explanations as to what the situation is and how you can repair it.  Thanks for everything.  Kenneth Watkins, Kenneth Watkins [phone]  Dr. Constance Haw reviewed and sent following message to patient on 02/01/2022: [name],  Thank you for the compliments. Glad you are well.  Driving a car- i tell people to wait until they can slam on the breaks and turn wheel without issues and are not taking narcotics.  I would say most people can drive by 2 weeks. Some before.  Mowing with self propelled mower- should be fine after 1-2 weeks if not having to pick it up. Would start slow and would not move whole yard but start with half and work up.   Golf is something that can pull on the groin with swing. Putting is fine now. I would say that a full swing prob best to wait until 4 weeks and start with club swinging; work up to 9 and then 18.   Sexual intercourse is fine but if have pain then would do another position.  Hope this helps.  Curlene Labrum

## 2022-02-27 ENCOUNTER — Encounter: Payer: Self-pay | Admitting: General Surgery

## 2022-02-27 ENCOUNTER — Ambulatory Visit (INDEPENDENT_AMBULATORY_CARE_PROVIDER_SITE_OTHER): Payer: Medicare Other | Admitting: General Surgery

## 2022-02-27 VITALS — BP 134/73 | HR 71 | Temp 98.2°F | Resp 12 | Ht 73.0 in | Wt 191.0 lb

## 2022-02-27 DIAGNOSIS — K4020 Bilateral inguinal hernia, without obstruction or gangrene, not specified as recurrent: Secondary | ICD-10-CM

## 2022-02-27 NOTE — Progress Notes (Signed)
[facility]  Doing well after hernia repair. Feeling good. Minimal pain. Some minor soreness on the lower abdomen at times.  BP 134/73   Pulse 71   Temp 98.2 F (36.8 C) (Oral)   Resp 12   Ht '6\' 1"'$  (1.854 m)   Wt 191 lb (86.6 kg)   SpO2 98%   BMI 25.20 kg/m  Right inguinal incision healing, some induration, no erythema or drainage  Patient s/p right inguinal hernia repair with mesh. Doing well.   Continue to increase activity.  No heavy lifting > 10 lbs, excessive bending, pushing, pulling, or squatting for 6-8 weeks after surgery.   Curlene Labrum, MD [facility] [address] [place], [place] [address] [phone] (office)

## 2022-02-27 NOTE — Patient Instructions (Signed)
Continue to increase activity.  No heavy lifting > 10 lbs, excessive bending, pushing, pulling, or squatting for 6-8 weeks after surgery.

## 2022-03-17 NOTE — Progress Notes (Unsigned)
Virtual Visit via Telephone Note   Because of Kenneth Montalto Jr.'s co-morbid illnesses, he is at least at moderate risk for complications without adequate follow up.  This format is felt to be most appropriate for this patient at this time.  The patient did not have access to video technology/had technical difficulties with video requiring transitioning to audio format only (telephone).  All issues noted in this document were discussed and addressed.  No physical exam could be performed with this format.  Please refer to the patient's chart for his consent to telehealth for [facility].    Date:  03/19/2022   ID:  Kenneth Paterson., DOB [birth year], MRN [id number] The patient was identified using 2 identifiers.  Patient Location: Home Provider Location: Home Office   PCP:  Lemmie Evens, [facility] Cardiologist:  Rozann Lesches, MD {  Evaluation Performed:  Follow-Up Visit  Chief Complaint:  Cardiac follow-up  History of Present Illness:    Kenneth Cowie. is a 76 y.o. male last seen in March by Ms. Dunn PA-C, I reviewed her note.  Patient requested telephone encounter instead of office visit today due to recent URI.  He tested negative for COVID-19, reported cough and chest congestion, symptoms also present in his wife.  He is starting to feel better.  Interestingly, did develop atrial fibrillation within the last 24 hours, last episode was earlier in the year.  He underwent right inguinal hernia mesh repair with Dr. Constance Haw in May.  Did well, no obvious perioperative cardiac complications.  I reviewed his medications, he continues on Eliquis for stroke prophylaxis, also Calan SR.  Past Medical History:  Diagnosis Date   BPH (benign prostatic hyperplasia)    CAD (coronary artery disease)    a. s/p DES to OM1 and DES to RCA in 02/2020   CKD (chronic kidney disease) stage 3, GFR 30-59 ml/min (HCC)    Hyperlipidemia    Hypertension    Persistent atrial  fibrillation ([place])    Pinched nerve in neck    Type 2 diabetes mellitus ([place])    Past Surgical History:  Procedure Laterality Date   COLONOSCOPY WITH PROPOFOL N/A 04/21/2021   Procedure: COLONOSCOPY WITH PROPOFOL;  Surgeon: Harvel Quale, MD;  Location: [facility];  Service: Gastroenterology;  Laterality: N/A;  7:30   CORONARY STENT INTERVENTION N/A 02/24/2020   Procedure: CORONARY STENT INTERVENTION;  Surgeon: Troy Sine, MD;  Location: [place] [facility];  Service: Cardiovascular;  Laterality: N/A;   EYE SURGERY     INGUINAL HERNIA REPAIR Right 01/29/2022   Procedure: HERNIA REPAIR INGUINAL ADULT WITH MESH;  Surgeon: Virl Cagey, MD;  Location: [facility];  Service: General;  Laterality: Right;   LEFT HEART CATH AND CORONARY ANGIOGRAPHY N/A 02/23/2020   Procedure: LEFT HEART CATH AND CORONARY ANGIOGRAPHY;  Surgeon: Troy Sine, MD;  Location: [place] [facility];  Service: Cardiovascular;  Laterality: N/A;   TONSILLECTOMY     VASECTOMY       Current Meds  Medication Sig   Accu-Chek Softclix Lancets lancets 1 each by Other route as needed.   acetaminophen (TYLENOL) 650 MG CR tablet Take 650 mg by mouth every 8 (eight) hours as needed for pain.   apixaban (ELIQUIS) 5 MG TABS tablet Take 5 mg by mouth 2 (two) times daily.   atorvastatin (LIPITOR) 80 MG tablet Take 1 tablet (80 mg total) by mouth daily.   B-D 3CC LUER-LOK SYR  22GX1" 22G X 1" 3 ML MISC 1 each by Other route as needed.   BD DISP NEEDLES 18G X 1-1/2" MISC 1 each by Other route as needed.   benazepril (LOTENSIN) 20 MG tablet Take 20 mg by mouth daily.   chlorthalidone (HYGROTON) 25 MG tablet Take 25 mg by mouth daily.   clotrimazole-betamethasone (LOTRISONE) cream Apply 1 application topically daily as needed (Rash).    Coenzyme Q10 (CO Q 10 PO) Take 1 capsule by mouth daily.   CONTOUR NEXT TEST test strip 1 each by Other route daily.   famotidine (PEPCID) 10 MG tablet Take 10 mg by mouth daily as needed  for heartburn or indigestion.   JANUVIA 100 MG tablet Take 100 mg by mouth daily.   Misc Natural Products (MENS PROSTATE HEALTH FORMULA PO) Take 1 tablet by mouth in the morning and at bedtime. Prostate Formula: Real Health   Misc Natural Products (OSTEO BI-FLEX TRIPLE STRENGTH PO) Take 1 tablet by mouth in the morning and at bedtime.   mometasone (NASONEX) 50 MCG/ACT nasal spray Place 2 sprays into the nose daily as needed (allergies).   Multiple Vitamins-Minerals (MULTIVITAMIN WITH MINERALS) tablet Take 1 tablet by mouth daily. Men's   nitroGLYCERIN (NITROSTAT) 0.4 MG SL tablet Place 1 tablet (0.4 mg total) under the tongue every 5 (five) minutes as needed for chest pain.   tadalafil (CIALIS) 10 MG tablet Take 1 tablet by mouth as needed for erectile dysfunction.   testosterone cypionate (DEPOTESTOSTERONE CYPIONATE) 200 MG/ML injection Inject 200 mg into the muscle every 14 (fourteen) days.    verapamil (CALAN-SR) 120 MG CR tablet TAKE 1 TABLET BY MOUTH AT BEDTIME.     Allergies:   Codeine and Farxiga [dapagliflozin]   ROS:   Please see the history of present illness. All other systems reviewed and are negative.   Prior CV studies:   The following studies were reviewed today:  Cardiac catheterization 02/23/2020: Prox RCA lesion is 90% stenosed. Lat 3rd Mrg lesion is 70% stenosed. 1st Mrg lesion is 85% stenosed. Mid LAD lesion is 30% stenosed.   Multivessel coronary productive disease with 30% LAD stenosis between the first and second diagonal vessel; large codominant left circumflex coronary artery with 85% stenosis in a large proximal OM1 vessel, 20% AV groove circumflex stenosis with 70% distal stenosis in the OM 2 vessel; codominant RCA with 90% focal stenosis in the region of the RV marginal branch takeoff.   Normal LV function with EF estimate at 55%.  LVEDP 13 mmHg.   RECOMMENDATION: The patient will be brought back to the laboratory tomorrow for two-vessel PCI via the femoral  approach due to significant angle takeoff with difficulty and catheter kinking via the radial approach.  The patient was loaded with Plavix today.  He will start on 75 mg of Plavix in the morning.  We will hydrate.  Will change simvastatin to atorvastatin 80 mg for more potency statin therapy. Plan two-vessel PCI tomorrow.   PCI 02/24/2020: Mid LAD lesion is 30% stenosed. Lat 3rd Mrg lesion is 70% stenosed. 1st Mrg lesion is 85% stenosed. Prox RCA lesion is 90% stenosed. Post intervention, there is a 0% residual stenosis. Post intervention, there is a 0% residual stenosis. A stent was successfully placed. A stent was successfully placed.   Successful two-vessel coronary intervention with insertion of a 2.5 x 15 mm Resolute Onyx DES stent postdilated to 2.75 mm in the OM1 vessel of the left circumflex coronary artery with the 85% stenosis  being reduced to 0%; and the 90% mid RCA stenosis treated with a 2.0 x 15 mm Resolute stent postdilated to 2.25 mm with the 90% stenosis being reduced to 0%.   RECOMMENDATION: The patient will be hydrated overnight.  Reinstitute Eliquis 5 mg twice a day tomorrow with his history of PAF.  Triple drug therapy with aspirin 81 mg/Plavix 75 mg daily and Eliquis for 1 month; then DC aspirin with Plavix/Eliquis for 6 months probably then can DC Plavix and possibly resume aspirin 81 mg if no contraindication.  Medical therapy for mild concomitant CAD. Aggressive lipid-lowering therapy with LDL cholesterol 60 or below. Consider possible sleep evaluation with the patient's PAF history.  Labs/Other Tests and Data Reviewed:    EKG:  An ECG dated 06/28/2021 was personally reviewed today and demonstrated:  Sinus rhythm with prolonged PR interval.  Recent Labs: 03/29/2021: Hemoglobin 15.2; Platelets 191 01/26/2022: BUN 29; Creatinine, Ser 1.37; Potassium 3.5; Sodium 136   Recent Lipid Panel Lab Results  Component Value Date/Time   CHOL 148 08/17/2020 10:30 AM   TRIG 206  (H) 08/17/2020 10:30 AM   HDL 37 (L) 08/17/2020 10:30 AM   CHOLHDL 4.0 08/17/2020 10:30 AM   LDLCALC 81 08/17/2020 10:30 AM    Wt Readings from Last 3 Encounters:  03/19/22 187 lb (84.8 kg)  02/27/22 191 lb (86.6 kg)  01/26/22 188 lb (85.3 kg)       Objective:    Vital Signs:  BP (!) 118/96   Pulse 82   Ht '6\' 1"'$  (1.854 m)   Wt 187 lb (84.8 kg)   BMI 24.67 kg/m    ASSESSMENT & PLAN:    1.  Paroxysmal atrial fibrillation with CHA2DS2-VASc score of 4.  Continuing current medical regimen including Eliquis and Calan SR.  He has been seen in the atrial fibrillation clinic with discussion regarding possible antiarrhythmic therapy or ablation if breakthrough episodes increase in frequency.  We will see how he does with the current event, suspect he will convert spontaneously as he did before.  2.  CAD status post DES to the OM 1 and DES to the mid RCA in June 2021.  No recent angina symptoms.  No nitroglycerin use in the interim.  Continue Lotensin and Lipitor.   Time:   Today, I have spent 11 minutes with the patient with telehealth technology discussing the above problems.     Medication Adjustments/Labs and Tests Ordered: Current medicines are reviewed at length with the patient today.  Concerns regarding medicines are outlined above.   Tests Ordered: No orders of the defined types were placed in this encounter.   Medication Changes: No orders of the defined types were placed in this encounter.   Follow Up:  In Person  6 months.  Signed, Rozann Lesches, MD  03/19/2022 10:03 AM    [place]

## 2022-03-19 ENCOUNTER — Ambulatory Visit (INDEPENDENT_AMBULATORY_CARE_PROVIDER_SITE_OTHER): Payer: Medicare Other | Admitting: Cardiology

## 2022-03-19 ENCOUNTER — Encounter: Payer: Self-pay | Admitting: Cardiology

## 2022-03-19 VITALS — BP 118/96 | HR 82 | Ht 73.0 in | Wt 187.0 lb

## 2022-03-19 DIAGNOSIS — I25119 Atherosclerotic heart disease of native coronary artery with unspecified angina pectoris: Secondary | ICD-10-CM

## 2022-03-19 DIAGNOSIS — I480 Paroxysmal atrial fibrillation: Secondary | ICD-10-CM

## 2022-03-19 NOTE — Progress Notes (Signed)
  Patient Consent for Virtual Visit     { Kenneth Watkins. has provided verbal consent on 03/19/2022 for a virtual visit (video or telephone).  CONSENT FOR VIRTUAL VISIT FOR:  Kenneth Watkins.  By participating in this virtual visit I agree to the following:  I hereby voluntarily request, consent and authorize [facility] and its employed or contracted physicians, physician assistants, nurse practitioners or other licensed health care professionals (the Practitioner), to provide me with telemedicine health care services (the "Services") as deemed necessary by the treating Practitioner. I acknowledge and consent to receive the Services by the Practitioner via telemedicine. I understand that the telemedicine visit will involve communicating with the Practitioner through live audiovisual communication technology and the disclosure of certain medical information by electronic transmission. I acknowledge that I have been given the opportunity to request an in-person assessment or other available alternative prior to the telemedicine visit and am voluntarily participating in the telemedicine visit.  I understand that I have the right to withhold or withdraw my consent to the use of telemedicine in the course of my care at any time, without affecting my right to future care or treatment, and that the Practitioner or I may terminate the telemedicine visit at any time. I understand that I have the right to inspect all information obtained and/or recorded in the course of the telemedicine visit and may receive copies of available information for a reasonable fee.  I understand that some of the potential risks of receiving the Services via telemedicine include:  Delay or interruption in medical evaluation due to technological equipment failure or disruption; Information transmitted may not be sufficient (e.g. poor resolution of images) to allow for appropriate medical decision making by the Practitioner; and/or   In rare instances, security protocols could fail, causing a breach of personal health information.  Furthermore, I acknowledge that it is my responsibility to provide information about my medical history, conditions and care that is complete and accurate to the best of my ability. I acknowledge that Practitioner's advice, recommendations, and/or decision may be based on factors not within their control, such as incomplete or inaccurate data provided by me or distortions of diagnostic images or specimens that may result from electronic transmissions. I understand that the practice of medicine is not an exact science and that Practitioner makes no warranties or guarantees regarding treatment outcomes. I acknowledge that a copy of this consent can be made available to me via my patient portal ([facility]), or I can request a printed copy by calling the office of [facility].    I understand that my insurance will be billed for this visit.   I have read or had this consent read to me. I understand the contents of this consent, which adequately explains the benefits and risks of the Services being provided via telemedicine.  I have been provided ample opportunity to ask questions regarding this consent and the Services and have had my questions answered to my satisfaction. I give my informed consent for the services to be provided through the use of telemedicine in my medical care

## 2022-03-19 NOTE — Patient Instructions (Signed)
Medication Instructions:  Continue all current medications.   Labwork: none  Testing/Procedures: none  Follow-Up: 6 months   Any Other Special Instructions Will Be Listed Below (If Applicable).   If you need a refill on your cardiac medications before your next appointment, please call your pharmacy.  

## 2022-04-02 ENCOUNTER — Ambulatory Visit: Payer: Medicare Other | Admitting: Cardiology

## 2022-04-04 ENCOUNTER — Encounter: Payer: Self-pay | Admitting: Nutrition

## 2022-04-04 ENCOUNTER — Encounter: Payer: Medicare Other | Attending: Family Medicine | Admitting: Nutrition

## 2022-04-04 VITALS — Ht 71.0 in | Wt 193.0 lb

## 2022-04-04 DIAGNOSIS — Z713 Dietary counseling and surveillance: Secondary | ICD-10-CM | POA: Insufficient documentation

## 2022-04-04 DIAGNOSIS — E119 Type 2 diabetes mellitus without complications: Secondary | ICD-10-CM | POA: Insufficient documentation

## 2022-04-04 DIAGNOSIS — I2510 Atherosclerotic heart disease of native coronary artery without angina pectoris: Secondary | ICD-10-CM

## 2022-04-04 DIAGNOSIS — E782 Mixed hyperlipidemia: Secondary | ICD-10-CM

## 2022-04-04 DIAGNOSIS — N182 Chronic kidney disease, stage 2 (mild): Secondary | ICD-10-CM

## 2022-04-04 DIAGNOSIS — I10 Essential (primary) hypertension: Secondary | ICD-10-CM

## 2022-04-04 DIAGNOSIS — E118 Type 2 diabetes mellitus with unspecified complications: Secondary | ICD-10-CM

## 2022-04-04 NOTE — Progress Notes (Signed)
Medical Nutrition Therapy Begin  1345     End  1445  Primary concerns today: Dm Type 2,   Referral diagnosis: E11.8 Preferred learning style: Visual and read  Learning readiness: Ready    NUTRITION ASSESSMENT Follow up DM FBS: 110-130's     Had been travelilng a lot and thinks that is the reason for elevated BS. 04/02/22: 6.3% down from 7.6% (01/27/22) . Creatine 1.39, eGFR 53 mg/dl. Has stopped eating after 7 pm. Hernia repair on scrotum recently and hasn't been as active. Just got released to be more active by MD.  Changed: eating a lot more green vegetables, whole grains, chicken, Fresh fruit.  Anthropometrics  Wt Readings from Last 3 Encounters:  03/19/22 187 lb (84.8 kg)  02/27/22 191 lb (86.6 kg)  01/26/22 188 lb (85.3 kg)   Ht Readings from Last 3 Encounters:  03/19/22 6' 1" (1.854 m)  02/27/22 6' 1" (1.854 m)  01/26/22 6' 1" (1.854 m)   There is no height or weight on file to calculate BMI. @BMIFA@ Facility age limit for growth %iles is 20 years. Facility age limit for growth %iles is 20 years.    Clinical Medical Hx: CKD, CAD, Obesity, DM Medications: Jardiance  Labs: Notable Signs/Symptoms: fatigue  Lifestyle & Dietary Hx Married. He and his wife cook and eat mostly at home. DX 2013.  Estimated daily fluid intake: 84 oz Supplements: MVI Sleep: 8 Stress / self-care: None Current average weekly physical activity: walks a lot during playing golf.  24-Hr Dietary Recall First Meal:  8 am  2 pancakes and a banana, water Snack:  Second Meal: Banana sandwhich, Diet pepsi or water or crystal. Snack:  Third Meal:  6 pm grilled chicken, green beans, carrots and corn, water Snack: walnuts Beverages: water  Estimated Energy Needs Calories: 1500 Carbohydrate: 170 Protein: 112g Fat: 42g   NUTRITION DIAGNOSIS  NB-1.1 Food and nutrition-related knowledge deficit As related to DIabetes Type.  As evidenced by > 6.5%.   NUTRITION INTERVENTION  Nutrition  education (E-1) on the following topics:  Nutrition and Diabetes education provided on My Plate, CHO counting, meal planning, portion sizes, timing of meals, avoiding snacks between meals unless having a low blood sugar, target ranges for A1C and blood sugars, signs/symptoms and treatment of hyper/hypoglycemia, monitoring blood sugars, taking medications as prescribed, benefits of exercising 30 minutes per day and prevention of complications of DM.`   Handouts Provided Include  Lifestyle Medicine Lifestyle nutrition Meal Plan Card  Learning Style & Readiness for Change Teaching method utilized: Visual & Auditory  Demonstrated degree of understanding via: Teach Back  Barriers to learning/adherence to lifestyle change: none  Goals Established by Pt  Great job!! Continue to focus on more plant based foods Cut diet sodas and artificial sugar substitues. Keep water intake to 60-80 oz Get back to exercising as tolerated.   MONITORING & EVALUATION Dietary intake, weekly physical activity, and blood sugars and weight in 3 month.  Next Steps  Patient is to work on meal planning, exercise, portion control.. 

## 2022-04-04 NOTE — Patient Instructions (Addendum)
Goals Great job!! Continue to focus on more plant based foods Cut diet sodas and artificial sugar sub. Keep water intake to 60-80 oz Get back to exercising as tolerated.

## 2022-04-09 ENCOUNTER — Encounter: Payer: Self-pay | Admitting: Nutrition

## 2022-04-16 ENCOUNTER — Telehealth: Payer: Self-pay | Admitting: *Deleted

## 2022-04-16 NOTE — Telephone Encounter (Signed)
Received following e-mail from patient: Hello Dr Constance Haw,  I have a question concerning my Hernia surgery performed on May 15th.    I have an area that feels numb.  It is in my groin on my right side in the crotch. I don't really notice it except when I am showering and wash that area.  It has no feeling.    Is this anything for me to be concerned about?  Regards, Kenneth Watkins   Dr. Constance Haw reviewed message and replied as follows: Mr. Nelis,  Numbness at the hernia site, especially right around it, or in the groin area is very common after this type of procedure. The cutaneous or skin nerves are cut in performing the procedure.  Some people get some feeling back after a period of time, but that is usually Riyanna Crutchley or more months. Some people do not get feeling back at all.  There's nothing to be concerned about. I hope you're doing well.  Curlene Labrum

## 2022-06-25 DIAGNOSIS — M26629 Arthralgia of temporomandibular joint, unspecified side: Secondary | ICD-10-CM | POA: Insufficient documentation

## 2022-06-25 DIAGNOSIS — H9113 Presbycusis, bilateral: Secondary | ICD-10-CM | POA: Insufficient documentation

## 2022-07-03 DIAGNOSIS — Z955 Presence of coronary angioplasty implant and graft: Secondary | ICD-10-CM | POA: Insufficient documentation

## 2022-07-04 ENCOUNTER — Other Ambulatory Visit (HOSPITAL_COMMUNITY): Payer: Self-pay | Admitting: Nephrology

## 2022-07-04 DIAGNOSIS — E1122 Type 2 diabetes mellitus with diabetic chronic kidney disease: Secondary | ICD-10-CM

## 2022-07-04 DIAGNOSIS — I129 Hypertensive chronic kidney disease with stage 1 through stage 4 chronic kidney disease, or unspecified chronic kidney disease: Secondary | ICD-10-CM

## 2022-07-11 ENCOUNTER — Ambulatory Visit (HOSPITAL_COMMUNITY)
Admission: RE | Admit: 2022-07-11 | Discharge: 2022-07-11 | Disposition: A | Discharge status: Home / Self Care | Payer: Medicare Other | Source: Ambulatory Visit | Attending: Nephrology | Admitting: Nephrology

## 2022-07-11 DIAGNOSIS — E1122 Type 2 diabetes mellitus with diabetic chronic kidney disease: Secondary | ICD-10-CM | POA: Insufficient documentation

## 2022-07-11 DIAGNOSIS — I129 Hypertensive chronic kidney disease with stage 1 through stage 4 chronic kidney disease, or unspecified chronic kidney disease: Secondary | ICD-10-CM | POA: Insufficient documentation

## 2022-08-15 ENCOUNTER — Ambulatory Visit (INDEPENDENT_AMBULATORY_CARE_PROVIDER_SITE_OTHER): Payer: Medicare Other | Admitting: Pharmacist Clinician (PhC)/ Clinical Pharmacy Specialist

## 2022-08-15 ENCOUNTER — Encounter (HOSPITAL_BASED_OUTPATIENT_CLINIC_OR_DEPARTMENT_OTHER): Payer: Self-pay | Admitting: Pharmacist Clinician (PhC)/ Clinical Pharmacy Specialist

## 2022-08-15 VITALS — Ht 73.0 in | Wt 191.0 lb

## 2022-08-15 DIAGNOSIS — E118 Type 2 diabetes mellitus with unspecified complications: Secondary | ICD-10-CM

## 2022-08-15 DIAGNOSIS — I25119 Atherosclerotic heart disease of native coronary artery with unspecified angina pectoris: Secondary | ICD-10-CM

## 2022-08-15 DIAGNOSIS — I10 Essential (primary) hypertension: Secondary | ICD-10-CM

## 2022-08-15 DIAGNOSIS — E782 Mixed hyperlipidemia: Secondary | ICD-10-CM

## 2022-08-15 NOTE — Assessment & Plan Note (Signed)
Patient with essential hypertension, controlled with benazepril and chlorthalidone.  Pt had recent spike in SCr and will evaluate with nephrology at an upcoming appointment.  No changes to medication today, will let them decide if need to reduce/hold either in light of recent changes.

## 2022-08-15 NOTE — Assessment & Plan Note (Signed)
Patient has had recent bump in A1c to 7.8.  He is unsure of cause other than it was drawn shortly after a long vacation and he suspects dietary indiscretion while travelling.  Currently on Jardiance 12.5 mg (gets from [place] - rather than 10 mg tabs they do 1/2 of 25 mg).  He will continue to work on dietary changes at home and would like to try this for another 2-3 months and repeat A1c before making any changes.

## 2022-08-15 NOTE — Progress Notes (Signed)
Office Visit    Patient Name: Kenneth Watkins. Date of Encounter: 08/15/2022  Primary Care Provider:  Lemmie Evens, MD Primary Cardiologist:  Rozann Lesches, MD  Chief Complaint    Medication management  Past Medical History   CAD DES to OM1, LCX and mid RCA  Hypertension On benazepril 20, chlorthalidone 25, verapamil 120  Hyperlipidemia 12/21 LDL 81on atorvastatin 80  AF (paroxysmal) CHADS2-VASc = 4; on Eliquis 5 mg and verapamil 120  DM2 10/23 A1c 7.8 on Jardiance 10, (was 6.6 in May)  CKD 10/23 Stage 3B SCr 1.67, GFR 43     Allergies  Allergen Reactions   Codeine Nausea And Vomiting   Farxiga [Dapagliflozin]     Weakness, light headed    History of Present Illness    Kenneth Watkins is a 76 year old male that I met earlier this year when I saw his wife for hyperlipidemia.  He was concerned about getting medications from multiple providers and felt that he was taking too much.  He also wanted to be sure that his medications would not interact negatively with each other and there were no duplications of therapy.   He has all his medication bottles with him in the office today.   Blood Pressure Goal:  130/80  Family Hx:   4 of 5 siblings with AF;  one ablation, no strokes, youngest brother gave up professional golf 2/2 blood clots, AF; one son, no heart issues (migraines)  Social Hx: no tobacco, no alcohol, caffeine free diet pepsi, some tea      Diet:  mostly home cooked, lots of chicken, fish, veggies fresh as able, occasional rice or potatoes  Exercise: walks daily, golf 3-4 times per week   Accessory Clinical Findings    Lab Results  Component Value Date   CREATININE 1.37 (H) 01/26/2022   BUN 29 (H) 01/26/2022   NA 136 01/26/2022   K 3.5 01/26/2022   CL 103 01/26/2022   CO2 26 01/26/2022   No results found for: "ALT", "AST", "GGT", "ALKPHOS", "BILITOT" Lab Results  Component Value Date   HGBA1C 6.6 (H) 01/26/2022    Home Medications    Current  Outpatient Medications  Medication Sig Dispense Refill   Accu-Chek Softclix Lancets lancets 1 each by Other route as needed.     acetaminophen (TYLENOL) 650 MG CR tablet Take 650 mg by mouth every 8 (eight) hours as needed for pain.     apixaban (ELIQUIS) 5 MG TABS tablet Take 5 mg by mouth 2 (two) times daily.     atorvastatin (LIPITOR) 80 MG tablet Take 1 tablet (80 mg total) by mouth daily. 90 tablet 3   B-D 3CC LUER-LOK SYR 22GX1" 22G X 1" 3 ML MISC 1 each by Other route as needed.     BD DISP NEEDLES 18G X 1-1/2" MISC 1 each by Other route as needed.     benazepril (LOTENSIN) 20 MG tablet Take 20 mg by mouth daily.     chlorthalidone (HYGROTON) 25 MG tablet Take 25 mg by mouth daily.     cholecalciferol (VITAMIN D3) 25 MCG (1000 UNIT) tablet Take 1,000 Units by mouth daily.     clotrimazole-betamethasone (LOTRISONE) cream Apply 1 application topically daily as needed (Rash).      Coenzyme Q10 (CO Q 10 PO) Take 1 capsule by mouth daily.     CONTOUR NEXT TEST test strip 1 each by Other route daily.     empagliflozin (JARDIANCE) 25 MG TABS  tablet Take 12.5 mg by mouth daily.     famotidine (PEPCID) 10 MG tablet Take 10 mg by mouth daily as needed for heartburn or indigestion.     Misc Natural Products (MENS PROSTATE HEALTH FORMULA PO) Take 1 tablet by mouth in the morning and at bedtime. Prostate Formula: Real Health     Misc Natural Products (OSTEO BI-FLEX TRIPLE STRENGTH PO) Take 1 tablet by mouth in the morning and at bedtime.     Multiple Vitamins-Minerals (MULTIVITAMIN WITH MINERALS) tablet Take 1 tablet by mouth daily. Men's     nitroGLYCERIN (NITROSTAT) 0.4 MG SL tablet Place 1 tablet (0.4 mg total) under the tongue every 5 (five) minutes as needed for chest pain. 25 tablet 1   tadalafil (CIALIS) 10 MG tablet Take 1 tablet by mouth as needed for erectile dysfunction.     testosterone cypionate (DEPOTESTOSTERONE CYPIONATE) 200 MG/ML injection Inject 200 mg into the muscle every 14  (fourteen) days.      verapamil (CALAN-SR) 120 MG CR tablet TAKE 1 TABLET BY MOUTH AT BEDTIME. 90 tablet 3   No current facility-administered medications for this visit.     Assessment & Plan    Patient was in office today to review medications.  All medications (OTC and Rx) were reviewed.  No drug interactions noted and all were explained as to purpose.    Hypertension Patient with essential hypertension, controlled with benazepril and chlorthalidone.  Pt had recent spike in SCr and will evaluate with nephrology at an upcoming appointment.  No changes to medication today, will let them decide if need to reduce/hold either in light of recent changes.   Type 2 diabetes mellitus with complication, without long-term current use of insulin (HCC) Patient has had recent bump in A1c to 7.8.  He is unsure of cause other than it was drawn shortly after a long vacation and he suspects dietary indiscretion while travelling.  Currently on Jardiance 12.5 mg (gets from [place] - rather than 10 mg tabs they do 1/2 of 25 mg).  He will continue to work on dietary changes at home and would like to try this for another 2-3 months and repeat A1c before making any changes.  Hyperlipidemia History of CAD with 3 DES.  Currently on atorvastatin 80, no recent labs drawn.  Patient also taking CoQ10 to prevent myalgias, and has tolerated statin so far without incidence.     Kenneth Watkins PharmD CPP [facility]  [address] [place], [place] [address] [phone]

## 2022-08-15 NOTE — Assessment & Plan Note (Signed)
History of CAD with 3 DES.  Currently on atorvastatin 80, no recent labs drawn.  Patient also taking CoQ10 to prevent myalgias, and has tolerated statin so far without incidence.

## 2022-08-15 NOTE — Patient Instructions (Signed)
[place] CARE  Check your blood pressure at home daily (if able) and keep record of the readings.  Take your meds as follows:  Continue with your current medications  Bring all of your meds, your BP cuff and your record of home blood pressures to your next appointment.  Exercise as you're able, try to walk approximately 30 minutes per day.  Keep salt intake to a minimum, especially watch canned and prepared boxed foods.  Eat more fresh fruits and vegetables and fewer canned items.  Avoid eating in fast food restaurants.    HOW TO TAKE YOUR BLOOD PRESSURE: Rest 5 minutes before taking your blood pressure.  Don't smoke or drink caffeinated beverages for at least 30 minutes before. Take your blood pressure before (not after) you eat. Sit comfortably with your back supported and both feet on the floor (don't cross your legs). Elevate your arm to heart level on a table or a desk. Use the proper sized cuff. It should fit smoothly and snugly around your bare upper arm. There should be enough room to slip a fingertip under the cuff. The bottom edge of the cuff should be 1 inch above the crease of the elbow. Ideally, take 3 measurements at one sitting and record the average.

## 2022-08-17 DIAGNOSIS — G8929 Other chronic pain: Secondary | ICD-10-CM | POA: Insufficient documentation

## 2022-08-29 DIAGNOSIS — M533 Sacrococcygeal disorders, not elsewhere classified: Secondary | ICD-10-CM | POA: Insufficient documentation

## 2022-09-04 ENCOUNTER — Ambulatory Visit (INDEPENDENT_AMBULATORY_CARE_PROVIDER_SITE_OTHER): Payer: Medicare Other | Admitting: Urology

## 2022-09-04 ENCOUNTER — Encounter: Payer: Self-pay | Admitting: Urology

## 2022-09-04 VITALS — BP 148/73 | HR 80 | Ht 73.0 in | Wt 191.0 lb

## 2022-09-04 DIAGNOSIS — N5201 Erectile dysfunction due to arterial insufficiency: Secondary | ICD-10-CM | POA: Diagnosis not present

## 2022-09-04 DIAGNOSIS — N401 Enlarged prostate with lower urinary tract symptoms: Secondary | ICD-10-CM

## 2022-09-04 DIAGNOSIS — I25119 Atherosclerotic heart disease of native coronary artery with unspecified angina pectoris: Secondary | ICD-10-CM | POA: Diagnosis not present

## 2022-09-04 DIAGNOSIS — N138 Other obstructive and reflux uropathy: Secondary | ICD-10-CM | POA: Diagnosis not present

## 2022-09-04 DIAGNOSIS — R339 Retention of urine, unspecified: Secondary | ICD-10-CM

## 2022-09-04 LAB — BLADDER SCAN AMB NON-IMAGING: Scan Result: 151

## 2022-09-04 MED ORDER — ALFUZOSIN HCL ER 10 MG PO TB24: 10.0000 mg | ORAL_TABLET | Freq: Every day | ORAL | 11 refills | Status: DC

## 2022-09-04 NOTE — Patient Instructions (Signed)

## 2022-09-04 NOTE — Progress Notes (Signed)
09/04/2022 2:53 PM   Kenneth Watkins. [birth year] [id number]  Referring provider: Lemmie Evens, MD [place],  [place] [address]  Incomplete emptying   HPI: Kenneth Watkins is a 76yo here for evaluation of incomplete emptying. He was previously seen for urinary frequency likely related to prostatitis and farxiga. He is now on jardiance and does not have the urinary frequency,.For the past 3 months he has noted a feeling of incomplete emptying.  Nocturia 2x. Urine stream is fair. IPSS 6 QOL 3. He was recently started on finasteride   PMH: Past Medical History:  Diagnosis Date   BPH (benign prostatic hyperplasia)    CAD (coronary artery disease)    a. s/p DES to OM1 and DES to RCA in 02/2020   CKD (chronic kidney disease) stage 3, GFR 30-59 ml/min (HCC)    Hyperlipidemia    Hypertension    Persistent atrial fibrillation ([place])    Pinched nerve in neck    Type 2 diabetes mellitus ([place])     Surgical History: Past Surgical History:  Procedure Laterality Date   COLONOSCOPY WITH PROPOFOL N/A 04/21/2021   Procedure: COLONOSCOPY WITH PROPOFOL;  Surgeon: Harvel Quale, MD;  [facility];  Service: Gastroenterology;  Laterality: N/A;  7:30   CORONARY STENT INTERVENTION N/A 02/24/2020   Procedure: CORONARY STENT INTERVENTION;  Surgeon: Troy Sine, MD;  Location: [facility];  Service: Cardiovascular;  Laterality: N/A;   EYE SURGERY     INGUINAL HERNIA REPAIR Right 01/29/2022   Procedure: HERNIA REPAIR INGUINAL ADULT WITH MESH;  Surgeon: Virl Cagey, MD;  Location: [facility];  Service: General;  Laterality: Right;   LEFT HEART CATH AND CORONARY ANGIOGRAPHY N/A 02/23/2020   Procedure: LEFT HEART CATH AND CORONARY ANGIOGRAPHY;  Surgeon: Troy Sine, MD;  Location: [facility];  Service: Cardiovascular;  Laterality: N/A;   TONSILLECTOMY     VASECTOMY      Home Medications:  Allergies as of 09/04/2022       Reactions   Codeine Nausea  And Vomiting   Farxiga [dapagliflozin]    Weakness, light headed        Medication List        Accurate as of September 04, 2022  2:53 PM. If you have any questions, ask your nurse or doctor.          Accu-Chek Softclix Lancets lancets 1 each by Other route as needed.   acetaminophen 650 MG CR tablet Commonly known as: TYLENOL Take 650 mg by mouth every 8 (eight) hours as needed for pain.   apixaban 5 MG Tabs tablet Commonly known as: ELIQUIS Take 5 mg by mouth 2 (two) times daily.   atorvastatin 80 MG tablet Commonly known as: LIPITOR Take 1 tablet (80 mg total) by mouth daily.   B-D 3CC LUER-LOK SYR 22GX1" 22G X 1" 3 ML Misc Generic drug: SYRINGE-NEEDLE (DISP) 3 ML 1 each by Other route as needed.   BD Disp Needles 18G X 1-1/2" Misc Generic drug: NEEDLE (DISP) 18 G 1 each by Other route as needed.   benazepril 20 MG tablet Commonly known as: LOTENSIN Take 20 mg by mouth daily.   chlorthalidone 25 MG tablet Commonly known as: HYGROTON Take 25 mg by mouth daily.   cholecalciferol 25 MCG (1000 UNIT) tablet Commonly known as: VITAMIN D3 Take 1,000 Units by mouth daily.   clotrimazole-betamethasone cream Commonly known as: LOTRISONE Apply 1 application topically daily as  needed (Rash).   CO Q 10 PO Take 1 capsule by mouth daily.   Contour Next Test test strip Generic drug: glucose blood 1 each by Other route daily.   famotidine 10 MG tablet Commonly known as: PEPCID Take 10 mg by mouth daily as needed for heartburn or indigestion.   Jardiance 25 MG Tabs tablet Generic drug: empagliflozin Take 12.5 mg by mouth daily.   MENS PROSTATE HEALTH FORMULA PO Take 1 tablet by mouth in the morning and at bedtime. Prostate Formula: Real Health   OSTEO BI-FLEX TRIPLE STRENGTH PO Take 1 tablet by mouth in the morning and at bedtime.   multivitamin with minerals tablet Take 1 tablet by mouth daily. Men's   nitroGLYCERIN 0.4 MG SL tablet Commonly known as:  Nitrostat Place 1 tablet (0.4 mg total) under the tongue every 5 (five) minutes as needed for chest pain.   tadalafil 10 MG tablet Commonly known as: CIALIS Take 1 tablet by mouth as needed for erectile dysfunction.   testosterone cypionate 200 MG/ML injection Commonly known as: DEPOTESTOSTERONE CYPIONATE Inject 200 mg into the muscle every 14 (fourteen) days.   verapamil 120 MG CR tablet Commonly known as: CALAN-SR TAKE 1 TABLET BY MOUTH AT BEDTIME.        Allergies:  Allergies  Allergen Reactions   Codeine Nausea And Vomiting   Farxiga [Dapagliflozin]     Weakness, light headed    Family History: Family History  Problem Relation Age of Onset   Uterine cancer Mother    Diabetes Mother    Stroke Mother    Heart attack Father    Stroke Father     Social History:  reports that he has never smoked. He has never used smokeless tobacco. He reports that he does not drink alcohol and does not use drugs.  ROS: All other review of systems were reviewed and are negative except what is noted above in HPI  Physical Exam: BP (!) 148/73   Pulse 80   Ht '6\' 1"'$  (1.854 m)   Wt 191 lb (86.6 kg)   BMI 25.20 kg/m   Constitutional:  Alert and oriented, No acute distress. HEENT: [place] AT, moist mucus membranes.  Trachea midline, no masses. Cardiovascular: No clubbing, cyanosis, or edema. Respiratory: Normal respiratory effort, no increased work of breathing. GI: Abdomen is soft, nontender, nondistended, no abdominal masses GU: No CVA tenderness.  Lymph: No cervical or inguinal lymphadenopathy. Skin: No rashes, bruises or suspicious lesions. Neurologic: Grossly intact, no focal deficits, moving all 4 extremities. Psychiatric: Normal mood and affect.  Laboratory Data: Lab Results  Component Value Date   WBC 6.2 03/29/2021   HGB 15.2 03/29/2021   HCT 44.6 03/29/2021   MCV 89.6 03/29/2021   PLT 191 03/29/2021    Lab Results  Component Value Date   CREATININE 1.37 (H)  01/26/2022    No results found for: "PSA"  No results found for: "TESTOSTERONE"  Lab Results  Component Value Date   HGBA1C 6.6 (H) 01/26/2022    Urinalysis No results found for: "COLORURINE", "APPEARANCEUR", "LABSPEC", "PHURINE", "GLUCOSEU", "HGBUR", "BILIRUBINUR", "KETONESUR", "PROTEINUR", "UROBILINOGEN", "NITRITE", "LEUKOCYTESUR"  No results found for: "LABMICR", "WBCUA", "RBCUA", "LABEPIT", "MUCUS", "BACTERIA"  Pertinent Imaging:  No results found for this or any previous visit.  No results found for this or any previous visit.  No results found for this or any previous visit.  No results found for this or any previous visit.  Results for orders placed during the hospital encounter of  07/11/22  US RENAL  Narrative CLINICAL DATA:  Chronic kidney disease due to type II diabetes mellitus, hypertension  EXAM: RENAL / URINARY TRACT ULTRASOUND COMPLETE  COMPARISON:  None Available.  FINDINGS: Right Kidney:  Renal measurements: 11.3 x 5.4 x 4.8 cm = volume: 153 mL. Normal cortical thickness with upper normal cortical echogenicity. No mass, hydronephrosis, or shadowing calcification.  Left Kidney:  Renal measurements: 11.6 x 6.5 x 5.6 cm = volume: 217 mL. Normal cortical thickness. Upper normal cortical echogenicity. No mass, hydronephrosis, or shadowing calcification.  Bladder:  Appears normal for degree of bladder distention. Calculated prevoid volume 658 mL with a large postvoid residual volume of 313 mL.  Other:  Enlarged prostate gland, 5.0 x 4.7 x 4.6 cm, 56 mL.  IMPRESSION: Large postvoid residual volume within urinary bladder.  Prostatic enlargement.  No renal sonographic abnormalities.   Electronically Signed By: Lavonia Dana M.D. On: 07/12/2022 18:40  No valid procedures specified. No results found for this or any previous visit.  No results found for this or any previous visit.   Assessment & Plan:    1. Incomplete emptying of  bladder -We will trial uroxatral '10mg'$  qhs - Urinalysis, Routine w reflex microscopic - BLADDER SCAN AMB NON-IMAGING  2. Benign prostatic hyperplasia with urinary obstruction -uroxatral '10mg'$  qhs and continue finasteride  3. Erectile dysfunction due to arterial insufficiency -tadalafil '10mg'$  prn   No follow-ups on file.  Nicolette Bang, MD  [facility] [place]

## 2022-09-04 NOTE — Progress Notes (Signed)
post void residual =168m

## 2022-09-05 LAB — URINALYSIS, ROUTINE W REFLEX MICROSCOPIC
Bilirubin, UA: NEGATIVE
Ketones, UA: NEGATIVE
Leukocytes,UA: NEGATIVE
Nitrite, UA: NEGATIVE
Protein,UA: NEGATIVE
Specific Gravity, UA: <1.005 — ABNORMAL LOW (ref 1.005–1.030)
Urobilinogen, Ur: 0.2 mg/dL (ref 0.2–1.0)
pH, UA: 5.5 (ref 5.0–7.5)

## 2022-09-05 LAB — MICROSCOPIC EXAMINATION: Bacteria, UA: NONE SEEN

## 2022-09-30 ENCOUNTER — Encounter: Payer: Self-pay | Admitting: Urology

## 2022-10-01 NOTE — Progress Notes (Unsigned)
Cardiology Office Note  Date: 10/02/2022   ID: Kenneth Watkins., DOB 10-04-1945, MRN [id number]  PCP:  Lemmie Evens, MD  Cardiologist:  Rozann Lesches, MD Electrophysiologist:  None   Chief Complaint  Patient presents with   Cardiac follow-up    History of Present Illness: Kenneth Watkins. is a 77 y.o. male last assessed in July 2023.  He is here for a follow-up visit.  Reports no palpitations or atrial fibrillation alerts from his Apple Watch since last encounter.  No change in present cardiac regimen.  He does not describe any spontaneous bleeding problems on Eliquis.  I reviewed his interval lab work.  He continues to follow with both urology and nephrology, recent medication adjustments made from that perspective.  I personally reviewed his ECG which shows sinus rhythm with prolonged PR interval.  Past Medical History:  Diagnosis Date   BPH (benign prostatic hyperplasia)    CAD (coronary artery disease)    a. s/p DES to OM1 and DES to RCA in 02/2020   CKD (chronic kidney disease) stage 3, GFR 30-59 ml/min (HCC)    Hyperlipidemia    Hypertension    Persistent atrial fibrillation (HCC)    Pinched nerve in neck    Type 2 diabetes mellitus (HCC)     Current Outpatient Medications  Medication Sig Dispense Refill   Accu-Chek Softclix Lancets lancets 1 each by Other route as needed.     acetaminophen (TYLENOL) 650 MG CR tablet Take 650 mg by mouth every 8 (eight) hours as needed for pain.     apixaban (ELIQUIS) 5 MG TABS tablet Take 5 mg by mouth 2 (two) times daily.     atorvastatin (LIPITOR) 80 MG tablet Take 1 tablet (80 mg total) by mouth daily. 90 tablet 3   B-D 3CC LUER-LOK SYR 22GX1" 22G X 1" 3 ML MISC 1 each by Other route as needed.     BD DISP NEEDLES 18G X 1-1/2" MISC 1 each by Other route as needed.     benazepril (LOTENSIN) 20 MG tablet Take 20 mg by mouth daily.     chlorthalidone (HYGROTON) 25 MG tablet Take 25 mg by mouth daily.     cholecalciferol  (VITAMIN D3) 25 MCG (1000 UNIT) tablet Take 1,000 Units by mouth daily.     clotrimazole-betamethasone (LOTRISONE) cream Apply 1 application topically daily as needed (Rash).      Coenzyme Q10 (CO Q 10 PO) Take 1 capsule by mouth daily.     CONTOUR NEXT TEST test strip 1 each by Other route daily.     dutasteride (AVODART) 0.5 MG capsule Take 1 capsule (0.5 mg total) by mouth daily. 30 capsule 11   empagliflozin (JARDIANCE) 25 MG TABS tablet Take 12.5 mg by mouth daily.     famotidine (PEPCID) 10 MG tablet Take 10 mg by mouth daily as needed for heartburn or indigestion.     Misc Natural Products (MENS PROSTATE HEALTH FORMULA PO) Take 1 tablet by mouth in the morning and at bedtime. Prostate Formula: Real Health     Misc Natural Products (OSTEO BI-FLEX TRIPLE STRENGTH PO) Take 1 tablet by mouth in the morning and at bedtime.     Multiple Vitamins-Minerals (MULTIVITAMIN WITH MINERALS) tablet Take 1 tablet by mouth daily. Men's     nitroGLYCERIN (NITROSTAT) 0.4 MG SL tablet Place 1 tablet (0.4 mg total) under the tongue every 5 (five) minutes as needed for chest pain. 25 tablet 1   silodosin (RAPAFLO)  8 MG CAPS capsule Take 1 capsule (8 mg total) by mouth daily with breakfast. 30 capsule 11   tadalafil (CIALIS) 10 MG tablet Take 1 tablet by mouth as needed for erectile dysfunction.     testosterone cypionate (DEPOTESTOSTERONE CYPIONATE) 200 MG/ML injection Inject 200 mg into the muscle every 21 ( twenty-one) days.     verapamil (CALAN-SR) 120 MG CR tablet TAKE 1 TABLET BY MOUTH AT BEDTIME. 90 tablet 3   No current facility-administered medications for this visit.   Allergies:  Codeine and Farxiga [dapagliflozin]   ROS: No dizziness or syncope.  Physical Exam: VS:  BP (!) 122/58   Pulse 75   Ht '6\' 1"'$  (1.854 m)   Wt 188 lb 6.4 oz (85.5 kg)   SpO2 99%   BMI 24.86 kg/m , BMI Body mass index is 24.86 kg/m.  Wt Readings from Last 3 Encounters:  10/02/22 188 lb 6.4 oz (85.5 kg)  09/04/22 191  lb (86.6 kg)  08/15/22 191 lb (86.6 kg)    General: Patient appears comfortable at rest. HEENT: Conjunctiva and lids normal. Neck: Supple, no elevated JVP or carotid bruits.. Lungs: Clear to auscultation, nonlabored breathing at rest. Cardiac: Regular rate and rhythm, no S3 or significant systolic murmur. Extremities: No pitting edema.  ECG:  An ECG dated 06/28/2021 was personally reviewed today and demonstrated:  Sinus rhythm with prolonged PR interval.  Recent Labwork: October 2022: Cholesterol 131, triglycerides 147, HDL 39, LDL 69 01/26/2022: BUN 29; Creatinine, Ser 1.37; Potassium 3.5; Sodium 136     Component Value Date/Time   CHOL 148 08/17/2020 1030   TRIG 206 (H) 08/17/2020 1030   HDL 37 (L) 08/17/2020 1030   CHOLHDL 4.0 08/17/2020 1030   VLDL 29 02/24/2020 0411   LDLCALC 81 08/17/2020 1030  October 2023: Hemoglobin 17.7, platelets 207, BUN 24, creatinine 1.67, potassium 3.6, AST 23, ALT 39  Other Studies Reviewed Today:  No interval cardiac testing for review today.  Assessment and Plan:  1.  CAD status post DES to the OM1 and DES to the mid RCA in June 2021.  He reports no angina at this time, no interval nitroglycerin use.  Continue observation on Lipitor and Jardiance.  2.  Paroxysmal atrial fibrillation with CHA2DS2-VASc score of 4.  He continues on Eliquis for stroke prophylaxis, Calan SR 120 mg daily.  No breakthrough palpitations noted, no alerts from his Apple Watch.  Continue with current plan.  I did review his interval lab work.  3.  CKD stage IIIb, following with Dr. Theador Hawthorne.  Medication Adjustments/Labs and Tests Ordered: Current medicines are reviewed at length with the patient today.  Concerns regarding medicines are outlined above.   Tests Ordered: Orders Placed This Encounter  Procedures   EKG 12-Lead    Medication Changes: No orders of the defined types were placed in this encounter.   Disposition:  Follow up  6 months.  Signed, Satira Sark, MD, [facility] 10/02/2022 11:52 AM    [facility], [place], [place] [address] Phone: [phone]; Fax: [phone]

## 2022-10-02 ENCOUNTER — Ambulatory Visit: Payer: Medicare Other | Attending: Cardiology | Admitting: Cardiology

## 2022-10-02 ENCOUNTER — Encounter: Payer: Self-pay | Admitting: Cardiology

## 2022-10-02 VITALS — BP 122/58 | HR 75 | Ht 73.0 in | Wt 188.4 lb

## 2022-10-02 DIAGNOSIS — I480 Paroxysmal atrial fibrillation: Secondary | ICD-10-CM | POA: Insufficient documentation

## 2022-10-02 DIAGNOSIS — I25119 Atherosclerotic heart disease of native coronary artery with unspecified angina pectoris: Secondary | ICD-10-CM | POA: Diagnosis not present

## 2022-10-02 MED ORDER — SILODOSIN 8 MG PO CAPS: 8.0000 mg | ORAL_CAPSULE | Freq: Every day | ORAL | 11 refills | Status: DC

## 2022-10-02 MED ORDER — DUTASTERIDE 0.5 MG PO CAPS: 0.5000 mg | ORAL_CAPSULE | Freq: Every day | ORAL | 11 refills | Status: DC

## 2022-10-02 NOTE — Patient Instructions (Signed)

## 2022-10-09 ENCOUNTER — Other Ambulatory Visit: Payer: Self-pay

## 2022-10-11 ENCOUNTER — Other Ambulatory Visit: Payer: Self-pay

## 2022-10-11 DIAGNOSIS — N401 Enlarged prostate with lower urinary tract symptoms: Secondary | ICD-10-CM

## 2022-10-11 MED ORDER — SILODOSIN 8 MG PO CAPS: 8.0000 mg | ORAL_CAPSULE | Freq: Every day | ORAL | 11 refills | Status: DC

## 2022-10-11 MED ORDER — DUTASTERIDE 0.5 MG PO CAPS: 0.5000 mg | ORAL_CAPSULE | Freq: Every day | ORAL | 11 refills | Status: DC

## 2022-11-29 LAB — HEMOGLOBIN A1C: A1c: 7.8

## 2022-12-12 ENCOUNTER — Ambulatory Visit (INDEPENDENT_AMBULATORY_CARE_PROVIDER_SITE_OTHER): Payer: Medicare Other | Admitting: Urology

## 2022-12-12 VITALS — BP 136/68 | HR 72

## 2022-12-12 DIAGNOSIS — N138 Other obstructive and reflux uropathy: Secondary | ICD-10-CM

## 2022-12-12 DIAGNOSIS — R339 Retention of urine, unspecified: Secondary | ICD-10-CM

## 2022-12-12 DIAGNOSIS — N5201 Erectile dysfunction due to arterial insufficiency: Secondary | ICD-10-CM

## 2022-12-12 DIAGNOSIS — N401 Enlarged prostate with lower urinary tract symptoms: Secondary | ICD-10-CM | POA: Diagnosis not present

## 2022-12-12 LAB — URINALYSIS, ROUTINE W REFLEX MICROSCOPIC
Bilirubin, UA: NEGATIVE
Ketones, UA: NEGATIVE
Leukocytes,UA: NEGATIVE
Nitrite, UA: NEGATIVE
Protein,UA: NEGATIVE
RBC, UA: NEGATIVE
Specific Gravity, UA: 1.01 (ref 1.005–1.030)
Urobilinogen, Ur: 0.2 mg/dL (ref 0.2–1.0)
pH, UA: 6.5 (ref 5.0–7.5)

## 2022-12-12 LAB — BLADDER SCAN AMB NON-IMAGING: Scan Result: 175

## 2022-12-12 MED ORDER — FINASTERIDE 5 MG PO TABS: 5.0000 mg | ORAL_TABLET | Freq: Every day | ORAL | 3 refills | Status: DC

## 2022-12-12 MED ORDER — ALFUZOSIN HCL ER 10 MG PO TB24: 10.0000 mg | ORAL_TABLET | Freq: Every day | ORAL | 11 refills | Status: DC

## 2022-12-12 NOTE — Progress Notes (Unsigned)
12/12/2022 1:42 PM   [place]. February 29, 1946 [id number]  Referring provider: Lemmie Evens, MD [place],  [place] [address]  No chief complaint on file.   HPI: IPSS 2 QOl 0 on uroxtral 10mg  and finasteride. He uses tadalafil 20mg  prn   PMH: Past Medical History:  Diagnosis Date   BPH (benign prostatic hyperplasia)    CAD (coronary artery disease)    a. s/p DES to OM1 and DES to RCA in 02/2020   CKD (chronic kidney disease) stage 3, GFR 30-59 ml/min (HCC)    Hyperlipidemia    Hypertension    Persistent atrial fibrillation ([place])    Pinched nerve in neck    Type 2 diabetes mellitus ([place])     Surgical History: Past Surgical History:  Procedure Laterality Date   COLONOSCOPY WITH PROPOFOL N/A 04/21/2021   Procedure: COLONOSCOPY WITH PROPOFOL;  Surgeon: Harvel Quale, MD;  [facility];  Service: Gastroenterology;  Laterality: N/A;  7:30   CORONARY STENT INTERVENTION N/A 02/24/2020   Procedure: CORONARY STENT INTERVENTION;  Surgeon: Troy Sine, MD;  Location: [place] [facility];  Service: Cardiovascular;  Laterality: N/A;   EYE SURGERY     INGUINAL HERNIA REPAIR Right 01/29/2022   Procedure: HERNIA REPAIR INGUINAL ADULT WITH MESH;  Surgeon: Virl Cagey, MD;  Location: [facility];  Service: General;  Laterality: Right;   LEFT HEART CATH AND CORONARY ANGIOGRAPHY N/A 02/23/2020   Procedure: LEFT HEART CATH AND CORONARY ANGIOGRAPHY;  Surgeon: Troy Sine, MD;  Location: [place] [facility];  Service: Cardiovascular;  Laterality: N/A;   TONSILLECTOMY     VASECTOMY      Home Medications:  Allergies as of 12/12/2022       Reactions   Codeine Nausea And Vomiting   Farxiga [dapagliflozin]    Weakness, light headed        Medication List        Accurate as of December 12, 2022  1:42 PM. If you have any questions, ask your nurse or doctor.          Accu-Chek Softclix Lancets lancets 1 each by Other route as needed.    acetaminophen 650 MG CR tablet Commonly known as: TYLENOL Take 650 mg by mouth every 8 (eight) hours as needed for pain.   apixaban 5 MG Tabs tablet Commonly known as: ELIQUIS Take 5 mg by mouth 2 (two) times daily.   atorvastatin 80 MG tablet Commonly known as: LIPITOR Take 1 tablet (80 mg total) by mouth daily.   B-D 3CC LUER-LOK SYR 22GX1" 22G X 1" 3 ML Misc Generic drug: SYRINGE-NEEDLE (DISP) 3 ML 1 each by Other route as needed.   BD Disp Needles 18G X 1-1/2" Misc Generic drug: NEEDLE (DISP) 18 G 1 each by Other route as needed.   benazepril 20 MG tablet Commonly known as: LOTENSIN Take 20 mg by mouth daily.   chlorthalidone 25 MG tablet Commonly known as: HYGROTON Take 25 mg by mouth daily.   cholecalciferol 25 MCG (1000 UT) tablet Take 1,000 Units by mouth daily.   clotrimazole-betamethasone cream Commonly known as: LOTRISONE Apply 1 application topically daily as needed (Rash).   CO Q 10 PO Take 1 capsule by mouth daily.   Contour Next Test test strip Generic drug: glucose blood 1 each by Other route daily.   dutasteride 0.5 MG capsule Commonly known as: AVODART Take 1 capsule (0.5 mg total) by mouth daily.   famotidine  10 MG tablet Commonly known as: PEPCID Take 10 mg by mouth daily as needed for heartburn or indigestion.   Jardiance 25 MG Tabs tablet Generic drug: empagliflozin Take 12.5 mg by mouth daily.   MENS PROSTATE HEALTH FORMULA PO Take 1 tablet by mouth in the morning and at bedtime. Prostate Formula: Real Health   OSTEO BI-FLEX TRIPLE STRENGTH PO Take 1 tablet by mouth in the morning and at bedtime.   multivitamin with minerals tablet Take 1 tablet by mouth daily. Men's   nitroGLYCERIN 0.4 MG SL tablet Commonly known as: Nitrostat Place 1 tablet (0.4 mg total) under the tongue every 5 (five) minutes as needed for chest pain.   silodosin 8 MG Caps capsule Commonly known as: RAPAFLO Take 1 capsule (8 mg total) by mouth daily  with breakfast.   tadalafil 10 MG tablet Commonly known as: CIALIS Take 1 tablet by mouth as needed for erectile dysfunction.   testosterone cypionate 200 MG/ML injection Commonly known as: DEPOTESTOSTERONE CYPIONATE Inject 200 mg into the muscle every 21 ( twenty-one) days.   verapamil 120 MG CR tablet Commonly known as: CALAN-SR TAKE 1 TABLET BY MOUTH AT BEDTIME.        Allergies:  Allergies  Allergen Reactions   Codeine Nausea And Vomiting   Farxiga [Dapagliflozin]     Weakness, light headed    Family History: Family History  Problem Relation Age of Onset   Uterine cancer Mother    Diabetes Mother    Stroke Mother    Heart attack Father    Stroke Father     Social History:  reports that he has never smoked. He has never used smokeless tobacco. He reports that he does not drink alcohol and does not use drugs.  ROS: All other review of systems were reviewed and are negative except what is noted above in HPI  Physical Exam: BP 136/68   Pulse 72   Constitutional:  Alert and oriented, No acute distress. HEENT: [place] AT, moist mucus membranes.  Trachea midline, no masses. Cardiovascular: No clubbing, cyanosis, or edema. Respiratory: Normal respiratory effort, no increased work of breathing. GI: Abdomen is soft, nontender, nondistended, no abdominal masses GU: No CVA tenderness.  Lymph: No cervical or inguinal lymphadenopathy. Skin: No rashes, bruises or suspicious lesions. Neurologic: Grossly intact, no focal deficits, moving all 4 extremities. Psychiatric: Normal mood and affect.  Laboratory Data: Lab Results  Component Value Date   WBC 6.2 03/29/2021   HGB 15.2 03/29/2021   HCT 44.6 03/29/2021   MCV 89.6 03/29/2021   PLT 191 03/29/2021    Lab Results  Component Value Date   CREATININE 1.37 (H) 01/26/2022    No results found for: "PSA"  No results found for: "TESTOSTERONE"  Lab Results  Component Value Date   HGBA1C 6.6 (H) 01/26/2022     Urinalysis    Component Value Date/Time   APPEARANCEUR Clear 09/04/2022 1449   GLUCOSEU 3+ (A) 09/04/2022 1449   BILIRUBINUR Negative 09/04/2022 1449   PROTEINUR Negative 09/04/2022 1449   NITRITE Negative 09/04/2022 1449   LEUKOCYTESUR Negative 09/04/2022 1449    Lab Results  Component Value Date   LABMICR See below: 09/04/2022   WBCUA 0-5 09/04/2022   LABEPIT 0-10 09/04/2022   BACTERIA None seen 09/04/2022    Pertinent Imaging: *** No results found for this or any previous visit.  No results found for this or any previous visit.  No results found for this or any previous visit.  No  results found for this or any previous visit.  Results for orders placed during the hospital encounter of 07/11/22  US RENAL  Narrative CLINICAL DATA:  Chronic kidney disease due to type II diabetes mellitus, hypertension  EXAM: RENAL / URINARY TRACT ULTRASOUND COMPLETE  COMPARISON:  None Available.  FINDINGS: Right Kidney:  Renal measurements: 11.3 x 5.4 x 4.8 cm = volume: 153 mL. Normal cortical thickness with upper normal cortical echogenicity. No mass, hydronephrosis, or shadowing calcification.  Left Kidney:  Renal measurements: 11.6 x 6.5 x 5.6 cm = volume: 217 mL. Normal cortical thickness. Upper normal cortical echogenicity. No mass, hydronephrosis, or shadowing calcification.  Bladder:  Appears normal for degree of bladder distention. Calculated prevoid volume 658 mL with a large postvoid residual volume of 313 mL.  Other:  Enlarged prostate gland, 5.0 x 4.7 x 4.6 cm, 56 mL.  IMPRESSION: Large postvoid residual volume within urinary bladder.  Prostatic enlargement.  No renal sonographic abnormalities.   Electronically Signed By: Lavonia Dana M.D. On: 07/12/2022 18:40  No valid procedures specified. No results found for this or any previous visit.  No results found for this or any previous visit.   Assessment & Plan:    1. Benign prostatic  hyperplasia with urinary obstruction - - Urinalysis, Routine w reflex microscopic  2. Incomplete emptying of bladder -continue uroxatral 10mg  and finasteride 5mg   - BLADDER SCAN AMB NON-IMAGING  3. Erectile dysfunction due to arterial insufficiency -continue tadaladil 20mg  and we will send in VED for patient   No follow-ups on file.  Nicolette Bang, MD  [facility] [place]

## 2022-12-12 NOTE — Patient Instructions (Signed)

## 2022-12-12 NOTE — Progress Notes (Unsigned)
post void residual=175

## 2022-12-13 ENCOUNTER — Encounter: Payer: Self-pay | Admitting: Urology

## 2022-12-28 ENCOUNTER — Encounter: Payer: Self-pay | Admitting: Urology

## 2023-02-03 ENCOUNTER — Other Ambulatory Visit: Payer: Self-pay | Admitting: Cardiology

## 2023-02-24 ENCOUNTER — Encounter: Payer: Self-pay | Admitting: Urology

## 2023-02-26 MED ORDER — DUTASTERIDE 0.5 MG PO CAPS: 0.5000 mg | ORAL_CAPSULE | Freq: Every day | ORAL | 11 refills | Status: DC

## 2023-02-26 MED ORDER — TAMSULOSIN HCL 0.4 MG PO CAPS: 0.4000 mg | ORAL_CAPSULE | Freq: Every day | ORAL | 11 refills | Status: DC

## 2023-03-04 ENCOUNTER — Ambulatory Visit (INDEPENDENT_AMBULATORY_CARE_PROVIDER_SITE_OTHER): Payer: Medicare Other | Admitting: Internal Medicine

## 2023-03-04 ENCOUNTER — Encounter: Payer: Self-pay | Admitting: Internal Medicine

## 2023-03-04 VITALS — BP 116/71 | HR 66 | Ht 73.0 in | Wt 182.4 lb

## 2023-03-04 DIAGNOSIS — I2510 Atherosclerotic heart disease of native coronary artery without angina pectoris: Secondary | ICD-10-CM | POA: Diagnosis not present

## 2023-03-04 DIAGNOSIS — B356 Tinea cruris: Secondary | ICD-10-CM

## 2023-03-04 DIAGNOSIS — E118 Type 2 diabetes mellitus with unspecified complications: Secondary | ICD-10-CM

## 2023-03-04 DIAGNOSIS — N182 Chronic kidney disease, stage 2 (mild): Secondary | ICD-10-CM

## 2023-03-04 DIAGNOSIS — E291 Testicular hypofunction: Secondary | ICD-10-CM

## 2023-03-04 DIAGNOSIS — I480 Paroxysmal atrial fibrillation: Secondary | ICD-10-CM | POA: Diagnosis not present

## 2023-03-04 DIAGNOSIS — E782 Mixed hyperlipidemia: Secondary | ICD-10-CM

## 2023-03-04 DIAGNOSIS — I10 Essential (primary) hypertension: Secondary | ICD-10-CM

## 2023-03-04 DIAGNOSIS — N401 Enlarged prostate with lower urinary tract symptoms: Secondary | ICD-10-CM

## 2023-03-04 NOTE — Patient Instructions (Signed)
It was a pleasure to see you today.  Thank you for giving [place] the opportunity to be involved in your care.  Below is a brief recap of your visit and next steps.  We will plan to see you again in 3 months.  Summary You have established care today. We will check labs. No medication changes have been made. Follow up in 3 months.

## 2023-03-04 NOTE — Progress Notes (Signed)
New Patient Office Visit  Subjective    Patient ID: Kenneth Struble., male    DOB: 09/02/1946  Age: 77 y.o. MRN: [id number]  CC:  Chief Complaint  Patient presents with   Establish Care    HPI Kenneth Watkins. presents to establish care.  He is a 77 year old male with a past medical history significant for T2DM, CKD 2, BPH, CAD s/p PCI (2021), paroxysmal atrial fibrillation, HTN, HLD, and hypogonadism.  Previously followed by Dr. Sudie Bailey.  Kenneth Watkins reports feeling well today.  He is asymptomatic and has no acute concerns to discuss aside from desiring to establish care.  He is currently retired.  He denies tobacco, alcohol, illicit drug use.  His family medical history is significant for diabetes mellitus, CVA, CAD, HTN, and uterine cancer.  Chronic medical conditions and outstanding preventative care items discussed today are individually addressed A/P below.   Outpatient Encounter Medications as of 03/04/2023  Medication Sig   Accu-Chek Softclix Lancets lancets 1 each by Other route as needed.   acetaminophen (TYLENOL) 650 MG CR tablet Take 650 mg by mouth every 8 (eight) hours as needed for pain.   apixaban (ELIQUIS) 5 MG TABS tablet Take 5 mg by mouth 2 (two) times daily.   atorvastatin (LIPITOR) 80 MG tablet Take 1 tablet (80 mg total) by mouth daily.   B-D 3CC LUER-LOK SYR 22GX1" 22G X 1" 3 ML MISC 1 each by Other route as needed.   BD DISP NEEDLES 18G X 1-1/2" MISC 1 each by Other route as needed.   benazepril (LOTENSIN) 20 MG tablet Take 20 mg by mouth daily.   chlorthalidone (HYGROTON) 25 MG tablet Take 25 mg by mouth daily.   cholecalciferol (VITAMIN D3) 25 MCG (1000 UNIT) tablet Take 1,000 Units by mouth daily.   clotrimazole-betamethasone (LOTRISONE) cream Apply 1 application topically daily as needed (Rash).    Coenzyme Q10 (CO Q 10 PO) Take 1 capsule by mouth daily.   CONTOUR NEXT TEST test strip 1 each by Other route daily.   dutasteride (AVODART) 0.5 MG capsule Take 1  capsule (0.5 mg total) by mouth daily.   empagliflozin (JARDIANCE) 25 MG TABS tablet Take 12.5 mg by mouth daily.   famotidine (PEPCID) 10 MG tablet Take 10 mg by mouth daily as needed for heartburn or indigestion.   Misc Natural Products (MENS PROSTATE HEALTH FORMULA PO) Take 1 tablet by mouth in the morning and at bedtime. Prostate Formula: [employer]   Misc Natural Products (OSTEO BI-FLEX TRIPLE STRENGTH PO) Take 1 tablet by mouth in the morning and at bedtime.   Multiple Vitamins-Minerals (MULTIVITAMIN WITH MINERALS) tablet Take 1 tablet by mouth daily. Men's   tadalafil (CIALIS) 10 MG tablet Take 1 tablet by mouth as needed for erectile dysfunction.   tamsulosin (FLOMAX) 0.4 MG CAPS capsule Take 1 capsule (0.4 mg total) by mouth daily.   testosterone cypionate (DEPOTESTOSTERONE CYPIONATE) 200 MG/ML injection Inject 200 mg into the muscle every 21 ( twenty-one) days.   verapamil (CALAN-SR) 120 MG CR tablet TAKE 1 TABLET BY MOUTH EVERYDAY AT BEDTIME   [DISCONTINUED] nitroGLYCERIN (NITROSTAT) 0.4 MG SL tablet Place 1 tablet (0.4 mg total) under the tongue every 5 (five) minutes as needed for chest pain.   No facility-administered encounter medications on file as of 03/04/2023.    Past Medical History:  Diagnosis Date   Allergy 2012   Tree pollen   Arthritis 2019   some in the fingers   BPH (  benign prostatic hyperplasia)    CAD (coronary artery disease)    a. s/p DES to OM1 and DES to RCA in 02/2020   Cataract 2014   cataracts surgery for both   CKD (chronic kidney disease) stage 3, GFR 30-59 ml/min (HCC)    Clotting disorder (HCC) 2017   take blood thinner   GERD (gastroesophageal reflux disease)    Hyperlipidemia    Hypertension    Persistent atrial fibrillation (HCC)    Pinched nerve in neck    Type 2 diabetes mellitus (HCC)     Past Surgical History:  Procedure Laterality Date   COLONOSCOPY WITH PROPOFOL N/A 04/21/2021   Procedure: COLONOSCOPY WITH PROPOFOL;  Surgeon:  Dolores Frame, MD;  Location: [facility];  Service: Gastroenterology;  Laterality: N/A;  7:30   CORONARY STENT INTERVENTION N/A 02/24/2020   Procedure: CORONARY STENT INTERVENTION;  Surgeon: Lennette Bihari, MD;  Location: [facility];  Service: Cardiovascular;  Laterality: N/A;   EYE SURGERY     HERNIA REPAIR  2023   INGUINAL HERNIA REPAIR Right 01/29/2022   Procedure: HERNIA REPAIR INGUINAL ADULT WITH MESH;  Surgeon: Lucretia Roers, MD;  Location: [facility];  Service: General;  Laterality: Right;   LEFT HEART CATH AND CORONARY ANGIOGRAPHY N/A 02/23/2020   Procedure: LEFT HEART CATH AND CORONARY ANGIOGRAPHY;  Surgeon: Lennette Bihari, MD;  Location: [facility];  Service: Cardiovascular;  Laterality: N/A;   TONSILLECTOMY     VASECTOMY      Family History  Problem Relation Age of Onset   Uterine cancer Mother    Diabetes Mother    Stroke Mother    Hearing loss Mother    Heart disease Mother    Hypertension Mother    Heart attack Father    Stroke Father    Heart disease Father     Social History   Socioeconomic History   Marital status: Married    Spouse name: Not on file   Number of children: Not on file   Years of education: Not on file   Highest education level: Not on file  Occupational History   Occupation: Surveyor, minerals  Tobacco Use   Smoking status: Never   Smokeless tobacco: Never  Vaping Use   Vaping Use: Never used  Substance and Sexual Activity   Alcohol use: No   Drug use: No   Sexual activity: Yes  Other Topics Concern   Not on file  Social History Narrative   Not on file   Social Determinants of Health   Financial Resource Strain: Not on file  Food Insecurity: Not on file  Transportation Needs: Not on file  Physical Activity: Not on file  Stress: Not on file  Social Connections: Not on file  Intimate Partner Violence: Not on file   Review of Systems  Constitutional:  Negative for chills and fever.  HENT:   Negative for sore throat.   Respiratory:  Negative for cough and shortness of breath.   Cardiovascular:  Negative for chest pain, palpitations and leg swelling.  Gastrointestinal:  Negative for abdominal pain, blood in stool, constipation, diarrhea, nausea and vomiting.  Genitourinary:  Negative for dysuria and hematuria.  Musculoskeletal:  Negative for myalgias.  Skin:  Negative for itching and rash.  Neurological:  Negative for dizziness and headaches.  Psychiatric/Behavioral:  Negative for depression and suicidal ideas.    Objective    BP 116/71   Pulse 66   Ht  6\' 1"  (1.854 m)   Wt 182 lb 6.4 oz (82.7 kg)   SpO2 95%   BMI 24.06 kg/m   Physical Exam Vitals reviewed.  Constitutional:      General: He is not in acute distress.    Appearance: Normal appearance. He is not ill-appearing.  HENT:     Head: Normocephalic and atraumatic.     Right Ear: External ear normal.     Left Ear: External ear normal.     Nose: Nose normal. No congestion or rhinorrhea.     Mouth/Throat:     Mouth: Mucous membranes are moist.     Pharynx: Oropharynx is clear.  Eyes:     General: No scleral icterus.    Extraocular Movements: Extraocular movements intact.     Conjunctiva/sclera: Conjunctivae normal.     Pupils: Pupils are equal, round, and reactive to light.  Cardiovascular:     Rate and Rhythm: Normal rate and regular rhythm.     Pulses: Normal pulses.     Heart sounds: Normal heart sounds. No murmur heard. Pulmonary:     Effort: Pulmonary effort is normal.     Breath sounds: Normal breath sounds. No wheezing, rhonchi or rales.  Abdominal:     General: Abdomen is flat. Bowel sounds are normal. There is no distension.     Palpations: Abdomen is soft.     Tenderness: There is no abdominal tenderness.  Musculoskeletal:        General: No swelling or deformity. Normal range of motion.     Cervical back: Normal range of motion.  Skin:    General: Skin is warm and dry.     Capillary  Refill: Capillary refill takes less than 2 seconds.  Neurological:     General: No focal deficit present.     Mental Status: He is alert and oriented to person, place, and time.     Motor: No weakness.  Psychiatric:        Mood and Affect: Mood normal.        Behavior: Behavior normal.        Thought Content: Thought content normal.    Assessment & Plan:   Problem List Items Addressed This Visit       CAD (coronary artery disease), native coronary artery;DES to OM1 LCX, DES to mid RCA 02/24/20    History of CAD s/p PCI with DES to OM1, LCx, and mid RCA in June 2021.  He denies recent chest pain.  Followed by cardiology.  Currently prescribed atorvastatin 80 mg daily and is also taking Eliquis.      Paroxysmal atrial fibrillation (HCC)    History of paroxysmal atrial fibrillation.  Currently prescribed Eliquis 5 mg twice daily.  Regular rate and rhythm detected on exam today.  Cardiology follow-up is scheduled for August.      Hypertension    Well-controlled on current regimen of benazepril, chlorthalidone, and verapamil.  No medication changes are indicated today.      Type 2 diabetes mellitus with complication, without long-term current use of insulin (HCC)    He is currently prescribed Jardiance 12.5 mg daily.  On ACE/statin therapy. -Repeat A1c ordered today      Hypogonadism in male    History of hypogonadism.  Currently on testosterone replacement therapy. -Repeat testosterone levels ordered today, to be completed one morning later this week.      Tinea cruris    Uses clotrimazole/betamethasone cream as needed.      CKD (  chronic kidney disease) stage 2, GFR 60-89 ml/min    CKD stage II.  Followed by nephrology (Dr. Wolfgang Phoenix).  He is currently prescribed benazepril as well as Jardiance. -Repeat labs ordered today      BPH (benign prostatic hyperplasia)    He endorses a history of BPH.  Followed by urology (Dr. Ronne Binning).  Reports that he is stopping  finasteride/alfuzosin and transitioning to Flomax/dutasteride.      Hyperlipidemia - Primary    Currently prescribed atorvastatin 80 mg daily.  Repeat lipid panel ordered today.      Return in about 3 months (around 06/04/2023).   Billie Lade, MD

## 2023-03-07 LAB — CBC WITH DIFFERENTIAL/PLATELET
Basos: 1 %
Hematocrit: 50.9 % (ref 37.5–51.0)
Immature Granulocytes: 0 %
Lymphocytes Absolute: 1.6 10*3/uL (ref 0.7–3.1)
MCV: 90 fL (ref 79–97)
Platelets: 184 10*3/uL (ref 150–450)
WBC: 5.3 10*3/uL (ref 3.4–10.8)

## 2023-03-07 LAB — CMP14+EGFR
Albumin: 4.4 g/dL (ref 3.8–4.8)
Potassium: 3.9 mmol/L (ref 3.5–5.2)
eGFR: 47 mL/min/{1.73_m2} — ABNORMAL LOW (ref >59)

## 2023-03-09 LAB — CBC WITH DIFFERENTIAL/PLATELET
Basophils Absolute: 0.1 10*3/uL (ref 0.0–0.2)
EOS (ABSOLUTE): 0.2 10*3/uL (ref 0.0–0.4)
Eos: 4 %
Hemoglobin: 17.2 g/dL (ref 13.0–17.7)
Immature Grans (Abs): 0 10*3/uL (ref 0.0–0.1)
Lymphs: 29 %
MCH: 30.6 pg (ref 26.6–33.0)
MCHC: 33.8 g/dL (ref 31.5–35.7)
Monocytes Absolute: 0.4 10*3/uL (ref 0.1–0.9)
Monocytes: 8 %
Neutrophils Absolute: 3.1 10*3/uL (ref 1.4–7.0)
Neutrophils: 58 %
RBC: 5.63 x10E6/uL (ref 4.14–5.80)
RDW: 13.3 % (ref 11.6–15.4)

## 2023-03-09 LAB — CMP14+EGFR
ALT: 32 IU/L (ref 0–44)
AST: 25 IU/L (ref 0–40)
Alkaline Phosphatase: 80 IU/L (ref 44–121)
BUN/Creatinine Ratio: 17 (ref 10–24)
BUN: 26 mg/dL (ref 8–27)
Bilirubin Total: 0.9 mg/dL (ref 0.0–1.2)
CO2: 24 mmol/L (ref 20–29)
Calcium: 9.7 mg/dL (ref 8.6–10.2)
Chloride: 100 mmol/L (ref 96–106)
Creatinine, Ser: 1.53 mg/dL — ABNORMAL HIGH (ref 0.76–1.27)
Globulin, Total: 2.3 g/dL (ref 1.5–4.5)
Glucose: 114 mg/dL — ABNORMAL HIGH (ref 70–99)
Sodium: 140 mmol/L (ref 134–144)
Total Protein: 6.7 g/dL (ref 6.0–8.5)

## 2023-03-09 LAB — TSH+FREE T4
Free T4: 1.13 ng/dL (ref 0.82–1.77)
TSH: 4.98 u[IU]/mL — ABNORMAL HIGH (ref 0.450–4.500)

## 2023-03-09 LAB — LIPID PANEL
Chol/HDL Ratio: 4.1 ratio (ref 0.0–5.0)
Cholesterol, Total: 151 mg/dL (ref 100–199)
HDL: 37 mg/dL — ABNORMAL LOW (ref >39)
LDL Chol Calc (NIH): 85 mg/dL (ref 0–99)
Triglycerides: 170 mg/dL — ABNORMAL HIGH (ref 0–149)
VLDL Cholesterol Cal: 29 mg/dL (ref 5–40)

## 2023-03-09 LAB — B12 AND FOLATE PANEL
Folate: >20 ng/mL (ref >3.0)
Vitamin B-12: 946 pg/mL (ref 232–1245)

## 2023-03-09 LAB — TESTOSTERONE,FREE AND TOTAL
Testosterone, Free: 8.3 pg/mL (ref 6.6–18.1)
Testosterone: 271 ng/dL (ref 264–916)

## 2023-03-09 LAB — HEMOGLOBIN A1C
Est. average glucose Bld gHb Est-mCnc: 169 mg/dL
Hgb A1c MFr Bld: 7.5 % — ABNORMAL HIGH (ref 4.8–5.6)

## 2023-03-10 DIAGNOSIS — B356 Tinea cruris: Secondary | ICD-10-CM | POA: Insufficient documentation

## 2023-03-10 DIAGNOSIS — E291 Testicular hypofunction: Secondary | ICD-10-CM | POA: Insufficient documentation

## 2023-03-10 DIAGNOSIS — N400 Enlarged prostate without lower urinary tract symptoms: Secondary | ICD-10-CM | POA: Insufficient documentation

## 2023-03-10 NOTE — Assessment & Plan Note (Signed)
He is currently prescribed Jardiance 12.5 mg daily.  On ACE/statin therapy. -Repeat A1c ordered today

## 2023-03-10 NOTE — Assessment & Plan Note (Signed)
Well-controlled on current regimen of benazepril, chlorthalidone, and verapamil.  No medication changes are indicated today.

## 2023-03-10 NOTE — Assessment & Plan Note (Signed)
CKD stage II.  Followed by nephrology (Dr. Wolfgang Phoenix).  He is currently prescribed benazepril as well as Jardiance. -Repeat labs ordered today

## 2023-03-10 NOTE — Assessment & Plan Note (Signed)
Currently prescribed atorvastatin 80 mg daily.  Repeat lipid panel ordered today.

## 2023-03-10 NOTE — Assessment & Plan Note (Signed)
History of paroxysmal atrial fibrillation.  Currently prescribed Eliquis 5 mg twice daily.  Regular rate and rhythm detected on exam today.  Cardiology follow-up is scheduled for August.

## 2023-03-10 NOTE — Assessment & Plan Note (Signed)
Uses clotrimazole/betamethasone cream as needed.

## 2023-03-10 NOTE — Assessment & Plan Note (Signed)
History of hypogonadism.  Currently on testosterone replacement therapy. -Repeat testosterone levels ordered today, to be completed one morning later this week.

## 2023-03-10 NOTE — Assessment & Plan Note (Signed)
He endorses a history of BPH.  Followed by urology (Dr. Ronne Binning).  Reports that he is stopping finasteride/alfuzosin and transitioning to Flomax/dutasteride.

## 2023-03-10 NOTE — Assessment & Plan Note (Addendum)
History of CAD s/p PCI with DES to OM1, LCx, and mid RCA in June 2021.  He denies recent chest pain.  Followed by cardiology.  Currently prescribed atorvastatin 80 mg daily and is also taking Eliquis.

## 2023-03-12 ENCOUNTER — Telehealth: Payer: Self-pay

## 2023-03-12 NOTE — Telephone Encounter (Signed)
Returned call to Belenda Cruise and made her aware she that patient will need to call office to schedule an appointment with provider to discuss testosterone treatment. Baxter Hire states she will contact patient and make him aware.

## 2023-03-12 NOTE — Telephone Encounter (Signed)
[facility]  Wanting to knnow if Dr. Ronne Binning can take over patient's Testosterone shots?   Lab was done 03-06-23   Call [facility] at  [phone]

## 2023-03-18 ENCOUNTER — Ambulatory Visit: Payer: Medicare Other | Admitting: Cardiology

## 2023-03-25 ENCOUNTER — Encounter: Payer: Self-pay | Admitting: Internal Medicine

## 2023-05-01 ENCOUNTER — Ambulatory Visit: Payer: Medicare Other | Attending: Cardiology | Admitting: Cardiology

## 2023-05-01 ENCOUNTER — Encounter: Payer: Self-pay | Admitting: Cardiology

## 2023-05-01 VITALS — BP 112/60 | HR 73 | Ht 73.0 in | Wt 186.8 lb

## 2023-05-01 DIAGNOSIS — I480 Paroxysmal atrial fibrillation: Secondary | ICD-10-CM | POA: Insufficient documentation

## 2023-05-01 DIAGNOSIS — E782 Mixed hyperlipidemia: Secondary | ICD-10-CM | POA: Insufficient documentation

## 2023-05-01 DIAGNOSIS — I25119 Atherosclerotic heart disease of native coronary artery with unspecified angina pectoris: Secondary | ICD-10-CM | POA: Insufficient documentation

## 2023-05-01 NOTE — Patient Instructions (Addendum)

## 2023-05-01 NOTE — Progress Notes (Signed)
    Cardiology Office Note  Date: 05/01/2023   ID: Kenneth Papa., DOB Jul 16, 1946, MRN [id number]  History of Present Illness: Kenneth Shellhorn. is a 77 y.o. male last seen in January.  He is here for a routine visit.  Reports no palpitations or significant episodes of atrial fibrillation over the last 6 months.  He has an Apple watch to help him track this.  Also denies any angina with typical activities.  I reviewed his medications.  He is tolerating current regimen well.  No spontaneous bleeding problems on Eliquis.  He had lab work with PCP in June which I reviewed.  Physical Exam: VS:  BP 112/60   Pulse 73   Ht 6\' 1"  (1.854 m)   Wt 186 lb 12.8 oz (84.7 kg)   SpO2 98%   BMI 24.65 kg/m , BMI Body mass index is 24.65 kg/m.  Wt Readings from Last 3 Encounters:  05/01/23 186 lb 12.8 oz (84.7 kg)  03/04/23 182 lb 6.4 oz (82.7 kg)  10/02/22 188 lb 6.4 oz (85.5 kg)    General: Patient appears comfortable at rest. HEENT: Conjunctiva and lids normal. Neck: Supple, no elevated JVP or carotid bruits. Lungs: Clear to auscultation, nonlabored breathing at rest. Cardiac: Regular rate and rhythm, no S3 or significant systolic murmur. Extremities: No pitting edema.  ECG:  An ECG dated 10/02/2022 was personally reviewed today and demonstrated:  Sinus rhythm with prolonged PR interval.  Labwork: 03/06/2023: ALT 32; AST 25; BUN 26; Creatinine, Ser 1.53; Hemoglobin 17.2; Platelets 184; Potassium 3.9; Sodium 140; TSH 4.980     Component Value Date/Time   CHOL 151 03/06/2023 0943   TRIG 170 (H) 03/06/2023 0943   HDL 37 (L) 03/06/2023 0943   CHOLHDL 4.1 03/06/2023 0943   CHOLHDL 4.0 08/17/2020 1030   VLDL 29 02/24/2020 0411   LDLCALC 85 03/06/2023 0943   LDLCALC 81 08/17/2020 1030   Other Studies Reviewed Today:  No interval cardiac testing for review today.  Assessment and Plan:  1.  CAD status post DES to the OM1 and DES to the RCA in June 2021.  He does not report any active angina  at this time with typical activities.  Not on aspirin given use of Eliquis.  Continue Lipitor, Jardiance, and as needed nitroglycerin.  2.  Paroxysmal atrial fibrillation with CHA2DS2-VASc score of 4.  No progressive palpitations or major breakthrough episodes over the last 6 months.  He is on verapamil and Eliquis.  No reported spontaneous bleeding problems.  3.  CKD stage IIIb.  Recent creatinine 1.53.  Continue to follow with nephrology.  4.  Essential hypertension.  Blood pressure is normal today.  5.  Mixed hyperlipidemia.  LDL 85 in June.  Continue high-dose Lipitor.  Disposition:  Follow up  6 months.  Signed, [facility], M.D., F.A.C.C. [facility]

## 2023-05-02 ENCOUNTER — Other Ambulatory Visit: Payer: Self-pay

## 2023-05-02 DIAGNOSIS — Z79899 Other long term (current) drug therapy: Secondary | ICD-10-CM

## 2023-05-07 ENCOUNTER — Encounter: Payer: Self-pay | Admitting: Urology

## 2023-06-04 ENCOUNTER — Encounter: Payer: Self-pay | Admitting: Internal Medicine

## 2023-06-04 ENCOUNTER — Ambulatory Visit (INDEPENDENT_AMBULATORY_CARE_PROVIDER_SITE_OTHER): Payer: Medicare Other | Admitting: Internal Medicine

## 2023-06-04 VITALS — BP 136/70 | HR 65 | Ht 73.0 in | Wt 189.0 lb

## 2023-06-04 DIAGNOSIS — Z23 Encounter for immunization: Secondary | ICD-10-CM

## 2023-06-04 DIAGNOSIS — B353 Tinea pedis: Secondary | ICD-10-CM | POA: Diagnosis not present

## 2023-06-04 DIAGNOSIS — E782 Mixed hyperlipidemia: Secondary | ICD-10-CM | POA: Diagnosis not present

## 2023-06-04 DIAGNOSIS — N182 Chronic kidney disease, stage 2 (mild): Secondary | ICD-10-CM

## 2023-06-04 DIAGNOSIS — Z7984 Long term (current) use of oral hypoglycemic drugs: Secondary | ICD-10-CM

## 2023-06-04 DIAGNOSIS — E118 Type 2 diabetes mellitus with unspecified complications: Secondary | ICD-10-CM

## 2023-06-04 NOTE — Assessment & Plan Note (Signed)
Lipid panel updated in June.  Total cholesterol 151 and LDL 85.  He is currently prescribed atorvastatin 80 mg daily.  No additional changes are indicated at this time.

## 2023-06-04 NOTE — Patient Instructions (Signed)
It was a pleasure to see you today.  Thank you for giving [place] the opportunity to be involved in your care.  Below is a brief recap of your visit and next steps.  We will plan to see you again in 4 months.  Summary No medication changes today Podiatry referral placed Flu shot administered Follow up in 4 months

## 2023-06-04 NOTE — Assessment & Plan Note (Signed)
A1c 7.5 on recent labs, which is increased from 6.6 when previously checked.  He is currently prescribed Jardiance 12.5 mg daily.  For now, he prefers to focus on lifestyle modifications aimed at improving diabetes control.  Consider increasing Jardiance to 25 mg daily if A1c remains above goal when repeated. -Podiatry referral placed at patient's request -Diabetic eye exam is scheduled for next month

## 2023-06-04 NOTE — Assessment & Plan Note (Signed)
Renal function is stable on recent labs.  He is scheduled for follow-up with nephrology (Dr. Wolfgang Phoenix) next month.

## 2023-06-04 NOTE — Progress Notes (Signed)
Established Patient Office Visit  Subjective   Patient ID: Kenneth Meseke., male    DOB: 12-08-1945  Age: 77 y.o. MRN: [id number]  Chief Complaint  Patient presents with   Hyperlipidemia    F/u   Kenneth Watkins returns to care today for routine follow-up.  He was last evaluated by me on 6/17 as a new patient presenting to establish care.  No medication changes were made at that time and baseline labs were ordered.  27-month follow-up was arranged.  In the interim, he has been evaluated by cardiology for follow-up.  There have otherwise been no acute interval events. Kenneth Watkins reports feeling fairly well today.  He endorses mild nasal congestion and states that he has recently been treated for a severe tinea cruris infection.  He does not have any acute concerns to discuss today.  Past Medical History:  Diagnosis Date   Allergy 2012   Tree pollen   Arthritis 2019   some in the fingers   BPH (benign prostatic hyperplasia)    CAD (coronary artery disease)    a. s/p DES to OM1 and DES to RCA in 02/2020   Cataract 2014   cataracts surgery for both   CKD (chronic kidney disease) stage 3, GFR 30-59 ml/min (HCC)    Clotting disorder (HCC) 2017   take blood thinner   GERD (gastroesophageal reflux disease)    Hyperlipidemia    Hypertension    Persistent atrial fibrillation (HCC)    Pinched nerve in neck    Type 2 diabetes mellitus (HCC)    Past Surgical History:  Procedure Laterality Date   COLONOSCOPY WITH PROPOFOL N/A 04/21/2021   Procedure: COLONOSCOPY WITH PROPOFOL;  Surgeon: Dolores Frame, MD;  [facility]: [facility];  Service: Gastroenterology;  Laterality: N/A;  7:30   CORONARY STENT INTERVENTION N/A 02/24/2020   Procedure: CORONARY STENT INTERVENTION;  Surgeon: Lennette Bihari, MD;  [facility]: [facility];  Service: Cardiovascular;  Laterality: N/A;   EYE SURGERY     HERNIA REPAIR  2023   INGUINAL HERNIA REPAIR Right 01/29/2022   Procedure: HERNIA REPAIR  INGUINAL ADULT WITH MESH;  Surgeon: Lucretia Roers, MD;  Location: [facility];  Service: General;  Laterality: Right;   LEFT HEART CATH AND CORONARY ANGIOGRAPHY N/A 02/23/2020   Procedure: LEFT HEART CATH AND CORONARY ANGIOGRAPHY;  Surgeon: Lennette Bihari, MD;  [facility]: [facility];  Service: Cardiovascular;  Laterality: N/A;   TONSILLECTOMY     VASECTOMY     Social History   Tobacco Use   Smoking status: Never   Smokeless tobacco: Never  Vaping Use   Vaping status: Never Used  Substance Use Topics   Alcohol use: No   Drug use: No   Family History  Problem Relation Age of Onset   Uterine cancer Mother    Diabetes Mother    Stroke Mother    Hearing loss Mother    Heart disease Mother    Hypertension Mother    Heart attack Father    Stroke Father    Heart disease Father    Allergies  Allergen Reactions   Codeine Nausea And Vomiting   Farxiga [Dapagliflozin]     Weakness, light headed   Review of Systems  Constitutional:  Negative for chills and fever.  HENT:  Positive for congestion. Negative for sore throat.   Respiratory:  Negative for cough and shortness of breath.   Cardiovascular:  Negative for chest pain, palpitations  and leg swelling.  Gastrointestinal:  Negative for abdominal pain, blood in stool, constipation, diarrhea, nausea and vomiting.  Genitourinary:  Negative for dysuria and hematuria.  Musculoskeletal:  Negative for myalgias.  Skin:  Negative for itching and rash.  Neurological:  Negative for dizziness and headaches.  Psychiatric/Behavioral:  Negative for depression and suicidal ideas.      Objective:     BP 136/70   Pulse 65   Ht 6\' 1"  (1.854 m)   Wt 189 lb (85.7 kg)   SpO2 97%   BMI 24.94 kg/m  BP Readings from Last 3 Encounters:  06/04/23 136/70  05/01/23 112/60  03/04/23 116/71   Physical Exam Vitals reviewed.  Constitutional:      General: He is not in acute distress.    Appearance: Normal appearance. He is not  ill-appearing.  HENT:     Head: Normocephalic and atraumatic.     Right Ear: External ear normal.     Left Ear: External ear normal.     Nose: Nose normal. No congestion or rhinorrhea.     Mouth/Throat:     Mouth: Mucous membranes are moist.     Pharynx: Oropharynx is clear.  Eyes:     General: No scleral icterus.    Extraocular Movements: Extraocular movements intact.     Conjunctiva/sclera: Conjunctivae normal.     Pupils: Pupils are equal, round, and reactive to light.  Cardiovascular:     Rate and Rhythm: Normal rate and regular rhythm.     Pulses: Normal pulses.     Heart sounds: Normal heart sounds. No murmur heard. Pulmonary:     Effort: Pulmonary effort is normal.     Breath sounds: Normal breath sounds. No wheezing, rhonchi or rales.  Abdominal:     General: Abdomen is flat. Bowel sounds are normal. There is no distension.     Palpations: Abdomen is soft.     Tenderness: There is no abdominal tenderness.  Musculoskeletal:        General: No swelling or deformity. Normal range of motion.     Cervical back: Normal range of motion.  Skin:    General: Skin is warm and dry.     Capillary Refill: Capillary refill takes less than 2 seconds.  Neurological:     General: No focal deficit present.     Mental Status: He is alert and oriented to person, place, and time.     Motor: No weakness.  Psychiatric:        Mood and Affect: Mood normal.        Behavior: Behavior normal.        Thought Content: Thought content normal.   Last CBC Lab Results  Component Value Date   WBC 5.3 03/06/2023   HGB 17.2 03/06/2023   HCT 50.9 03/06/2023   MCV 90 03/06/2023   MCH 30.6 03/06/2023   RDW 13.3 03/06/2023   PLT 184 03/06/2023   Last metabolic panel Lab Results  Component Value Date   GLUCOSE 114 (H) 03/06/2023   NA 140 03/06/2023   K 3.9 03/06/2023   CL 100 03/06/2023   CO2 24 03/06/2023   BUN 26 03/06/2023   CREATININE 1.53 (H) 03/06/2023   EGFR 47 (L) 03/06/2023    CALCIUM 9.7 03/06/2023   PROT 6.7 03/06/2023   ALBUMIN 4.4 03/06/2023   LABGLOB 2.3 03/06/2023   BILITOT 0.9 03/06/2023   ALKPHOS 80 03/06/2023   AST 25 03/06/2023   ALT 32 03/06/2023   ANIONGAP  7 01/26/2022   Last lipids Lab Results  Component Value Date   CHOL 151 03/06/2023   HDL 37 (L) 03/06/2023   LDLCALC 85 03/06/2023   TRIG 170 (H) 03/06/2023   CHOLHDL 4.1 03/06/2023   Last hemoglobin A1c Lab Results  Component Value Date   HGBA1C 7.5 (H) 03/06/2023   Last thyroid functions Lab Results  Component Value Date   TSH 4.980 (H) 03/06/2023   Last vitamin D No results found for: "25OHVITD2", "25OHVITD3", "VD25OH" Last vitamin B12 and Folate Lab Results  Component Value Date   VITAMINB12 946 03/06/2023   FOLATE >20.0 03/06/2023   The 10-year ASCVD risk score (Arnett DK, et al., 2019) is: 51.5%    Assessment & Plan:   Problem List Items Addressed This Visit       Type 2 diabetes mellitus with complication, without long-term current use of insulin (HCC)    A1c 7.5 on recent labs, which is increased from 6.6 when previously checked.  He is currently prescribed Jardiance 12.5 mg daily.  For now, he prefers to focus on lifestyle modifications aimed at improving diabetes control.  Consider increasing Jardiance to 25 mg daily if A1c remains above goal when repeated. -Podiatry referral placed at patient's request -Diabetic eye exam is scheduled for next month      CKD (chronic kidney disease) stage 2, GFR 60-89 ml/min    Renal function is stable on recent labs.  He is scheduled for follow-up with nephrology (Dr. Wolfgang Phoenix) next month.      Hyperlipidemia    Lipid panel updated in June.  Total cholesterol 151 and LDL 85.  He is currently prescribed atorvastatin 80 mg daily.  No additional changes are indicated at this time.      Need for influenza vaccination    Influenza vaccine administered today      Return in about 4 months (around 10/04/2023) for diabetes.    Billie Lade, MD

## 2023-06-04 NOTE — Assessment & Plan Note (Signed)
Influenza vaccine administered today.

## 2023-06-06 ENCOUNTER — Ambulatory Visit: Payer: Medicare Other

## 2023-06-07 ENCOUNTER — Encounter: Payer: Self-pay | Admitting: Podiatrist

## 2023-06-07 ENCOUNTER — Ambulatory Visit (INDEPENDENT_AMBULATORY_CARE_PROVIDER_SITE_OTHER): Payer: Medicare Other | Admitting: Podiatrist

## 2023-06-07 DIAGNOSIS — H903 Sensorineural hearing loss, bilateral: Secondary | ICD-10-CM | POA: Insufficient documentation

## 2023-06-07 DIAGNOSIS — M79609 Pain in unspecified limb: Secondary | ICD-10-CM

## 2023-06-07 DIAGNOSIS — B353 Tinea pedis: Secondary | ICD-10-CM | POA: Diagnosis not present

## 2023-06-07 DIAGNOSIS — E119 Type 2 diabetes mellitus without complications: Secondary | ICD-10-CM

## 2023-06-07 DIAGNOSIS — N529 Male erectile dysfunction, unspecified: Secondary | ICD-10-CM | POA: Insufficient documentation

## 2023-06-07 DIAGNOSIS — B351 Tinea unguium: Secondary | ICD-10-CM | POA: Diagnosis not present

## 2023-06-07 DIAGNOSIS — Z461 Encounter for fitting and adjustment of hearing aid: Secondary | ICD-10-CM | POA: Insufficient documentation

## 2023-06-07 MED ORDER — KETOCONAZOLE 2 % EX CREA: TOPICAL_CREAM | CUTANEOUS | 3 refills | Status: AC

## 2023-06-07 NOTE — Progress Notes (Signed)
Chief Complaint  Patient presents with   Diabetes    Foot exam - last A1c was 7.5, requesting nail trim, dermatologist Rx'd terbinafine x 2 weeks for a rash on his buttock and thought would have helped toenails, but didn't, some athletes feet   New Patient (Initial Visit)     HPI: Patient is 77 y.o. male with type 2 diabetes who presents today for foot exam as well as thickened dystrophic nails and peeling skin of feet.  He relates no numbness, no tingling, no signs of neuropathy.  Overall states his feet are doing well.  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   Right inguinal hernia 01/02/2022   Diastasis recti 01/02/2022   Secondary hypercoagulable state (HCC) 05/23/2021   Hyperlipidemia 02/25/2020   Paroxysmal atrial fibrillation (HCC) 02/25/2020   Hypertension 02/25/2020   CKD (chronic kidney disease) stage 2, GFR 60-89 ml/min 02/25/2020   Type 2 diabetes mellitus with complication, without long-term current use of insulin (HCC) 02/25/2020   CAD (coronary artery disease), native coronary artery;DES to OM1 LCX, DES to mid RCA 02/24/20 02/23/2020   Exertional dyspnea    Abnormal nuclear stress test    Impingement syndrome of left shoulder 05/31/2016   Bilateral hearing loss 12/29/2014    Current Outpatient Medications on File Prior to Visit  Medication Sig Dispense Refill   terbinafine (LAMISIL) 250 MG tablet Take 250 mg by mouth daily.     Accu-Chek Softclix  Lancets lancets 1 each by Other route as needed.     acetaminophen (TYLENOL) 650 MG CR tablet Take 650 mg by mouth every 8 (eight) hours as needed for pain.     apixaban (ELIQUIS) 5 MG TABS tablet Take 5 mg by mouth 2 (two) times daily.     atorvastatin (LIPITOR) 80 MG tablet Take 1 tablet (80 mg total) by mouth daily. 90 tablet 3   B-D 3CC LUER-LOK SYR 22GX1" 22G X 1" 3 ML MISC 1 each by Other route as needed.     BD DISP NEEDLES 18G X 1-1/2" MISC 1 each by Other route as needed.     benazepril (LOTENSIN) 20 MG tablet Take 20 mg by mouth daily.     chlorthalidone (HYGROTON) 25 MG tablet Take 25 mg by mouth daily.     cholecalciferol (VITAMIN D3) 25 MCG (1000 UNIT) tablet Take 1,000 Units by mouth daily.     clotrimazole-betamethasone (LOTRISONE) cream Apply 1 application  topically daily as needed (Rash). Do not apply to buttocks     Coenzyme Q10 (CO Q 10 PO) Take 1 capsule by mouth daily.     CONTOUR NEXT TEST test strip 1 each by Other route daily.     dutasteride (AVODART) 0.5 MG capsule Take 1 capsule (0.5 mg total) by mouth daily. 30 capsule 11   empagliflozin (JARDIANCE) 25 MG TABS tablet Take 12.5 mg by mouth daily.     famotidine (PEPCID) 10 MG tablet Take 10 mg by mouth daily as needed for heartburn or  indigestion.     fluticasone (FLONASE) 50 MCG/ACT nasal spray Place 2 sprays into both nostrils daily.     KLOR-CON M10 10 MEQ tablet Take 10 mEq by mouth daily.     Misc Natural Products (MENS PROSTATE HEALTH FORMULA PO) Take 1 tablet by mouth in the morning and at bedtime. Prostate Formula: [employer]     Misc Natural Products (OSTEO BI-FLEX TRIPLE STRENGTH PO) Take 1 tablet by mouth in the morning and at bedtime.     Multiple Vitamins-Minerals (MULTIVITAMIN WITH MINERALS) tablet Take 1 tablet by mouth daily. Men's     tadalafil (CIALIS) 10 MG tablet Take 1 tablet by mouth as needed for erectile dysfunction.     tamsulosin (FLOMAX) 0.4 MG CAPS capsule Take 1 capsule (0.4 mg total) by  mouth daily. 30 capsule 11   testosterone cypionate (DEPOTESTOSTERONE CYPIONATE) 200 MG/ML injection Inject 200 mg into the muscle every 21 ( twenty-one) days.     verapamil (CALAN-SR) 120 MG CR tablet TAKE 1 TABLET BY MOUTH EVERYDAY AT BEDTIME 90 tablet 3   No current facility-administered medications on file prior to visit.    Allergies  Allergen Reactions   Codeine Nausea And Vomiting   Farxiga [Dapagliflozin]     Weakness, light headed    Review of Systems No fevers, chills, nausea, muscle aches, no difficulty breathing, no calf pain, no chest pain or shortness of breath.   Physical Exam  GENERAL APPEARANCE: Alert, conversant. Appropriately groomed. No acute distress.   VASCULAR: Pedal pulses palpable 2/4 DP and/4 PT bilateral.  Capillary refill time is immediate to all digits,  Proximal to distal cooling is warm to warm.  Digital perfusion adequate.   NEUROLOGIC: sensation is intact to 5.07 monofilament at 5/5 sites bilateral.  Light touch is intact bilateral, vibratory sensation intact bilateral  MUSCULOSKELETAL: acceptable muscle strength, tone and stability bilateral.  No gross boney pedal deformities noted.  No pain, crepitus or limitation noted with foot and ankle range of motion bilateral.   DERMATOLOGIC: skin is warm, supple, and dry.  Color, texture, and turgor of skin within normal limits.  No open wounds are noted.  No preulcerative lesions are seen.  Digital nails are thickened, discolored, dystrophic, yellowish and brownish discoloration and clinically mycotic x 10.  Thin peeling of the plantar aspect of bilateral feet in a moccasin like distribution is noted consistent with plantar tinea.    Assessment    ICD-10-CM   1. Pain due to onychomycosis of nail  B35.1    M79.609     2. Encounter for diabetic foot exam (HCC)  E11.9     3. Tinea pedis of both feet  B35.3         Plan  Discussed exam findings with the patient.  Overall he is doing excellent.  He  has no signs of neuropathy in his pedal pulses are palpable and strong.  Discussed continuing use of good supportive shoe gear.  And daily foot inspections.  Recommended ketoconazole for the peeling skin on bilateral feet and this was written for him today.  Also debridement of digital nails x 10 was accomplished today via manual and mechanical means via sterile nail nipper and power bur was performed.  He will be seen back in 3 months for continued care and recheck and if any problems or concerns arise in the meantime he is instructed to call.

## 2023-06-25 ENCOUNTER — Ambulatory Visit (INDEPENDENT_AMBULATORY_CARE_PROVIDER_SITE_OTHER): Payer: Medicare Other

## 2023-06-25 VITALS — Ht 73.0 in | Wt 189.0 lb

## 2023-06-25 DIAGNOSIS — Z0000 Encounter for general adult medical examination without abnormal findings: Secondary | ICD-10-CM | POA: Diagnosis not present

## 2023-06-25 NOTE — Patient Instructions (Signed)
Kenneth Watkins , Thank you for taking time to come for your Medicare Wellness Visit. I appreciate your ongoing commitment to your health goals. Please review the following plan we discussed and let me know if I can assist you in the future.   Referrals/Orders/Follow-Ups/Clinician Recommendations:  Next Medicare Annual Wellness Visit: June 29, 2024 at 2:30pm virtual visit  This is a list of the screening recommended for you and due dates:  Health Maintenance  Topic Date Due   Eye exam for diabetics  Never done   COVID-19 Vaccine (3 - Moderna risk series) 01/22/2020   Hemoglobin A1C  09/05/2023   Yearly kidney health urinalysis for diabetes  11/29/2023   Yearly kidney function blood test for diabetes  03/05/2024   Complete foot exam   06/06/2024   Medicare Annual Wellness Visit  06/24/2024   Colon Cancer Screening  04/22/2031   DTaP/Tdap/Td vaccine (4 - Td or Tdap) 12/03/2032   Pneumonia Vaccine  Completed   Flu Shot  Completed   Zoster (Shingles) Vaccine  Completed   HPV Vaccine  Aged Out   Hepatitis C Screening  Discontinued    Advanced directives: (Provided) Advance directive discussed with you today. I have provided a copy for you to complete at home and have notarized. Once this is complete, please bring a copy in to our office so we can scan it into your chart.   Next Medicare Annual Wellness Visit scheduled for next year: Yes  Preventive Care 75 Years and Older, Male Preventive care refers to lifestyle choices and visits with your health care provider that can promote health and wellness. Preventive care visits are also called wellness exams. What can I expect for my preventive care visit? Counseling During your preventive care visit, your health care provider may ask about your: Medical history, including: Past medical problems. Family medical history. History of falls. Current health, including: Emotional well-being. Home life and relationship well-being. Sexual  activity. Memory and ability to understand (cognition). Lifestyle, including: Alcohol, nicotine or tobacco, and drug use. Access to firearms. Diet, exercise, and sleep habits. Work and work Astronomer. Sunscreen use. Safety issues such as seatbelt and bike helmet use. Physical exam Your health care provider will check your: Height and weight. These may be used to calculate your BMI (body mass index). BMI is a measurement that tells if you are at a healthy weight. Waist circumference. This measures the distance around your waistline. This measurement also tells if you are at a healthy weight and may help predict your risk of certain diseases, such as type 2 diabetes and high blood pressure. Heart rate and blood pressure. Body temperature. Skin for abnormal spots. What immunizations do I need?  Vaccines are usually given at various ages, according to a schedule. Your health care provider will recommend vaccines for you based on your age, medical history, and lifestyle or other factors, such as travel or where you work. What tests do I need? Screening Your health care provider may recommend screening tests for certain conditions. This may include: Lipid and cholesterol levels. Diabetes screening. This is done by checking your blood sugar (glucose) after you have not eaten for a while (fasting). Hepatitis C test. Hepatitis B test. HIV (human immunodeficiency virus) test. STI (sexually transmitted infection) testing, if you are at risk. Lung cancer screening. Colorectal cancer screening. Prostate cancer screening. Abdominal aortic aneurysm (AAA) screening. You may need this if you are a current or former smoker. Talk with your health care provider about your  test results, treatment options, and if necessary, the need for more tests. Follow these instructions at home: Eating and drinking  Eat a diet that includes fresh fruits and vegetables, whole grains, lean protein, and low-fat  dairy products. Limit your intake of foods with high amounts of sugar, saturated fats, and salt. Take vitamin and mineral supplements as recommended by your health care provider. Do not drink alcohol if your health care provider tells you not to drink. If you drink alcohol: Limit how much you have to 0-2 drinks a day. Know how much alcohol is in your drink. In the [place] one drink equals one 12 oz bottle of beer (355 mL), one 5 oz glass of wine (148 mL), or one 1 oz glass of hard liquor (44 mL). Lifestyle Brush your teeth every morning and night with fluoride toothpaste. Floss one time each day. Exercise for at least 30 minutes 5 or more days each week. Do not use any products that contain nicotine or tobacco. These products include cigarettes, chewing tobacco, and vaping devices, such as e-cigarettes. If you need help quitting, ask your health care provider. Do not use drugs. If you are sexually active, practice safe sex. Use a condom or other form of protection to prevent STIs. Take aspirin only as told by your health care provider. Make sure that you understand how much to take and what form to take. Work with your health care provider to find out whether it is safe and beneficial for you to take aspirin daily. Ask your health care provider if you need to take a cholesterol-lowering medicine (statin). Find healthy ways to manage stress, such as: Meditation, yoga, or listening to music. Journaling. Talking to a trusted person. Spending time with friends and family. Safety Always wear your seat belt while driving or riding in a vehicle. Do not drive: If you have been drinking alcohol. Do not ride with someone who has been drinking. When you are tired or distracted. While texting. If you have been using any mind-altering substances or drugs. Wear a helmet and other protective equipment during sports activities. If you have firearms in your house, make sure you follow all gun safety  procedures. Minimize exposure to UV radiation to reduce your risk of skin cancer. What's next? Visit your health care provider once a year for an annual wellness visit. Ask your health care provider how often you should have your eyes and teeth checked. Stay up to date on all vaccines. This information is not intended to replace advice given to you by your health care provider. Make sure you discuss any questions you have with your health care provider. Document Revised: 03/01/2021 Document Reviewed: 03/01/2021 Elsevier Patient Education  2024 [employer]. Understanding Your Risk for Falls Millions of people have serious injuries from falls each year. It is important to understand your risk of falling. Talk with your health care provider about your risk and what you can do to lower it. If you do have a serious fall, make sure to tell your provider. Falling once raises your risk of falling again. How can falls affect me? Serious injuries from falls are common. These include: Broken bones, such as hip fractures. Head injuries, such as traumatic brain injuries (TBI) or concussions. A fear of falling can cause you to avoid activities and stay at home. This can make your muscles weaker and raise your risk for a fall. What can increase my risk? There are a number of risk factors that increase your  risk for falling. The more risk factors you have, the higher your risk of falling. Serious injuries from a fall happen most often to people who are older than 75 years old. Teenagers and young adults ages 61-29 are also at higher risk. Common risk factors include: Weakness in the lower body. Being generally weak or confused due to long-term (chronic) illness. Dizziness or balance problems. Poor vision. Medicines that cause dizziness or drowsiness. These may include: Medicines for your blood pressure, heart, anxiety, insomnia, or swelling (edema). Pain medicines. Muscle relaxants. Other risk factors  include: Drinking alcohol. Having had a fall in the past. Having foot pain or wearing improper footwear. Working at a dangerous job. Having any of the following in your home: Tripping hazards, such as floor clutter or loose rugs. Poor lighting. Pets. Having dementia or memory loss. What actions can I take to lower my risk of falling?     Physical activity Stay physically fit. Do strength and balance exercises. Consider taking a regular class to build strength and balance. Yoga and tai chi are good options. Vision Have your eyes checked every year and your prescription for glasses or contacts updated as needed. Shoes and walking aids Wear non-skid shoes. Wear shoes that have rubber soles and low heels. Do not wear high heels. Do not walk around the house in socks or slippers. Use a cane or walker as told by your provider. Home safety Attach secure railings on both sides of your stairs. Install grab bars for your bathtub, shower, and toilet. Use a non-skid mat in your bathtub or shower. Attach bath mats securely with double-sided, non-slip rug tape. Use good lighting in all rooms. Keep a flashlight near your bed. Make sure there is a clear path from your bed to the bathroom. Use night-lights. Do not use throw rugs. Make sure all carpeting is taped or tacked down securely. Remove all clutter from walkways and stairways, including extension cords. Repair uneven or broken steps and floors. Avoid walking on icy or slippery surfaces. Walk on the grass instead of on icy or slick sidewalks. Use ice melter to get rid of ice on walkways in the winter. Use a cordless phone. Questions to ask your health care provider Can you help me check my risk for a fall? Do any of my medicines make me more likely to fall? Should I take a vitamin D supplement? What exercises can I do to improve my strength and balance? Should I make an appointment to have my vision checked? Do I need a bone density test  to check for weak bones (osteoporosis)? Would it help to use a cane or a walker? Where to find more information Centers for Disease Control and Prevention, [employer]: TonerPromos.no Community-Based Fall Prevention Programs: TonerPromos.no [employer] on Aging: BaseRingTones.pl Contact a health care provider if: You fall at home. You are afraid of falling at home. You feel weak, drowsy, or dizzy. This information is not intended to replace advice given to you by your health care provider. Make sure you discuss any questions you have with your health care provider. Document Revised: 05/07/2022 Document Reviewed: 05/07/2022 Elsevier Patient Education  2024 [employer].

## 2023-06-25 NOTE — Progress Notes (Signed)
 Because this visit was a virtual/telehealth visit,  certain criteria was not obtained, such a blood pressure, CBG if applicable, and timed get up and go. Any medications not marked as "taking" were not mentioned during the medication reconciliation part of the visit. Any vitals not documented were not able to be obtained due to this being a telehealth visit or patient was unable to self-report a recent blood pressure reading due to a lack of equipment at home via telehealth. Vitals that have been documented are verbally provided by the patient.   Subjective:   Kenneth Watkins. is a 77 y.o. male who presents for an Initial Medicare Annual Wellness Visit.  Visit Complete: Virtual I connected with  Renita Papa. on 06/25/23 by a audio enabled telemedicine application and verified that I am speaking with the correct person using two identifiers.  Patient Location: Home  Provider Location: Home Office  I discussed the limitations of evaluation and management by telemedicine. The patient expressed understanding and agreed to proceed.  Vital Signs: Because this visit was a virtual/telehealth visit, some criteria may be missing or patient reported. Any vitals not documented were not able to be obtained and vitals that have been documented are patient reported.  Patient Medicare AWV questionnaire was completed by the patient on 06/23/23; I have confirmed that all information answered by patient is correct and no changes since this date.  Cardiac Risk Factors include: advanced age (>55men, >66 women);diabetes mellitus;dyslipidemia;hypertension;male gender     Objective:    Today's Vitals   06/23/23 2134 06/25/23 1022  Weight:  189 lb (85.7 kg)  Height:  6\' 1"  (1.854 m)  PainSc: 0-No pain    Body mass index is 24.94 kg/m.     06/25/2023   10:24 AM 01/29/2022    6:34 AM 01/26/2022    9:33 AM 04/21/2021    6:40 AM 04/19/2021    9:27 AM 02/23/2020   10:00 AM 02/23/2020    7:16 AM  Advanced  Directives  Does Patient Have a Medical Advance Directive? No No No No No No No  Would patient like information on creating a medical advance directive? Yes (MAU/Ambulatory/Procedural Areas - Information given) No - Patient declined No - Patient declined No - Patient declined No - Patient declined No - Patient declined No - Patient declined    Current Medications (verified) Outpatient Encounter Medications as of 06/25/2023  Medication Sig   Accu-Chek Softclix Lancets lancets 1 each by Other route as needed.   acetaminophen (TYLENOL) 650 MG CR tablet Take 650 mg by mouth every 8 (eight) hours as needed for pain.   apixaban (ELIQUIS) 5 MG TABS tablet Take 5 mg by mouth 2 (two) times daily.   atorvastatin (LIPITOR) 80 MG tablet Take 1 tablet (80 mg total) by mouth daily.   B-D 3CC LUER-LOK SYR 22GX1" 22G X 1" 3 ML MISC 1 each by Other route as needed.   BD DISP NEEDLES 18G X 1-1/2" MISC 1 each by Other route as needed.   benazepril (LOTENSIN) 20 MG tablet Take 20 mg by mouth daily.   chlorthalidone (HYGROTON) 25 MG tablet Take 25 mg by mouth daily.   cholecalciferol (VITAMIN D3) 25 MCG (1000 UNIT) tablet Take 1,000 Units by mouth daily.   clotrimazole-betamethasone (LOTRISONE) cream Apply 1 application  topically daily as needed (Rash). Do not apply to buttocks   Coenzyme Q10 (CO Q 10 PO) Take 1 capsule by mouth daily.   CONTOUR NEXT TEST test strip 1  each by Other route daily.   dutasteride (AVODART) 0.5 MG capsule Take 1 capsule (0.5 mg total) by mouth daily.   empagliflozin (JARDIANCE) 25 MG TABS tablet Take 12.5 mg by mouth daily.   famotidine (PEPCID) 10 MG tablet Take 10 mg by mouth daily as needed for heartburn or indigestion.   fluticasone (FLONASE) 50 MCG/ACT nasal spray Place 2 sprays into both nostrils daily.   ketoconazole (NIZORAL) 2 % cream Apply to skin on bottoms of feet twice daily until peeling resolves (about 3 weeks)   KLOR-CON M10 10 MEQ tablet Take 10 mEq by mouth daily.    Misc Natural Products (MENS PROSTATE HEALTH FORMULA PO) Take 1 tablet by mouth in the morning and at bedtime. Prostate Formula: Real Health   Misc Natural Products (OSTEO BI-FLEX TRIPLE STRENGTH PO) Take 1 tablet by mouth in the morning and at bedtime.   Multiple Vitamins-Minerals (MULTIVITAMIN WITH MINERALS) tablet Take 1 tablet by mouth daily. Men's   tadalafil (CIALIS) 10 MG tablet Take 1 tablet by mouth as needed for erectile dysfunction.   tamsulosin (FLOMAX) 0.4 MG CAPS capsule Take 1 capsule (0.4 mg total) by mouth daily.   testosterone cypionate (DEPOTESTOSTERONE CYPIONATE) 200 MG/ML injection Inject 200 mg into the muscle every 21 ( twenty-one) days.   verapamil (CALAN-SR) 120 MG CR tablet TAKE 1 TABLET BY MOUTH EVERYDAY AT BEDTIME   terbinafine (LAMISIL) 250 MG tablet Take 250 mg by mouth daily.   No facility-administered encounter medications on file as of 06/25/2023.    Allergies (verified) Codeine and Farxiga [dapagliflozin]   History: Past Medical History:  Diagnosis Date   Allergy 2012   Tree pollen   Arthritis 2019   some in the fingers   BPH (benign prostatic hyperplasia)    CAD (coronary artery disease)    a. s/p DES to OM1 and DES to RCA in 02/2020   Cataract 2014   cataracts surgery for both   CKD (chronic kidney disease) stage 3, GFR 30-59 ml/min (HCC)    Clotting disorder (HCC) 2017   take blood thinner   GERD (gastroesophageal reflux disease)    Hyperlipidemia    Hypertension    Persistent atrial fibrillation (HCC)    Pinched nerve in neck    Type 2 diabetes mellitus (HCC)    Past Surgical History:  Procedure Laterality Date   COLONOSCOPY WITH PROPOFOL N/A 04/21/2021   Procedure: COLONOSCOPY WITH PROPOFOL;  Surgeon: Dolores Frame, MD;  [facility];  Service: Gastroenterology;  Laterality: N/A;  7:30   CORONARY STENT INTERVENTION N/A 02/24/2020   Procedure: CORONARY STENT INTERVENTION;  Surgeon: Lennette Bihari, MD;  [facility];  Service: Cardiovascular;  Laterality: N/A;   EYE SURGERY     HERNIA REPAIR  2023   INGUINAL HERNIA REPAIR Right 01/29/2022   Procedure: HERNIA REPAIR INGUINAL ADULT WITH MESH;  Surgeon: Lucretia Roers, MD;  Location: [facility];  Service: General;  Laterality: Right;   LEFT HEART CATH AND CORONARY ANGIOGRAPHY N/A 02/23/2020   Procedure: LEFT HEART CATH AND CORONARY ANGIOGRAPHY;  Surgeon: Lennette Bihari, MD;  [facility];  Service: Cardiovascular;  Laterality: N/A;   TONSILLECTOMY     VASECTOMY     Family History  Problem Relation Age of Onset   Uterine cancer Mother    Diabetes Mother    Stroke Mother    Hearing loss Mother    Heart disease Mother    Hypertension Mother  Cancer Mother    Heart attack Father    Stroke Father    Heart disease Father    Social History   Socioeconomic History   Marital status: Married    Spouse name: Not on file   Number of children: Not on file   Years of education: Not on file   Highest education level: Bachelor's degree (e.g., BA, AB, BS)  Occupational History   Occupation: Surveyor, minerals  Tobacco Use   Smoking status: Never   Smokeless tobacco: Never  Vaping Use   Vaping status: Never Used  Substance and Sexual Activity   Alcohol use: No   Drug use: Never   Sexual activity: Yes  Other Topics Concern   Not on file  Social History Narrative   Not on file   Social Determinants of Health   Financial Resource Strain: Low Risk  (06/23/2023)   Overall Financial Resource Strain (CARDIA)    Difficulty of Paying Living Expenses: Not hard at all  Food Insecurity: No Food Insecurity (06/23/2023)   Hunger Vital Sign    Worried About Running Out of Food in the Last Year: Never true    Ran Out of Food in the Last Year: Never true  Transportation Needs: No Transportation Needs (06/23/2023)   PRAPARE - Administrator, Civil Service (Medical): No    Lack of Transportation (Non-Medical): No   Physical Activity: Insufficiently Active (06/23/2023)   Exercise Vital Sign    Days of Exercise per Week: 4 days    Minutes of Exercise per Session: 30 min  Stress: No Stress Concern Present (06/23/2023)   [employer] of Occupational Health - Occupational Stress Questionnaire    Feeling of Stress : Not at all  Social Connections: Socially Integrated (06/23/2023)   Social Connection and Isolation Panel [NHANES]    Frequency of Communication with Friends and Family: Three times a week    Frequency of Social Gatherings with Friends and Family: Three times a week    Attends Religious Services: More than 4 times per year    Active Member of Clubs or Organizations: Yes    Attends Engineer, structural: More than 4 times per year    Marital Status: Married    Tobacco Counseling Counseling given: Yes   Clinical Intake:  Pre-visit preparation completed: Yes  Pain : No/denies pain Pain Score: 0-No pain     BMI - recorded: 24.94 Nutritional Status: BMI of 19-24  Normal Nutritional Risks: None Diabetes: Yes CBG done?: No (telehealth visit) Did pt. bring in CBG monitor from home?: No  How often do you need to have someone help you when you read instructions, pamphlets, or other written materials from your doctor or pharmacy?: 1 - Never  Interpreter Needed?: No  Information entered by ::  Ellary Casamento, CMA   Activities of Daily Living    06/23/2023    9:34 PM 06/02/2023    1:59 PM  In your present state of health, do you have any difficulty performing the following activities:  Hearing? 1 1  Vision? 0 0  Difficulty concentrating or making decisions? 0 0  Walking or climbing stairs? 0 0  Dressing or bathing? 0 0  Doing errands, shopping? 0 0  Preparing Food and eating ? N N  Using the Toilet? N N  In the past six months, have you accidently leaked urine? N N  Do you have problems with loss of bowel control? N N  Managing your Medications? N  N  Managing your  Finances? N N  Housekeeping or managing your Housekeeping? N N    Patient Care Team: Billie Lade, MD as PCP - General (Internal Medicine) Jonelle Sidle, MD as PCP - Cardiology (Cardiology)  Indicate any recent Medical Services you may have received from other than [facility] providers in the past year (date may be approximate).     Assessment:   This is a routine wellness examination for [place].  Hearing/Vision screen Hearing Screening - Comments:: Wears hearing aids  Vision Screening - Comments:: Wears rx glasses - up to date with routine eye exams with Dr[facility]io [facility]   Goals Addressed             This Visit's Progress    Patient Stated       Increase exercise       Depression Screen    06/25/2023   10:27 AM 03/04/2023    2:04 PM 12/26/2021    1:58 PM  PHQ 2/9 Scores  PHQ - 2 Score 0 0 0  PHQ- 9 Score  1     Fall Risk    06/23/2023    9:34 PM 06/04/2023    1:55 PM 06/02/2023    1:59 PM 03/04/2023    2:04 PM 02/27/2022    9:23 AM  Fall Risk   Falls in the past year? 1 1 1  0 0  Number falls in past yr: 0 0 0 0   Injury with Fall? 0 0 0 0   Risk for fall due to : Impaired balance/gait   No Fall Risks   Follow up    Falls evaluation completed Falls evaluation completed    MEDICARE RISK AT HOME: Medicare Risk at Home Any stairs in or around the home?: Yes If so, are there any without handrails?: No Home free of loose throw rugs in walkways, pet beds, electrical cords, etc?: No Adequate lighting in your home to reduce risk of falls?: Yes Life alert?: No Use of a cane, walker or w/c?: No Grab bars in the bathroom?: No Shower chair or bench in shower?: No Elevated toilet seat or a handicapped toilet?: No  TIMED UP AND GO:  Was the test performed? No    Cognitive Function:        06/25/2023   10:25 AM  6CIT Screen  What Year? 0 points  What month? 0 points  What time? 0 points  Count back from 20 2 points  Months in reverse 0 points   Repeat phrase 0 points  Total Score 2 points    Immunizations Immunization History  Administered Date(s) Administered   Fluad Trivalent(High Dose 65+) 06/04/2023   Influenza-Unspecified 07/06/2021, 07/25/2022   Moderna Sars-Covid-2 Vaccination 11/19/2019, 12/25/2019   Pneumococcal Conjugate-13 07/05/2015   Pneumococcal Polysaccharide-23 07/25/2016   Tdap 11/16/2006, 05/15/2017, 12/04/2022   Zoster Recombinant(Shingrix) 06/28/2018, 11/26/2018, 11/27/2018    TDAP status: Up to date  Flu Vaccine status: Up to date  Pneumococcal vaccine status: Up to date  Covid-19 vaccine status: Information provided on how to obtain vaccines.   Qualifies for Shingles Vaccine? No   Zostavax completed Yes   Shingrix Completed?: Yes  Screening Tests Health Maintenance  Topic Date Due   Medicare Annual Wellness (AWV)  Never done   FOOT EXAM  Never done   OPHTHALMOLOGY EXAM  Never done   Hepatitis C Screening  Never done   COVID-19 Vaccine (3 - Moderna risk series) 01/22/2020   HEMOGLOBIN A1C  09/05/2023   Diabetic kidney evaluation - Urine ACR  11/29/2023   Diabetic kidney evaluation - eGFR measurement  03/05/2024   Colonoscopy  04/22/2031   DTaP/Tdap/Td (4 - Td or Tdap) 12/03/2032   Pneumonia Vaccine 32+ Years old  Completed   INFLUENZA VACCINE  Completed   Zoster Vaccines- Shingrix  Completed   HPV VACCINES  Aged Out    Health Maintenance  Health Maintenance Due  Topic Date Due   Medicare Annual Wellness (AWV)  Never done   FOOT EXAM  Never done   OPHTHALMOLOGY EXAM  Never done   Hepatitis C Screening  Never done   COVID-19 Vaccine (3 - Moderna risk series) 01/22/2020    Colorectal cancer screening: Type of screening: Colonoscopy. Completed 04/21/21. Repeat every 10 years  Lung Cancer Screening: (Low Dose CT Chest recommended if Age 8-80 years, 20 pack-year currently smoking OR have quit w/in 15years.) does not qualify.    Additional Screening:  Hepatitis C Screening: does  not qualify; Completed 07/09/22  Vision Screening: Recommended annual ophthalmology exams for early detection of glaucoma and other disorders of the eye. Is the patient up to date with their annual eye exam?  Yes  Who is the provider or what is the name of the office in which the patient attends annual eye exams? Dr. Otilio Connors [facility] If pt is not established with a provider, would they like to be referred to a provider to establish care? No .   Dental Screening: Recommended annual dental exams for proper oral hygiene  Diabetic Foot Exam: Diabetic Foot Exam: Completed 06/07/2023  Community Resource Referral / Chronic Care Management: CRR required this visit?  No   CCM required this visit?  No    Plan:     I have personally reviewed and noted the following in the patient's chart:   Medical and social history Use of alcohol, tobacco or illicit drugs  Current medications and supplements including opioid prescriptions. Patient is not currently taking opioid prescriptions. Functional ability and status Nutritional status Physical activity Advanced directives List of other physicians Hospitalizations, surgeries, and ER visits in previous 12 months Vitals Screenings to include cognitive, depression, and falls Referrals and appointments  In addition, I have reviewed and discussed with patient certain preventive protocols, quality metrics, and best practice recommendations. A written personalized care plan for preventive services as well as general preventive health recommendations were provided to patient.     Jordan Hawks Marlina Cataldi, CMA   06/25/2023   After Visit Summary: (MyChart) Due to this being a telephonic visit, the after visit summary with patients personalized plan was offered to patient via MyChart

## 2023-06-26 ENCOUNTER — Ambulatory Visit: Payer: Medicare Other | Admitting: Urology

## 2023-06-26 ENCOUNTER — Encounter: Payer: Self-pay | Admitting: Urology

## 2023-06-26 VITALS — BP 145/72 | HR 84

## 2023-06-26 DIAGNOSIS — N5201 Erectile dysfunction due to arterial insufficiency: Secondary | ICD-10-CM

## 2023-06-26 DIAGNOSIS — N401 Enlarged prostate with lower urinary tract symptoms: Secondary | ICD-10-CM | POA: Diagnosis not present

## 2023-06-26 DIAGNOSIS — R339 Retention of urine, unspecified: Secondary | ICD-10-CM

## 2023-06-26 DIAGNOSIS — N138 Other obstructive and reflux uropathy: Secondary | ICD-10-CM | POA: Diagnosis not present

## 2023-06-26 LAB — URINALYSIS, ROUTINE W REFLEX MICROSCOPIC
Bilirubin, UA: NEGATIVE
Ketones, UA: NEGATIVE
Leukocytes,UA: NEGATIVE
Nitrite, UA: NEGATIVE
Protein,UA: NEGATIVE
RBC, UA: NEGATIVE
Specific Gravity, UA: 1.01 (ref 1.005–1.030)
Urobilinogen, Ur: 0.2 mg/dL (ref 0.2–1.0)
pH, UA: 7 (ref 5.0–7.5)

## 2023-06-26 LAB — BLADDER SCAN AMB NON-IMAGING: Scan Result: 268

## 2023-06-26 MED ORDER — DUTASTERIDE 0.5 MG PO CAPS: 0.5000 mg | ORAL_CAPSULE | Freq: Every day | ORAL | 11 refills | Status: DC

## 2023-06-26 MED ORDER — TAMSULOSIN HCL 0.4 MG PO CAPS: 0.4000 mg | ORAL_CAPSULE | Freq: Every day | ORAL | 11 refills | Status: DC

## 2023-06-26 MED ORDER — TADALAFIL 10 MG PO TABS: 10.0000 mg | ORAL_TABLET | ORAL | 5 refills | Status: DC | PRN

## 2023-06-26 NOTE — Patient Instructions (Signed)

## 2023-06-26 NOTE — Progress Notes (Signed)
post void residual=268

## 2023-06-26 NOTE — Progress Notes (Signed)
06/26/2023 1:59 PM   Kenneth Watkins. December 04, 1945 [id number]  Referring provider: Gareth Morgan, MD [address] [place],  [place] [address]  Followup BPh and erectile dysfunction   HPI: Kenneth Watkins is a 77yo here for BPh and erectile dysfunction. IPSS 4 QOL 1 on flomax daily and dutasteride. Urine stream strong. No straining to urinate. Nocturia 1-2x.  He uses tadalafil and a VED with good results.   PMH: Past Medical History:  Diagnosis Date   Allergy 2012   Tree pollen   Arthritis 2019   some in the fingers   BPH (benign prostatic hyperplasia)    CAD (coronary artery disease)    a. s/p DES to OM1 and DES to RCA in 02/2020   Cataract 2014   cataracts surgery for both   CKD (chronic kidney disease) stage 3, GFR 30-59 ml/min (HCC)    Clotting disorder (HCC) 2017   take blood thinner   GERD (gastroesophageal reflux disease)    Hyperlipidemia    Hypertension    Persistent atrial fibrillation (HCC)    Pinched nerve in neck    Type 2 diabetes mellitus (HCC)     Surgical History: Past Surgical History:  Procedure Laterality Date   COLONOSCOPY WITH PROPOFOL N/A 04/21/2021   Procedure: COLONOSCOPY WITH PROPOFOL;  Surgeon: Dolores Frame, MD;  [facility];  Service: Gastroenterology;  Laterality: N/A;  7:30   CORONARY STENT INTERVENTION N/A 02/24/2020   Procedure: CORONARY STENT INTERVENTION;  Surgeon: Lennette Bihari, MD;  [facility];  Service: Cardiovascular;  Laterality: N/A;   EYE SURGERY     HERNIA REPAIR  2023   INGUINAL HERNIA REPAIR Right 01/29/2022   Procedure: HERNIA REPAIR INGUINAL ADULT WITH MESH;  Surgeon: Lucretia Roers, MD;  Location: [facility];  Service: General;  Laterality: Right;   LEFT HEART CATH AND CORONARY ANGIOGRAPHY N/A 02/23/2020   Procedure: LEFT HEART CATH AND CORONARY ANGIOGRAPHY;  Surgeon: Lennette Bihari, MD;  [facility];  Service: Cardiovascular;  Laterality: N/A;   TONSILLECTOMY      VASECTOMY      Home Medications:  Allergies as of 06/26/2023       Reactions   Codeine Nausea And Vomiting   Farxiga [dapagliflozin]    Weakness, light headed        Medication List        Accurate as of June 26, 2023  1:59 PM. If you have any questions, ask your nurse or doctor.          Accu-Chek Softclix Lancets lancets 1 each by Other route as needed.   acetaminophen 650 MG CR tablet Commonly known as: TYLENOL Take 650 mg by mouth every 8 (eight) hours as needed for pain.   apixaban 5 MG Tabs tablet Commonly known as: ELIQUIS Take 5 mg by mouth 2 (two) times daily.   atorvastatin 80 MG tablet Commonly known as: LIPITOR Take 1 tablet (80 mg total) by mouth daily.   B-D 3CC LUER-LOK SYR 22GX1" 22G X 1" 3 ML Misc Generic drug: SYRINGE-NEEDLE (DISP) 3 ML 1 each by Other route as needed.   BD Disp Needles 18G X 1-1/2" Misc Generic drug: NEEDLE (DISP) 18 G 1 each by Other route as needed.   benazepril 20 MG tablet Commonly known as: LOTENSIN Take 20 mg by mouth daily.   chlorthalidone 25 MG tablet Commonly known as: HYGROTON Take 25 mg by mouth daily.   cholecalciferol 25  MCG (1000 UNIT) tablet Commonly known as: VITAMIN D3 Take 1,000 Units by mouth daily.   clotrimazole-betamethasone cream Commonly known as: LOTRISONE Apply 1 application  topically daily as needed (Rash). Do not apply to buttocks   CO Q 10 PO Take 1 capsule by mouth daily.   Contour Next Test test strip Generic drug: glucose blood 1 each by Other route daily.   dutasteride 0.5 MG capsule Commonly known as: AVODART Take 1 capsule (0.5 mg total) by mouth daily.   famotidine 10 MG tablet Commonly known as: PEPCID Take 10 mg by mouth daily as needed for heartburn or indigestion.   fluticasone 50 MCG/ACT nasal spray Commonly known as: FLONASE Place 2 sprays into both nostrils daily.   Jardiance 25 MG Tabs tablet Generic drug: empagliflozin Take 12.5 mg by mouth  daily.   ketoconazole 2 % cream Commonly known as: NIZORAL Apply to skin on bottoms of feet twice daily until peeling resolves (about 3 weeks)   Klor-Con M10 10 MEQ tablet Generic drug: potassium chloride Take 10 mEq by mouth daily.   MENS PROSTATE HEALTH FORMULA PO Take 1 tablet by mouth in the morning and at bedtime. Prostate Formula: Real Health   OSTEO BI-FLEX TRIPLE STRENGTH PO Take 1 tablet by mouth in the morning and at bedtime.   multivitamin with minerals tablet Take 1 tablet by mouth daily. Men's   tadalafil 10 MG tablet Commonly known as: CIALIS Take 1 tablet by mouth as needed for erectile dysfunction.   tamsulosin 0.4 MG Caps capsule Commonly known as: FLOMAX Take 1 capsule (0.4 mg total) by mouth daily.   terbinafine 250 MG tablet Commonly known as: LAMISIL Take 250 mg by mouth daily.   testosterone cypionate 200 MG/ML injection Commonly known as: DEPOTESTOSTERONE CYPIONATE Inject 200 mg into the muscle every 21 ( twenty-one) days.   verapamil 120 MG CR tablet Commonly known as: CALAN-SR TAKE 1 TABLET BY MOUTH EVERYDAY AT BEDTIME        Allergies:  Allergies  Allergen Reactions   Codeine Nausea And Vomiting   Farxiga [Dapagliflozin]     Weakness, light headed    Family History: Family History  Problem Relation Age of Onset   Uterine cancer Mother    Diabetes Mother    Stroke Mother    Hearing loss Mother    Heart disease Mother    Hypertension Mother    Cancer Mother    Heart attack Father    Stroke Father    Heart disease Father     Social History:  reports that he has never smoked. He has never used smokeless tobacco. He reports that he does not drink alcohol and does not use drugs.  ROS: All other review of systems were reviewed and are negative except what is noted above in HPI  Physical Exam: BP (!) 145/72   Pulse 84   Constitutional:  Alert and oriented, No acute distress. HEENT: [place] AT, moist mucus membranes.  Trachea  midline, no masses. Cardiovascular: No clubbing, cyanosis, or edema. Respiratory: Normal respiratory effort, no increased work of breathing. GI: Abdomen is soft, nontender, nondistended, no abdominal masses GU: No CVA tenderness.  Lymph: No cervical or inguinal lymphadenopathy. Skin: No rashes, bruises or suspicious lesions. Neurologic: Grossly intact, no focal deficits, moving all 4 extremities. Psychiatric: Normal mood and affect.  Laboratory Data: Lab Results  Component Value Date   WBC 5.3 03/06/2023   HGB 17.2 03/06/2023   HCT 50.9 03/06/2023   MCV 90  03/06/2023   PLT 184 03/06/2023    Lab Results  Component Value Date   CREATININE 1.53 (H) 03/06/2023    No results found for: "PSA"  Lab Results  Component Value Date   TESTOSTERONE 271 03/06/2023    Lab Results  Component Value Date   HGBA1C 7.5 (H) 03/06/2023    Urinalysis    Component Value Date/Time   APPEARANCEUR Clear 12/12/2022 1329   GLUCOSEU 3+ (A) 12/12/2022 1329   BILIRUBINUR Negative 12/12/2022 1329   PROTEINUR Negative 12/12/2022 1329   NITRITE Negative 12/12/2022 1329   LEUKOCYTESUR Negative 12/12/2022 1329    Lab Results  Component Value Date   LABMICR Comment 12/12/2022   WBCUA 0-5 09/04/2022   LABEPIT 0-10 09/04/2022   BACTERIA None seen 09/04/2022    Pertinent Imaging:  No results found for this or any previous visit.  No results found for this or any previous visit.  No results found for this or any previous visit.  No results found for this or any previous visit.  Results for orders placed during the hospital encounter of 07/11/22  US RENAL  Narrative CLINICAL DATA:  Chronic kidney disease due to type II diabetes mellitus, hypertension  EXAM: RENAL / URINARY TRACT ULTRASOUND COMPLETE  COMPARISON:  None Available.  FINDINGS: Right Kidney:  Renal measurements: 11.3 x 5.4 x 4.8 cm = volume: 153 mL. Normal cortical thickness with upper normal cortical echogenicity.  No mass, hydronephrosis, or shadowing calcification.  Left Kidney:  Renal measurements: 11.6 x 6.5 x 5.6 cm = volume: 217 mL. Normal cortical thickness. Upper normal cortical echogenicity. No mass, hydronephrosis, or shadowing calcification.  Bladder:  Appears normal for degree of bladder distention. Calculated prevoid volume 658 mL with a large postvoid residual volume of 313 mL.  Other:  Enlarged prostate gland, 5.0 x 4.7 x 4.6 cm, 56 mL.  IMPRESSION: Large postvoid residual volume within urinary bladder.  Prostatic enlargement.  No renal sonographic abnormalities.   Electronically Signed By: Ulyses Southward M.D. On: 07/12/2022 18:40  No valid procedures specified. No results found for this or any previous visit.  No results found for this or any previous visit.   Assessment & Plan:    1. Benign prostatic hyperplasia with urinary obstruction -flomnax 0.4mg  daily and durtasteride 0.5mg  daily - Urinalysis, Routine w reflex microscopic - BLADDER SCAN AMB NON-IMAGING  2. Incomplete emptying of bladder Continue flomax and dutasteride  3. Erectile dysfunction due to arterial insufficiency Continue tadalafil 20mg  prn and VED   No follow-ups on file.  Kenneth Aye, MD  [facility] [place]

## 2023-07-01 LAB — HM DIABETES EYE EXAM

## 2023-07-06 ENCOUNTER — Encounter: Payer: Self-pay | Admitting: Internal Medicine

## 2023-07-15 ENCOUNTER — Other Ambulatory Visit: Payer: Self-pay | Admitting: Internal Medicine

## 2023-07-15 MED ORDER — BENAZEPRIL HCL 20 MG PO TABS: 20.0000 mg | ORAL_TABLET | Freq: Every day | ORAL | 0 refills | Status: DC

## 2023-07-15 MED ORDER — "BD DISP NEEDLES 18G X 1-1/2"" MISC": 1.0000 | 0 refills | Status: DC | PRN

## 2023-07-15 MED ORDER — BD LUER-LOK SYRINGE 22G X 1" 3 ML MISC: 1.0000 | 0 refills | Status: DC | PRN

## 2023-07-17 ENCOUNTER — Other Ambulatory Visit (HOSPITAL_BASED_OUTPATIENT_CLINIC_OR_DEPARTMENT_OTHER): Payer: Self-pay

## 2023-07-17 MED ORDER — TESTOSTERONE CYPIONATE 200 MG/ML IM SOLN
200.0000 mg | INTRAMUSCULAR | 0 refills | Status: DC
  Filled 2023-07-17: qty 6, 126d supply, fill #0

## 2023-07-19 ENCOUNTER — Encounter: Payer: Self-pay | Admitting: Internal Medicine

## 2023-07-19 MED ORDER — TESTOSTERONE CYPIONATE 200 MG/ML IM SOLN: 200.0000 mg | INTRAMUSCULAR | 0 refills | Status: DC

## 2023-07-21 ENCOUNTER — Other Ambulatory Visit (HOSPITAL_BASED_OUTPATIENT_CLINIC_OR_DEPARTMENT_OTHER): Payer: Self-pay

## 2023-08-14 ENCOUNTER — Encounter: Payer: Self-pay | Admitting: Internal Medicine

## 2023-08-14 NOTE — Telephone Encounter (Signed)
Eye Care and Surgery - [facility] [address] [place], [place] [address] Ph: [phone] Fax: [phone] Care team updated and letter sent for eye exam notes.

## 2023-08-29 ENCOUNTER — Encounter: Payer: Self-pay | Admitting: Emergency Medicine

## 2023-08-29 ENCOUNTER — Ambulatory Visit: Payer: Medicare Other

## 2023-08-29 ENCOUNTER — Other Ambulatory Visit: Payer: Self-pay

## 2023-08-29 ENCOUNTER — Ambulatory Visit
Admission: EM | Admit: 2023-08-29 | Discharge: 2023-08-29 | Disposition: A | Discharge status: Home / Self Care | Payer: Medicare Other | Attending: Nurse Practitioner | Admitting: Nurse Practitioner

## 2023-08-29 DIAGNOSIS — S8392XA Sprain of unspecified site of left knee, initial encounter: Secondary | ICD-10-CM

## 2023-08-29 DIAGNOSIS — M25562 Pain in left knee: Secondary | ICD-10-CM | POA: Diagnosis not present

## 2023-08-29 NOTE — ED Triage Notes (Signed)
Pain on right side of left knee since Tuesday.  Has been taking tylenol for pain.

## 2023-08-29 NOTE — ED Provider Notes (Signed)
[facility]    CSN: [id number] Arrival date & time: 08/29/23  1450      History   Chief Complaint No chief complaint on file.   HPI Kenneth Watkins. is a 77 y.o. male.   The history is provided by the patient.   Patient presents for complaints of left knee pain that started over the past 2 days.  Patient states prior to his symptoms starting, he went golfing.  He states after he finished golfing, he went to a local store and as he was stepping down he felt a pain that shot from the inside of the left knee down into the inside of the left lower leg.  Patient states he did have difficulty bearing weight as that made the pain worse, also states that he had to sleep with the knee slightly bent to get any relief.  Patient denies swelling, bruising, redness, numbness, tingling, or instability.  Patient reports that he did begin using a cane for his symptoms.  Also states he has been taking Tylenol for his pain.  Denies any prior problems with the left knee. Past Medical History:  Diagnosis Date   Allergy 2012   Tree pollen   Arthritis 2019   some in the fingers   BPH (benign prostatic hyperplasia)    CAD (coronary artery disease)    a. s/p DES to OM1 and DES to RCA in 02/2020   Cataract 2014   cataracts surgery for both   CKD (chronic kidney disease) stage 3, GFR 30-59 ml/min (HCC)    Clotting disorder (HCC) 2017   take blood thinner   GERD (gastroesophageal reflux disease)    Hyperlipidemia    Hypertension    Persistent atrial fibrillation (HCC)    Pinched nerve in neck    Type 2 diabetes mellitus (HCC)     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   BPH (benign prostatic hyperplasia) 03/10/2023   Tinea cruris  03/10/2023   Sacroiliac joint pain 08/29/2022   Chronic low back pain 08/17/2022   Coronary stent patent 07/03/2022   Presbycusis of both ears 06/25/2022   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   Right inguinal hernia 01/02/2022   Diastasis recti 01/02/2022   Secondary hypercoagulable state (HCC) 05/23/2021   Hyperlipidemia 02/25/2020   Paroxysmal atrial fibrillation (HCC) 02/25/2020   Hypertension 02/25/2020   CKD (chronic kidney disease) stage 2, GFR 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/min 02/25/2020   Type 2 diabetes mellitus with complication, without long-term current use of insulin (HCC) 02/25/2020   CAD (coronary artery disease), native coronary artery;DES to OM1 LCX, DES to mid RCA 02/24/20 02/23/2020   Exertional dyspnea    Abnormal nuclear stress test    Impingement syndrome of left shoulder 05/31/2016   Bilateral hearing loss 12/29/2014    Past Surgical History:  Procedure Laterality Date   COLONOSCOPY WITH PROPOFOL N/A 04/21/2021   Procedure: COLONOSCOPY WITH PROPOFOL;  Surgeon: Dolores Frame, MD;  [facility]: [facility];  Service: Gastroenterology;  Laterality: N/A;  7:30   CORONARY STENT INTERVENTION N/A 02/24/2020   Procedure: CORONARY STENT INTERVENTION;  Surgeon: Lennette Bihari, MD;  Location: [facility];  Service: Cardiovascular;  Laterality: N/A;   EYE SURGERY     HERNIA REPAIR  2023   INGUINAL HERNIA REPAIR Right 01/29/2022   Procedure: HERNIA REPAIR INGUINAL ADULT WITH MESH;  Surgeon: Lucretia Roers, MD;  Location: [facility];  Service: General;  Laterality: Right;   LEFT HEART CATH AND CORONARY ANGIOGRAPHY N/A 02/23/2020   Procedure: LEFT HEART CATH AND CORONARY ANGIOGRAPHY;  Surgeon: Lennette Bihari, MD;  Location: [facility];  Service: Cardiovascular;  Laterality: N/A;   TONSILLECTOMY     VASECTOMY         Home Medications    Prior to Admission medications   Medication Sig Start Date End Date Taking? Authorizing Provider  Accu-Chek Softclix  Lancets lancets 1 each by Other route as needed. 12/29/19   [provider]  acetaminophen (TYLENOL) 650 MG CR tablet Take 650 mg by mouth every 8 (eight) hours as needed for pain.    [provider]  apixaban (ELIQUIS) 5 MG TABS tablet Take 5 mg by mouth 2 (two) times daily.    [provider]  atorvastatin (LIPITOR) 80 MG tablet Take 1 tablet (80 mg total) by mouth daily. 02/26/20   Furth, Cadence H, PA-C  B-D 3CC LUER-LOK SYR 22GX1" 22G X 1" 3 ML MISC 1 each by Other route as needed. 07/15/23   Billie Lade, MD  BD DISP NEEDLES 18G X 1-1/2" MISC 1 each by Does not apply route See admin instructions. Use daily 07/15/23   Billie Lade, MD  benazepril (LOTENSIN) 20 MG tablet Take 1 tablet (20 mg total) by mouth daily. 07/15/23   Billie Lade, MD  chlorthalidone (HYGROTON) 25 MG tablet Take 25 mg by mouth daily.    [provider]  cholecalciferol (VITAMIN D3) 25 MCG (1000 UNIT) tablet Take 1,000 Units by mouth daily.    [provider]  clotrimazole-betamethasone (LOTRISONE) cream Apply 1 application  topically daily as needed (Rash). Do not apply to buttocks 08/17/19   [provider]  Coenzyme Q10 (CO Q 10 PO) Take 1 capsule by mouth daily.    [provider]  CONTOUR NEXT TEST test strip 1 each by Other route daily. 10/31/19   [provider]  dutasteride (AVODART) 0.5 MG capsule Take 1 capsule (0.5 mg total) by mouth daily. 06/26/23   McKenzie, Mardene Celeste, MD  empagliflozin (JARDIANCE) 25 MG TABS tablet Take 12.5 mg by mouth daily.    [provider]  famotidine (PEPCID) 10 MG tablet Take 10 mg by mouth daily as needed for heartburn or indigestion.    [provider]  fluticasone (FLONASE) 50 MCG/ACT nasal spray Place 2 sprays into both nostrils daily. 04/13/23   [provider]  ketoconazole (NIZORAL) 2 % cream Apply to skin on bottoms of feet twice daily until peeling resolves (about 3  weeks) 06/07/23   Delories Heinz, DPM  KLOR-CON M10 10 MEQ tablet Take 10 mEq by mouth daily.    [provider]  Misc Natural Products (MENS PROSTATE HEALTH FORMULA PO) Take 1 tablet by mouth in the morning and at bedtime. Prostate Formula: Real Health    [provider]  Misc Natural Products (OSTEO BI-FLEX TRIPLE STRENGTH PO) Take 1 tablet by mouth in the morning and at bedtime.    [provider]  Multiple Vitamins-Minerals (MULTIVITAMIN WITH MINERALS) tablet Take 1 tablet by mouth daily. Men's    [provider]  tadalafil (CIALIS) 10 MG tablet Take 1 tablet (10 mg total) by mouth as needed for erectile dysfunction. 06/26/23  McKenzie, Mardene Celeste, MD  tamsulosin (FLOMAX) 0.4 MG CAPS capsule Take 1 capsule (0.4 mg total) by mouth daily after supper. 06/26/23   McKenzie, Mardene Celeste, MD  testosterone cypionate (DEPOTESTOSTERONE CYPIONATE) 200 MG/ML injection Inject 1 mL (200 mg total) into the muscle every 21 ( twenty-one) days. 07/19/23   Billie Lade, MD  verapamil (CALAN-SR) 120 MG CR tablet TAKE 1 TABLET BY MOUTH EVERYDAY AT BEDTIME 02/04/23   Jonelle Sidle, MD    Family History Family History  Problem Relation Age of Onset   Uterine cancer Mother    Diabetes Mother    Stroke Mother    Hearing loss Mother    Heart disease Mother    Hypertension Mother    Cancer Mother    Heart attack Father    Stroke Father    Heart disease Father     Social History Social History   Tobacco Use   Smoking status: Never   Smokeless tobacco: Never  Vaping Use   Vaping status: Never Used  Substance Use Topics   Alcohol use: No   Drug use: Never     Allergies   Codeine and Farxiga [dapagliflozin]   Review of Systems Review of Systems Per HPI  Physical Exam Triage Vital Signs ED Triage Vitals  Encounter Vitals Group     BP 08/29/23 1459 (!) 171/78     Systolic BP Percentile --      Diastolic BP Percentile --      Pulse Rate 08/29/23  1455 (P) 71     Resp 08/29/23 1455 (P) 18     Temp 08/29/23 1459 97.8 F (36.6 C)     Temp Source 08/29/23 1455 (P) Oral     SpO2 08/29/23 1455 (P) 98 %     Weight --      Height --      Head Circumference --      Peak Flow --      Pain Score 08/29/23 1459 6     Pain Loc --      Pain Education --      Exclude from Growth Chart --    No data found.  Updated Vital Signs BP (!) 171/78 (BP Location: Right Arm)   Pulse (P) 71   Temp 97.8 F (36.6 C) (Oral)   Resp (P) 18   SpO2 (P) 98%   Visual Acuity Right Eye Distance:   Left Eye Distance:   Bilateral Distance:    Right Eye Near:   Left Eye Near:    Bilateral Near:     Physical Exam Vitals and nursing note reviewed.  Constitutional:      General: He is not in acute distress.    Appearance: Normal appearance.  HENT:     Head: Normocephalic.  Eyes:     Extraocular Movements: Extraocular movements intact.     Pupils: Pupils are equal, round, and reactive to light.  Pulmonary:     Effort: Pulmonary effort is normal.  Musculoskeletal:     Cervical back: Normal range of motion.     Left knee: No swelling. Decreased range of motion. Tenderness present over the medial joint line. Normal pulse.  Skin:    General: Skin is warm and dry.  Neurological:     General: No focal deficit present.     Mental Status: He is alert and oriented to person, place, and time.  Psychiatric:        Mood and Affect: Mood normal.  Behavior: Behavior normal.      UC Treatments / Results  Labs (all labs ordered are listed, but only abnormal results are displayed) Labs Reviewed - No data to display  EKG   Radiology DG Knee Complete 4 Views Left Result Date: 08/29/2023 CLINICAL DATA:  left knee pain EXAM: LEFT KNEE - COMPLETE 4+ VIEW COMPARISON:  None Available. FINDINGS: No acute fracture or dislocation. Joint spaces and alignment are relatively maintained. Enthesopathic changes of the patella. No area of erosion or osseous  destruction. No unexpected radiopaque foreign body. Vascular calcifications. IMPRESSION: 1. No acute fracture or dislocation. Electronically Signed   By: Meda Klinefelter M.D.   On: 08/29/2023 15:40    Procedures Procedures (including critical care time)  Medications Ordered in UC Medications - No data to display  Initial Impression / Assessment and Plan / UC Course  I have reviewed the triage vital signs and the nursing notes.  Pertinent labs & imaging results that were available during my care of the patient were reviewed by me and considered in my medical decision making (see chart for details).  X-ray of the left knee is negative for fracture or dislocation.  Suspect a sprain or strain of the tendon or ligament of the knee.  Supportive care recommendations were provided and discussed with the patient to include RICE therapy, and over-the-counter analgesics.  Patient was also prescribed rehab for sprain.  Advised patient that if symptoms have not improved over the next 2 to 3 weeks, do recommend following up with orthopedics for further evaluation.  Patient was in agreement with this plan of care and verbalized understanding.  All questions were answered.  Patient stable for discharge.  Final Clinical Impressions(s) / UC Diagnoses   Final diagnoses:  Acute pain of left knee  Sprain of left knee, unspecified ligament, initial encounter     Discharge Instructions      The x-ray is negative for fracture or dislocation.  Suspect you may have strained a tendon or ligament in the knee. Continue Tylenol as needed for pain or discomfort. RICE therapy rest, ice, compression, and elevation.  Apply ice for 20 minutes, remove for 1 hour, repeat as needed. Gentle stretching and range of motion exercises while symptoms persist. Recommend refraining from strenuous activity or prolonged activity for the pain. If symptoms have not improved over the next 2 to 3 weeks, recommend following up with  orthopedics for further evaluation.  I have given you information for [facility] and for [facility] for follow-up. Follow-up as needed.      ED Prescriptions   None    PDMP not reviewed this encounter.   Abran Cantor, NP 08/29/23 1552

## 2023-08-29 NOTE — Discharge Instructions (Addendum)
The x-ray is negative for fracture or dislocation.  Suspect you may have strained a tendon or ligament in the knee. Continue Tylenol as needed for pain or discomfort. RICE therapy rest, ice, compression, and elevation.  Apply ice for 20 minutes, remove for 1 hour, repeat as needed. Gentle stretching and range of motion exercises while symptoms persist. Recommend refraining from strenuous activity or prolonged activity for the pain. If symptoms have not improved over the next 2 to 3 weeks, recommend following up with orthopedics for further evaluation.  I have given you information for [facility] and for [facility] for follow-up. Follow-up as needed.

## 2023-09-05 ENCOUNTER — Ambulatory Visit: Payer: Medicare Other | Admitting: Podiatry

## 2023-09-05 ENCOUNTER — Encounter: Payer: Self-pay | Admitting: Podiatry

## 2023-09-05 DIAGNOSIS — B351 Tinea unguium: Secondary | ICD-10-CM | POA: Diagnosis not present

## 2023-09-05 DIAGNOSIS — M79609 Pain in unspecified limb: Secondary | ICD-10-CM

## 2023-09-05 NOTE — Progress Notes (Signed)
Subjective: Chief Complaint  Patient presents with   [facility]    RM#13 [facility] patient has no other concerns at this time.    77 year old male presents the office with above concerns.  States his nails are thickened elongated he cannot trim them himself and causing discomfort.  No open lesions.  No other concerns.  Last A1c was 7.5 on 12/04/2022  Billie Lade, MD Last seen 06/04/2023  Objective: AAO x3, NAD DP/PT pulses palpable bilaterally, CRT less than 3 seconds Nails are hypertrophic, dystrophic, brittle, discolored, elongated 10. No surrounding redness or drainage. Tenderness nails 1-5 bilaterally. No open lesions or pre-ulcerative lesions are identified today. No pain with calf compression, swelling, warmth, erythema  Assessment: Symptomatic onychomycosis  Plan: -All treatment options discussed with the patient including all alternatives, risks, complications.  -Nails sharply debrided x 10 without complications or bleeding.  -Patient encouraged to call the office with any questions, concerns, change in symptoms.   Vivi Barrack DPM

## 2023-09-06 ENCOUNTER — Ambulatory Visit: Payer: Medicare Other | Admitting: Podiatry

## 2023-09-17 ENCOUNTER — Encounter: Payer: Self-pay | Admitting: Orthopedic Surgery

## 2023-09-17 ENCOUNTER — Ambulatory Visit (INDEPENDENT_AMBULATORY_CARE_PROVIDER_SITE_OTHER): Payer: Medicare Other | Admitting: Orthopedic Surgery

## 2023-09-17 VITALS — BP 127/71 | HR 78 | Ht 73.0 in | Wt 182.1 lb

## 2023-09-17 DIAGNOSIS — M25562 Pain in left knee: Secondary | ICD-10-CM | POA: Diagnosis not present

## 2023-09-17 NOTE — Patient Instructions (Signed)

## 2023-09-17 NOTE — Progress Notes (Signed)
 New Patient Visit  Assessment: Kenneth Trainer. is a 77 y.o. male with the following: 1. Acute pain of left knee   Plan: Kenneth Margarie Raddle. has been having some pain in the medial aspect of the left knee for several months.  He had acute worsening recently.  Radiographs demonstrate some mild degenerative changes.  He has tenderness along the medial joint line.  He also has pain in the medial compartment with stress of the meniscus.  He may have sustained an injury to the meniscus, however he did not have associated swelling following the injury.  He states that is improving.  As such, he will continue with his current plan of activities as tolerated, Tylenol  and ice.  He should consider topical treatments, as well as a brace.  If he continues to have issues, I would recommend he consider an injection.  He is comfortable with this plan.  He will return to clinic as needed.  Follow-up: Return if symptoms worsen or fail to improve.  Subjective:  Chief Complaint  Patient presents with   Knee Pain    L/ hurting inside of knee. Icing it and that helps. Urgent Care took x-rays.     History of Present Illness: Kenneth Metoyer. is a 77 y.o. male who presents for evaluation of left knee pain.  He states he started to have pain in the medial aspect of the left knee several months ago.  He got out of the shower, and bent his knee, had a sharp pain.  This bothered him for little bit, but gradually went away.  More recently, he was playing golf.  After shot, he did not feel anything but started to walk away.  He had sudden onset of pain in the medial aspect of the knee.  This was a few weeks ago.  He presented to an urgent care center.  Radiographs were negative.  He continues take Tylenol , and ice.  He has restricted his activities.  He states that is improving.  He feels much better.  He has not been taking Tylenol  for couple days now.  No prior injections.  He has not worked with therapy.  No prior injuries to  his left knee.   Review of Systems: No fevers or chills No numbness or tingling No chest pain No shortness of breath No bowel or bladder dysfunction No GI distress No headaches   Medical History:  Past Medical History:  Diagnosis Date   Allergy 2012   Tree pollen   Arthritis 2019   some in the fingers   BPH (benign prostatic hyperplasia)    CAD (coronary artery disease)    a. s/p DES to OM1 and DES to RCA in 02/2020   Cataract 2014   cataracts surgery for both   CKD (chronic kidney disease) stage 3, GFR 30-59 ml/min (HCC)    Clotting disorder (HCC) 2017   take blood thinner   GERD (gastroesophageal reflux disease)    Hyperlipidemia    Hypertension    Persistent atrial fibrillation (HCC)    Pinched nerve in neck    Type 2 diabetes mellitus (HCC)     Past Surgical History:  Procedure Laterality Date   COLONOSCOPY WITH PROPOFOL  N/A 04/21/2021   Procedure: COLONOSCOPY WITH PROPOFOL ;  Surgeon: Eartha Angelia Sieving, MD;  [facility];  Service: Gastroenterology;  Laterality: N/A;  7:30   CORONARY STENT INTERVENTION N/A 02/24/2020   Procedure: CORONARY STENT INTERVENTION;  Surgeon: Burnard Debby LABOR, MD;  [facility];  Service: Cardiovascular;  Laterality: N/A;   EYE SURGERY     HERNIA REPAIR  2023   INGUINAL HERNIA REPAIR Right 01/29/2022   Procedure: HERNIA REPAIR INGUINAL ADULT WITH MESH;  Surgeon: Kallie Manuelita BROCKS, MD;  Location: [facility];  Service: General;  Laterality: Right;   LEFT HEART CATH AND CORONARY ANGIOGRAPHY N/A 02/23/2020   Procedure: LEFT HEART CATH AND CORONARY ANGIOGRAPHY;  Surgeon: Burnard Debby LABOR, MD;  [facility];  Service: Cardiovascular;  Laterality: N/A;   TONSILLECTOMY     VASECTOMY      Family History  Problem Relation Age of Onset   Uterine cancer Mother    Diabetes Mother    Stroke Mother    Hearing loss Mother    Heart disease Mother    Hypertension Mother    Cancer Mother    Heart  attack Father    Stroke Father    Heart disease Father    Social History   Tobacco Use   Smoking status: Never   Smokeless tobacco: Never  Vaping Use   Vaping status: Never Used  Substance Use Topics   Alcohol use: No   Drug use: Never    Allergies  Allergen Reactions   Codeine Nausea And Vomiting   Farxiga [Dapagliflozin]     Weakness, light headed    Current Meds  Medication Sig   Accu-Chek Softclix Lancets lancets 1 each by Other route as needed.   acetaminophen  (TYLENOL ) 650 MG CR tablet Take 650 mg by mouth every 8 (eight) hours as needed for pain.   apixaban (ELIQUIS) 5 MG TABS tablet Take 5 mg by mouth 2 (two) times daily.   atorvastatin  (LIPITOR ) 80 MG tablet Take 1 tablet (80 mg total) by mouth daily.   B-D 3CC LUER-LOK SYR 22GX1 22G X 1 3 ML MISC 1 each by Other route as needed.   BD DISP NEEDLES 18G X 1-1/2 MISC 1 each by Does not apply route See admin instructions. Use daily   benazepril  (LOTENSIN ) 20 MG tablet Take 1 tablet (20 mg total) by mouth daily.   chlorthalidone  (HYGROTON ) 25 MG tablet Take 25 mg by mouth daily.   cholecalciferol (VITAMIN D3) 25 MCG (1000 UNIT) tablet Take 1,000 Units by mouth daily.   clotrimazole-betamethasone (LOTRISONE) cream Apply 1 application  topically daily as needed (Rash). Do not apply to buttocks   Coenzyme Q10 (CO Q 10 PO) Take 1 capsule by mouth daily.   CONTOUR NEXT TEST test strip 1 each by Other route daily.   dutasteride  (AVODART ) 0.5 MG capsule Take 1 capsule (0.5 mg total) by mouth daily.   empagliflozin  (JARDIANCE ) 25 MG TABS tablet Take 12.5 mg by mouth daily.   famotidine (PEPCID) 10 MG tablet Take 10 mg by mouth daily as needed for heartburn or indigestion.   fluticasone (FLONASE) 50 MCG/ACT nasal spray Place 2 sprays into both nostrils daily.   ketoconazole  (NIZORAL ) 2 % cream Apply to skin on bottoms of feet twice daily until peeling resolves (about 3 weeks)   KLOR-CON M10 10 MEQ tablet Take 10 mEq by mouth  daily.   Misc Natural Products (MENS PROSTATE HEALTH FORMULA PO) Take 1 tablet by mouth in the morning and at bedtime. Prostate Formula: Real Health   Misc Natural Products (OSTEO BI-FLEX TRIPLE STRENGTH PO) Take 1 tablet by mouth in the morning and at bedtime.   Multiple Vitamins-Minerals (MULTIVITAMIN WITH MINERALS) tablet Take 1 tablet by mouth daily.  Men's   tadalafil  (CIALIS ) 10 MG tablet Take 1 tablet (10 mg total) by mouth as needed for erectile dysfunction.   tamsulosin  (FLOMAX ) 0.4 MG CAPS capsule Take 1 capsule (0.4 mg total) by mouth daily after supper.   testosterone  cypionate (DEPOTESTOSTERONE CYPIONATE) 200 MG/ML injection Inject 1 mL (200 mg total) into the muscle every 21 ( twenty-one) days.   verapamil  (CALAN -SR) 120 MG CR tablet TAKE 1 TABLET BY MOUTH EVERYDAY AT BEDTIME    Objective: BP 127/71   Pulse 78   Ht 6' 1 (1.854 m)   Wt 182 lb 2 oz (82.6 kg)   BMI 24.03 kg/m   Physical Exam:  General: Alert and oriented. and No acute distress. Gait: Normal gait.  Left knee without swelling.  No redness.  Tenderness to palpation along the medial joint line.  No tenderness to palpation along the medial femoral condyle, or medial tibial plateau.  Pain in the medial knee with hyperflexion.  Negative Lachman.  No increased laxity to varus or valgus stress.  He has good range of motion in the left knee.  IMAGING: I personally reviewed images previously obtained from the ED  X-rays of the left knee were previously obtained.  No acute injuries.  Mild loss of joint space in the medial compartment.  Enthesophytes off the proximal and distal pole of the patella.   New Medications:  No orders of the defined types were placed in this encounter.     Oneil DELENA Horde, MD  09/17/2023 9:37 AM

## 2023-10-04 ENCOUNTER — Ambulatory Visit (INDEPENDENT_AMBULATORY_CARE_PROVIDER_SITE_OTHER): Payer: Medicare Other | Admitting: Internal Medicine

## 2023-10-04 ENCOUNTER — Encounter: Payer: Self-pay | Admitting: Internal Medicine

## 2023-10-04 VITALS — BP 109/73 | HR 64 | Ht 73.0 in | Wt 187.0 lb

## 2023-10-04 DIAGNOSIS — Z7984 Long term (current) use of oral hypoglycemic drugs: Secondary | ICD-10-CM | POA: Diagnosis not present

## 2023-10-04 DIAGNOSIS — E118 Type 2 diabetes mellitus with unspecified complications: Secondary | ICD-10-CM | POA: Diagnosis not present

## 2023-10-04 DIAGNOSIS — R195 Other fecal abnormalities: Secondary | ICD-10-CM

## 2023-10-04 NOTE — Progress Notes (Unsigned)
Established Patient Office Visit  Subjective   Patient ID: Kenneth Moreno., male    DOB: 17-Feb-1946  Age: 78 y.o. MRN: [id number]  Chief Complaint  Patient presents with   Diabetes    Follow up    lose bowels    Lose bowels for six month   Kenneth Watkins returns to care today for follow-up.  He was last evaluated by me in September 2024.  No medication changes were made at that time and 59-month follow-up was arranged.  In the interim, he has been evaluated by podiatry, urology, and orthopedic surgery.  Urgent care presentation on 08/29/23 in the setting of left knee pain.  He was referred to orthopedic surgery and was evaluated by Dr. Dallas Schimke on 12/31.  There have otherwise been no acute interval events.  Past Medical History:  Diagnosis Date   Allergy 2012   Tree pollen   Arthritis 2019   some in the fingers   BPH (benign prostatic hyperplasia)    CAD (coronary artery disease)    a. s/p DES to OM1 and DES to RCA in 02/2020   Cataract 2014   cataracts surgery for both   CKD (chronic kidney disease) stage 3, GFR 30-59 ml/min (HCC)    Clotting disorder (HCC) 2017   take blood thinner   GERD (gastroesophageal reflux disease)    Hyperlipidemia    Hypertension    Persistent atrial fibrillation (HCC)    Pinched nerve in neck    Type 2 diabetes mellitus (HCC)    Past Surgical History:  Procedure Laterality Date   COLONOSCOPY WITH PROPOFOL N/A 04/21/2021   Procedure: COLONOSCOPY WITH PROPOFOL;  Surgeon: Dolores Frame, MD;  [facility];  Service: Gastroenterology;  Laterality: N/A;  7:30   CORONARY STENT INTERVENTION N/A 02/24/2020   Procedure: CORONARY STENT INTERVENTION;  Surgeon: Lennette Bihari, MD;  [facility];  Service: Cardiovascular;  Laterality: N/A;   EYE SURGERY     HERNIA REPAIR  2023   INGUINAL HERNIA REPAIR Right 01/29/2022   Procedure: HERNIA REPAIR INGUINAL ADULT WITH MESH;  Surgeon: Lucretia Roers, MD;  Location: [facility];   Service: General;  Laterality: Right;   LEFT HEART CATH AND CORONARY ANGIOGRAPHY N/A 02/23/2020   Procedure: LEFT HEART CATH AND CORONARY ANGIOGRAPHY;  Surgeon: Lennette Bihari, MD;  [facility];  Service: Cardiovascular;  Laterality: N/A;   TONSILLECTOMY     VASECTOMY     Social History   Tobacco Use   Smoking status: Never   Smokeless tobacco: Never  Vaping Use   Vaping status: Never Used  Substance Use Topics   Alcohol use: No   Drug use: Never   Family History  Problem Relation Age of Onset   Uterine cancer Mother    Diabetes Mother    Stroke Mother    Hearing loss Mother    Heart disease Mother    Hypertension Mother    Cancer Mother    Heart attack Father    Stroke Father    Heart disease Father    Allergies  Allergen Reactions   Codeine Nausea And Vomiting   Farxiga [Dapagliflozin]     Weakness, light headed   ROS    Objective:     BP 109/73 (BP Location: Left Arm, Patient Position: Sitting, Cuff Size: Normal)   Pulse 64   Ht 6\' 1"  (1.854 m)   Wt 187 lb (84.8 kg)   SpO2 98%  BMI 24.67 kg/m  BP Readings from Last 3 Encounters:  10/04/23 109/73  09/17/23 127/71  08/29/23 (!) 171/78   Physical Exam  Last CBC Lab Results  Component Value Date   WBC 5.3 03/06/2023   HGB 17.2 03/06/2023   HCT 50.9 03/06/2023   MCV 90 03/06/2023   MCH 30.6 03/06/2023   RDW 13.3 03/06/2023   PLT 184 03/06/2023   Last metabolic panel Lab Results  Component Value Date   GLUCOSE 114 (H) 03/06/2023   NA 140 03/06/2023   K 3.9 03/06/2023   CL 100 03/06/2023   CO2 24 03/06/2023   BUN 26 03/06/2023   CREATININE 1.53 (H) 03/06/2023   EGFR 47 (L) 03/06/2023   CALCIUM 9.7 03/06/2023   PROT 6.7 03/06/2023   ALBUMIN 4.4 03/06/2023   LABGLOB 2.3 03/06/2023   BILITOT 0.9 03/06/2023   ALKPHOS 80 03/06/2023   AST 25 03/06/2023   ALT 32 03/06/2023   ANIONGAP 7 01/26/2022   Last lipids Lab Results  Component Value Date   CHOL 151 03/06/2023    HDL 37 (L) 03/06/2023   LDLCALC 85 03/06/2023   TRIG 170 (H) 03/06/2023   CHOLHDL 4.1 03/06/2023   Last hemoglobin A1c Lab Results  Component Value Date   HGBA1C 7.5 (H) 03/06/2023   Last thyroid functions Lab Results  Component Value Date   TSH 4.980 (H) 03/06/2023   Last vitamin B12 and Folate Lab Results  Component Value Date   VITAMINB12 946 03/06/2023   FOLATE >20.0 03/06/2023   The 10-year ASCVD risk score (Arnett DK, et al., 2019) is: 38.5%    Assessment & Plan:   Problem List Items Addressed This Visit   None   No follow-ups on file.    Billie Lade, MD

## 2023-10-04 NOTE — Patient Instructions (Signed)
It was a pleasure to see you today.  Thank you for giving [place] the opportunity to be involved in your care.  Below is a brief recap of your visit and next steps.  We will plan to see you again in 6 months.  Summary Repeat A1c ordered today Try a probiotic for loose bowel Follow up in 6 months

## 2023-10-07 ENCOUNTER — Other Ambulatory Visit: Payer: Self-pay | Admitting: Internal Medicine

## 2023-10-07 ENCOUNTER — Encounter: Payer: Self-pay | Admitting: Internal Medicine

## 2023-10-07 DIAGNOSIS — R195 Other fecal abnormalities: Secondary | ICD-10-CM | POA: Insufficient documentation

## 2023-10-07 LAB — BAYER DCA HB A1C WAIVED: HB A1C (BAYER DCA - WAIVED): 7.7 % — ABNORMAL HIGH (ref 4.8–5.6)

## 2023-10-07 MED ORDER — EMPAGLIFLOZIN 25 MG PO TABS: 25.0000 mg | ORAL_TABLET | Freq: Every day | ORAL | 2 refills | Status: AC

## 2023-10-07 NOTE — Assessment & Plan Note (Signed)
He endorses nonbloody, loose stools intermittently x 6 months.  Recommend increasing daily fiber intake and consider adding a probiotic.  He will return to care if symptoms worsen or fail to improve.

## 2023-10-07 NOTE — Assessment & Plan Note (Signed)
A1c 7.5 on labs from June.  He is currently prescribed Jardiance 12.5 mg daily.  He has focused on dietary changes in an effort to improve diabetes control. -Repeat A1c ordered today.  Increase Jardiance to 25 mg daily if A1c remains above goal.

## 2023-10-10 ENCOUNTER — Other Ambulatory Visit: Payer: Self-pay | Admitting: Internal Medicine

## 2023-11-07 ENCOUNTER — Encounter: Payer: Self-pay | Admitting: Internal Medicine

## 2023-11-07 ENCOUNTER — Ambulatory Visit: Payer: Medicare Other | Attending: Cardiology | Admitting: Cardiology

## 2023-11-07 ENCOUNTER — Encounter: Payer: Self-pay | Admitting: Cardiology

## 2023-11-07 VITALS — BP 114/60 | HR 75 | Ht 73.0 in | Wt 185.6 lb

## 2023-11-07 DIAGNOSIS — I10 Essential (primary) hypertension: Secondary | ICD-10-CM | POA: Diagnosis present

## 2023-11-07 DIAGNOSIS — I480 Paroxysmal atrial fibrillation: Secondary | ICD-10-CM | POA: Diagnosis not present

## 2023-11-07 DIAGNOSIS — I25119 Atherosclerotic heart disease of native coronary artery with unspecified angina pectoris: Secondary | ICD-10-CM | POA: Diagnosis present

## 2023-11-07 DIAGNOSIS — E782 Mixed hyperlipidemia: Secondary | ICD-10-CM | POA: Diagnosis present

## 2023-11-07 NOTE — Progress Notes (Signed)
 Cardiology Office Note  Date: 11/07/2023   ID: Kenneth Araki., DOB August 08, 1946, MRN [id number]  History of Present Illness: Kenneth Bultman. is a 78 y.o. male last seen in August 2024.  He is here for a routine visit.  Remains active with ADLs, no escalating angina at this point and no substantial palpitations.  He does rarely get an alert from his Apple Watch regarding atrial fibrillation which tends to be fairly brief.  I reviewed his medications.  Current regimen includes Eliquis 5 mg twice daily, Lipitor 80 mg daily, Lotensin 20 mg daily, chlorthalidone 25 mg daily, Jardiance 25 mg daily, KCl 10 mEq daily, and Calan SR 120 mg daily.  He does not report any stool changes or spontaneous bleeding problems.  I reviewed his ECG today which shows sinus rhythm with prolonged PR interval, PVC.  Reports having recent lab work through the [facility] system, plans to send this to me for review.  Physical Exam: VS:  BP 114/60   Pulse 75   Ht 6\' 1"  (1.854 m)   Wt 185 lb 9.6 oz (84.2 kg)   SpO2 98%   BMI 24.49 kg/m , BMI Body mass index is 24.49 kg/m.  Wt Readings from Last 3 Encounters:  11/07/23 185 lb 9.6 oz (84.2 kg)  10/04/23 187 lb (84.8 kg)  09/17/23 182 lb 2 oz (82.6 kg)    General: Patient appears comfortable at rest. HEENT: Conjunctiva and lids normal. Neck: Supple, no elevated JVP or carotid bruits. Lungs: Clear to auscultation, nonlabored breathing at rest. Cardiac: Regular rate and rhythm, no S3 or significant systolic murmur. Extremities: No pitting edema.  ECG:  An ECG dated 10/02/2022 was personally reviewed today and demonstrated:  Sinus rhythm with prolonged PR interval.  Labwork: 03/06/2023: ALT 32; AST 25; BUN 26; Creatinine, Ser 1.53; Hemoglobin 17.2; Platelets 184; Potassium 3.9; Sodium 140; TSH 4.980     Component Value Date/Time   CHOL 151 03/06/2023 0943   TRIG 170 (H) 03/06/2023 0943   HDL 37 (L) 03/06/2023 0943   CHOLHDL 4.1 03/06/2023 0943   CHOLHDL  4.0 08/17/2020 1030   VLDL 29 02/24/2020 0411   LDLCALC 85 03/06/2023 0943   LDLCALC 81 08/17/2020 1030  September 2024: BUN 19, creatinine 1.61, GFR 44, potassium 4, hemoglobin 16.5, platelets 188 January 2025: Hemoglobin A1c 7.7%  Other Studies Reviewed Today:  No interval cardiac testing for review today.  Assessment and Plan:  1.  CAD status post DES to the OM1 and DES to the RCA in June 2021.  No escalating angina on medical therapy.  Plan to continue observation at this point.  Not on aspirin given concurrent use of Eliquis.  Also on Jardiance and Lipitor at above doses, no changes were made.   2.  Paroxysmal atrial fibrillation with CHA2DS2-VASc score of 4.  Symptomatically quite stable at this point.  He remains on Eliquis for stroke prophylaxis, also on Calan SR at dose indicated above.   3.  CKD stage IIIb.  Creatinine 1.61 with GFR 44 in September 2024.   4.  Primary hypertension.  Blood pressure control is good today, no changes were made,   5.  Mixed hyperlipidemia.  LDL 85 in June 2024.  He is on Lipitor 80 mg daily.  Plan to review more recent lipid panel.  May be a good candidate for addition of Zetia.  Disposition:  Follow up  6 months.  Signed, Jonelle Sidle, M.D., F.A.C.C. [facility] at  [facility]

## 2023-11-07 NOTE — Patient Instructions (Addendum)

## 2023-11-16 ENCOUNTER — Other Ambulatory Visit: Payer: Self-pay | Admitting: Internal Medicine

## 2023-11-16 ENCOUNTER — Encounter: Payer: Self-pay | Admitting: Internal Medicine

## 2023-11-18 MED ORDER — BENAZEPRIL HCL 20 MG PO TABS: 20.0000 mg | ORAL_TABLET | Freq: Every day | ORAL | 0 refills | Status: DC

## 2023-11-18 MED ORDER — CHLORTHALIDONE 25 MG PO TABS
25.0000 mg | ORAL_TABLET | Freq: Every day | ORAL | 1 refills | Status: DC
Start: 2023-11-18 — End: 2024-05-21

## 2023-11-18 MED ORDER — BD LUER-LOK SYRINGE 22G X 1" 3 ML MISC: 1.0000 | 0 refills | Status: AC | PRN

## 2023-11-19 ENCOUNTER — Other Ambulatory Visit: Payer: Self-pay

## 2023-11-19 MED ORDER — ATORVASTATIN CALCIUM 80 MG PO TABS: 80.0000 mg | ORAL_TABLET | Freq: Every day | ORAL | 3 refills | Status: DC

## 2023-11-19 MED ORDER — VERAPAMIL HCL ER 120 MG PO TBCR: 120.0000 mg | EXTENDED_RELEASE_TABLET | Freq: Every day | ORAL | 3 refills | Status: DC

## 2023-11-19 NOTE — Telephone Encounter (Signed)
 Refills sent to pharmacy.

## 2023-12-05 ENCOUNTER — Encounter: Payer: Self-pay | Admitting: Podiatry

## 2023-12-05 ENCOUNTER — Ambulatory Visit (INDEPENDENT_AMBULATORY_CARE_PROVIDER_SITE_OTHER): Payer: Medicare Other | Admitting: Podiatry

## 2023-12-05 DIAGNOSIS — B351 Tinea unguium: Secondary | ICD-10-CM

## 2023-12-05 DIAGNOSIS — M79609 Pain in unspecified limb: Secondary | ICD-10-CM

## 2023-12-08 NOTE — Progress Notes (Signed)
 Subjective: Chief Complaint  Patient presents with   [facility]    RM#13 [facility] patient has no concerns at this time.   78 year old male presents the office with above concerns.  States his nails are thickened elongated he cannot trim them himself and causing discomfort.  No open lesions.  No other concerns.  Last A1c was 7.7 on 10/04/2023  Billie Lade, MD Last seen 06/04/2023  Objective: AAO x3, NAD DP/PT pulses palpable bilaterally, CRT less than 3 seconds Nails are hypertrophic, dystrophic, brittle, discolored, elongated 10. No surrounding redness or drainage. Tenderness nails 1-5 bilaterally. No open lesions or pre-ulcerative lesions are identified today. No pain with calf compression, swelling, warmth, erythema  Assessment: Symptomatic onychomycosis  Plan: -All treatment options discussed with the patient including all alternatives, risks, complications.  -Nails sharply debrided x 10 without complications or bleeding.  -Patient encouraged to call the office with any questions, concerns, change in symptoms.    Return in about 9 weeks (around 02/06/2024).  Vivi Barrack DPM

## 2023-12-10 ENCOUNTER — Other Ambulatory Visit: Payer: Self-pay | Admitting: Internal Medicine

## 2023-12-18 ENCOUNTER — Other Ambulatory Visit: Payer: Medicare Other

## 2023-12-18 DIAGNOSIS — N138 Other obstructive and reflux uropathy: Secondary | ICD-10-CM

## 2023-12-19 LAB — PSA: Prostate Specific Ag, Serum: 1.1 ng/mL (ref 0.0–4.0)

## 2023-12-25 ENCOUNTER — Ambulatory Visit: Payer: Medicare Other | Admitting: Urology

## 2023-12-25 ENCOUNTER — Encounter: Payer: Self-pay | Admitting: Urology

## 2023-12-25 VITALS — BP 147/67 | HR 76

## 2023-12-25 DIAGNOSIS — N138 Other obstructive and reflux uropathy: Secondary | ICD-10-CM | POA: Diagnosis not present

## 2023-12-25 DIAGNOSIS — N5201 Erectile dysfunction due to arterial insufficiency: Secondary | ICD-10-CM | POA: Diagnosis not present

## 2023-12-25 DIAGNOSIS — R339 Retention of urine, unspecified: Secondary | ICD-10-CM | POA: Diagnosis not present

## 2023-12-25 DIAGNOSIS — N401 Enlarged prostate with lower urinary tract symptoms: Secondary | ICD-10-CM | POA: Diagnosis not present

## 2023-12-25 MED ORDER — TAMSULOSIN HCL 0.4 MG PO CAPS: 0.4000 mg | ORAL_CAPSULE | Freq: Every day | ORAL | 11 refills | Status: DC

## 2023-12-25 MED ORDER — TADALAFIL 10 MG PO TABS: 10.0000 mg | ORAL_TABLET | ORAL | 5 refills | Status: AC | PRN

## 2023-12-25 MED ORDER — DUTASTERIDE 0.5 MG PO CAPS: 0.5000 mg | ORAL_CAPSULE | Freq: Every day | ORAL | 11 refills | Status: AC

## 2023-12-25 NOTE — Patient Instructions (Signed)

## 2023-12-25 NOTE — Progress Notes (Signed)
 12/25/2023 1:40 PM   Kenneth Watkins. 1946/01/10 [id number]  Referring provider: Tobi Fortes, MD [address] [place],  [place] [address]  Followup BPH   HPI: Kenneth Watkins is a 78yo here for followup for BPH and erectile dysfunction. IPSS 9 QOL 1 on flomax  0.4mg  daily and dutasteride  0.5mg  daily. Nocturia 1-3x. Urine stream strong. No straining to urinate. He uses tadalafil  and VED with good results. PSa 1.1   PMH: Past Medical History:  Diagnosis Date   Allergy 2012   Tree pollen   Arthritis 2019   some in the fingers   BPH (benign prostatic hyperplasia)    CAD (coronary artery disease)    a. s/p DES to OM1 and DES to RCA in 02/2020   Cataract 2014   cataracts surgery for both   CKD (chronic kidney disease) stage 3, GFR 30-59 ml/min (HCC)    Clotting disorder (HCC) 2017   take blood thinner   GERD (gastroesophageal reflux disease)    Hyperlipidemia    Hypertension    Persistent atrial fibrillation (HCC)    Pinched nerve in neck    Type 2 diabetes mellitus (HCC)     Surgical History: Past Surgical History:  Procedure Laterality Date   COLONOSCOPY WITH PROPOFOL  N/A 04/21/2021   Procedure: COLONOSCOPY WITH PROPOFOL ;  Surgeon: Urban Garden, MD;  [facility];  Service: Gastroenterology;  Laterality: N/A;  7:30   CORONARY STENT INTERVENTION N/A 02/24/2020   Procedure: CORONARY STENT INTERVENTION;  Surgeon: Millicent Ally, MD;  [facility];  Service: Cardiovascular;  Laterality: N/A;   EYE SURGERY     HERNIA REPAIR  2023   INGUINAL HERNIA REPAIR Right 01/29/2022   Procedure: HERNIA REPAIR INGUINAL ADULT WITH MESH;  Surgeon: Awilda Bogus, MD;  Location: [facility];  Service: General;  Laterality: Right;   LEFT HEART CATH AND CORONARY ANGIOGRAPHY N/A 02/23/2020   Procedure: LEFT HEART CATH AND CORONARY ANGIOGRAPHY;  Surgeon: Millicent Ally, MD;  [facility];  Service: Cardiovascular;  Laterality: N/A;    TONSILLECTOMY     VASECTOMY      Home Medications:  Allergies as of 12/25/2023       Reactions   Codeine Nausea And Vomiting   Farxiga [dapagliflozin]    Weakness, light headed        Medication List        Accurate as of December 25, 2023  1:40 PM. If you have any questions, ask your nurse or doctor.          Accu-Chek Softclix Lancets lancets 1 each by Other route as needed.   acetaminophen  650 MG CR tablet Commonly known as: TYLENOL  Take 650 mg by mouth every 8 (eight) hours as needed for pain.   apixaban 5 MG Tabs tablet Commonly known as: ELIQUIS Take 5 mg by mouth 2 (two) times daily.   atorvastatin  80 MG tablet Commonly known as: LIPITOR  Take 1 tablet (80 mg total) by mouth daily.   B-D 3CC LUER-LOK SYR 22GX1" 22G X 1" 3 ML Misc Generic drug: SYRINGE-NEEDLE (DISP) 3 ML 1 each by Other route as needed.   BD Disp Needles 18G X 1-1/2" Misc Generic drug: NEEDLE (DISP) 18 G 1 each by Does not apply route See admin instructions. Use daily   benazepril  20 MG tablet Commonly known as: LOTENSIN  Take 1 tablet (20 mg total) by mouth daily.   chlorthalidone  25 MG tablet Commonly  known as: HYGROTON  Take 1 tablet (25 mg total) by mouth daily. Take 25 mg by mouth daily.   cholecalciferol 25 MCG (1000 UNIT) tablet Commonly known as: VITAMIN D3 Take 1,000 Units by mouth daily.   clotrimazole-betamethasone cream Commonly known as: LOTRISONE Apply 1 application  topically daily as needed (Rash). Do not apply to buttocks   CO Q 10 PO Take 1 capsule by mouth daily.   Contour Next Test test strip Generic drug: glucose blood 1 each by Other route daily.   dutasteride  0.5 MG capsule Commonly known as: AVODART  Take 1 capsule (0.5 mg total) by mouth daily.   empagliflozin  25 MG Tabs tablet Commonly known as: Jardiance  Take 1 tablet (25 mg total) by mouth daily.   famotidine 10 MG tablet Commonly known as: PEPCID Take 10 mg by mouth daily as needed for heartburn  or indigestion.   fluticasone 50 MCG/ACT nasal spray Commonly known as: FLONASE Place 2 sprays into both nostrils daily.   ketoconazole  2 % cream Commonly known as: NIZORAL  Apply to skin on bottoms of feet twice daily until peeling resolves (about 3 weeks)   Klor-Con M10 10 MEQ tablet Generic drug: potassium chloride Take 10 mEq by mouth daily.   MENS PROSTATE HEALTH FORMULA PO Take 1 tablet by mouth in the morning and at bedtime. Prostate Formula: Real Health   OSTEO BI-FLEX TRIPLE STRENGTH PO Take 1 tablet by mouth in the morning and at bedtime.   multivitamin with minerals tablet Take 1 tablet by mouth daily. Men's   tadalafil  10 MG tablet Commonly known as: CIALIS  Take 1 tablet (10 mg total) by mouth as needed for erectile dysfunction.   tamsulosin  0.4 MG Caps capsule Commonly known as: FLOMAX  Take 1 capsule (0.4 mg total) by mouth daily after supper.   testosterone  cypionate 200 MG/ML injection Commonly known as: DEPOTESTOSTERONE CYPIONATE INJECT 1 ML (200 MG TOTAL) INTO THE MUSCLE EVERY 21 ( TWENTY-ONE) DAYS.   verapamil  120 MG CR tablet Commonly known as: CALAN -SR Take 1 tablet (120 mg total) by mouth at bedtime.        Allergies:  Allergies  Allergen Reactions   Codeine Nausea And Vomiting   Farxiga [Dapagliflozin]     Weakness, light headed    Family History: Family History  Problem Relation Age of Onset   Uterine cancer Mother    Diabetes Mother    Stroke Mother    Hearing loss Mother    Heart disease Mother    Hypertension Mother    Cancer Mother    Heart attack Father    Stroke Father    Heart disease Father     Social History:  reports that he has never smoked. He has never used smokeless tobacco. He reports that he does not drink alcohol and does not use drugs.  ROS: All other review of systems were reviewed and are negative except what is noted above in HPI  Physical Exam: BP (!) 147/67   Pulse 76   Constitutional:  Alert and  oriented, No acute distress. HEENT: [place] AT, moist mucus membranes.  Trachea midline, no masses. Cardiovascular: No clubbing, cyanosis, or edema. Respiratory: Normal respiratory effort, no increased work of breathing. GI: Abdomen is soft, nontender, nondistended, no abdominal masses GU: No CVA tenderness.  Lymph: No cervical or inguinal lymphadenopathy. Skin: No rashes, bruises or suspicious lesions. Neurologic: Grossly intact, no focal deficits, moving all 4 extremities. Psychiatric: Normal mood and affect.  Laboratory Data: Lab Results  Component Value  Date   WBC 5.3 03/06/2023   HGB 17.2 03/06/2023   HCT 50.9 03/06/2023   MCV 90 03/06/2023   PLT 184 03/06/2023    Lab Results  Component Value Date   CREATININE 1.53 (H) 03/06/2023    No results found for: "PSA"  Lab Results  Component Value Date   TESTOSTERONE  271 03/06/2023    Lab Results  Component Value Date   HGBA1C 7.7 (H) 10/04/2023    Urinalysis    Component Value Date/Time   APPEARANCEUR Clear 06/26/2023 1346   GLUCOSEU 3+ (A) 06/26/2023 1346   BILIRUBINUR Negative 06/26/2023 1346   PROTEINUR Negative 06/26/2023 1346   NITRITE Negative 06/26/2023 1346   LEUKOCYTESUR Negative 06/26/2023 1346    Lab Results  Component Value Date   LABMICR Comment 06/26/2023   WBCUA 0-5 09/04/2022   LABEPIT 0-10 09/04/2022   BACTERIA None seen 09/04/2022    Pertinent Imaging:  No results found for this or any previous visit.  No results found for this or any previous visit.  No results found for this or any previous visit.  No results found for this or any previous visit.  Results for orders placed during the hospital encounter of 07/11/22  US  RENAL  Narrative CLINICAL DATA:  Chronic kidney disease due to type II diabetes mellitus, hypertension  EXAM: RENAL / URINARY TRACT ULTRASOUND COMPLETE  COMPARISON:  None Available.  FINDINGS: Right Kidney:  Renal measurements: 11.3 x 5.4 x 4.8 cm = volume:  153 mL. Normal cortical thickness with upper normal cortical echogenicity. No mass, hydronephrosis, or shadowing calcification.  Left Kidney:  Renal measurements: 11.6 x 6.5 x 5.6 cm = volume: 217 mL. Normal cortical thickness. Upper normal cortical echogenicity. No mass, hydronephrosis, or shadowing calcification.  Bladder:  Appears normal for degree of bladder distention. Calculated prevoid volume 658 mL with a large postvoid residual volume of 313 mL.  Other:  Enlarged prostate gland, 5.0 x 4.7 x 4.6 cm, 56 mL.  IMPRESSION: Large postvoid residual volume within urinary bladder.  Prostatic enlargement.  No renal sonographic abnormalities.   Electronically Signed By: Wynelle Heather M.D. On: 07/12/2022 18:40  No results found for this or any previous visit.  No results found for this or any previous visit.  No results found for this or any previous visit.   Assessment & Plan:    1. Benign prostatic hyperplasia with urinary obstruction (Primary) Followup 1 year with PSA  2. Incomplete emptying of bladder Continue flomax  0.4mg  daily and dutasteride  0.5mg  daily  3. Erectile dysfunction due to arterial insufficiency Continue tadalafil  10mg  prn   No follow-ups on file.  Johnie Nailer, MD  [facility] [place]

## 2024-01-02 ENCOUNTER — Encounter: Payer: Self-pay | Admitting: Urology

## 2024-01-03 NOTE — Telephone Encounter (Signed)
 FYI has been scanned in to chart

## 2024-01-08 ENCOUNTER — Other Ambulatory Visit: Payer: Self-pay

## 2024-01-08 DIAGNOSIS — R339 Retention of urine, unspecified: Secondary | ICD-10-CM

## 2024-01-08 MED ORDER — TAMSULOSIN HCL 0.4 MG PO CAPS: 0.4000 mg | ORAL_CAPSULE | Freq: Two times a day (BID) | ORAL | 11 refills | Status: AC

## 2024-02-06 ENCOUNTER — Ambulatory Visit (INDEPENDENT_AMBULATORY_CARE_PROVIDER_SITE_OTHER): Admitting: Podiatry

## 2024-02-06 DIAGNOSIS — M79609 Pain in unspecified limb: Secondary | ICD-10-CM | POA: Diagnosis not present

## 2024-02-06 DIAGNOSIS — B351 Tinea unguium: Secondary | ICD-10-CM

## 2024-02-06 NOTE — Progress Notes (Unsigned)
 Subjective: Chief Complaint  Patient presents with   [facility]    RM#11 [facility] patient has no concerns today.    79 year old male presents the office with above concerns.  States his nails are thickened elongated he cannot trim them himself and causing discomfort.  No open lesions.  No other concerns.  Last A1c was 7.7 on 10/04/2023  Tobi Fortes, MD Last seen 06/04/2023  Objective: AAO x3, NAD DP/PT pulses palpable bilaterally, CRT less than 3 seconds Nails are hypertrophic, dystrophic, brittle, discolored, elongated 10. No surrounding redness or drainage. Tenderness nails 1-5 bilaterally. No open lesions or pre-ulcerative lesions are identified today. No pain with calf compression, swelling, warmth, erythema  Assessment: Symptomatic onychomycosis  Plan: -All treatment options discussed with the patient including all alternatives, risks, complications.  -Nails sharply debrided x 10 without complications or bleeding.  -Patient encouraged to call the office with any questions, concerns, change in symptoms.   Return in about 2 months (around 04/07/2024).   Charity Conch DPM

## 2024-04-04 ENCOUNTER — Other Ambulatory Visit: Payer: Self-pay | Admitting: Internal Medicine

## 2024-04-17 ENCOUNTER — Ambulatory Visit (INDEPENDENT_AMBULATORY_CARE_PROVIDER_SITE_OTHER): Admitting: Podiatry

## 2024-04-17 VITALS — Ht 73.0 in | Wt 185.6 lb

## 2024-04-17 DIAGNOSIS — M79609 Pain in unspecified limb: Secondary | ICD-10-CM

## 2024-04-17 DIAGNOSIS — Z7901 Long term (current) use of anticoagulants: Secondary | ICD-10-CM | POA: Diagnosis not present

## 2024-04-17 DIAGNOSIS — B351 Tinea unguium: Secondary | ICD-10-CM

## 2024-04-17 NOTE — Progress Notes (Signed)
 Subjective: Chief Complaint  Patient presents with   Nail Problem    Rm 11 Patient is here for routine foot care and nail trimming. Patient is concerned about pain the left hallux.    78 year old male presents the office with above concerns.  States his nails are thickened elongated he cannot trim them himself and causing discomfort.  No open lesions.  No other concerns.  He is on Eliquis   Last A1c was 7.7 on 10/04/2023  Objective: AAO x3, NAD DP/PT pulses palpable bilaterally, CRT less than 3 seconds Nails are hypertrophic, dystrophic, brittle, discolored, elongated 10. No surrounding redness or drainage. Tenderness nails 1-5 bilaterally. No open lesions or pre-ulcerative lesions are identified today. No pain with calf compression, swelling, warmth, erythema  Assessment: Symptomatic onychomycosis  Plan: -All treatment options discussed with the patient including all alternatives, risks, complications.  -Nails sharply debrided x 10 without complications or bleeding.  -Patient encouraged to call the office with any questions, concerns, change in symptoms.   Return in about 3 months (around 07/18/2024).  Kenneth Watkins DPM

## 2024-04-24 ENCOUNTER — Other Ambulatory Visit: Payer: Self-pay | Admitting: Internal Medicine

## 2024-05-15 ENCOUNTER — Encounter: Payer: Self-pay | Admitting: Cardiology

## 2024-05-15 ENCOUNTER — Ambulatory Visit: Attending: Cardiology | Admitting: Cardiology

## 2024-05-15 VITALS — BP 136/64 | HR 60 | Ht 73.0 in | Wt 182.0 lb

## 2024-05-15 DIAGNOSIS — I10 Essential (primary) hypertension: Secondary | ICD-10-CM | POA: Diagnosis present

## 2024-05-15 DIAGNOSIS — I25119 Atherosclerotic heart disease of native coronary artery with unspecified angina pectoris: Secondary | ICD-10-CM | POA: Diagnosis present

## 2024-05-15 DIAGNOSIS — E782 Mixed hyperlipidemia: Secondary | ICD-10-CM | POA: Diagnosis present

## 2024-05-15 DIAGNOSIS — I480 Paroxysmal atrial fibrillation: Secondary | ICD-10-CM | POA: Insufficient documentation

## 2024-05-15 NOTE — Progress Notes (Signed)
 Cardiology Office Note  Date: 05/15/2024   ID: Abdo Denault., DOB 1945-11-09, MRN [id number]  History of Present Illness: Kenneth Watkins. is a 78 y.o. male last seen in February.  He is here for a routine visit.  Reports no exertional chest pain, stable NYHA class II dyspnea.  He has only gotten two AF alerts from his Apple Watch, one in January and one in February.  We discussed his medications.  He reports easy bruising and sometimes skin bleeding when he works outdoors on [employer].  He asked about a Watchman device consultation as an alternative.  Otherwise he continues on Calan  SR 120 mg daily.  I reviewed his interval lab work which is noted below.  He continues to follow through the [place] [facility].  Physical Exam: VS:  BP 136/64   Pulse 60   Ht 6' 1 (1.854 m)   Wt 182 lb (82.6 kg)   SpO2 97%   BMI 24.01 kg/m , BMI Body mass index is 24.01 kg/m.  Wt Readings from Last 3 Encounters:  05/15/24 182 lb (82.6 kg)  04/17/24 185 lb 9.6 oz (84.2 kg)  11/07/23 185 lb 9.6 oz (84.2 kg)    General: Patient appears comfortable at rest. HEENT: Conjunctiva and lids normal. Neck: Supple, no elevated JVP or carotid bruits. Lungs: Clear to auscultation, nonlabored breathing at rest. Cardiac: Regular rate and rhythm, no S3 or significant systolic murmur. Extremities: No pitting edema.  ECG:  An ECG dated 11/07/2023 was personally reviewed today and demonstrated:  Sinus rhythm with prolonged PR interval and PVC.  Labwork:  June 2024: Cholesterol 151, triglycerides 170, HDL 37, LDL 85 March 2025: BUN 32, creatinine 1.56, GFR 46, potassium 4.0, hemoglobin 16.9, platelets 199  Other Studies Reviewed Today:  No interval cardiac testing for review today.  Assessment and Plan:  1.  CAD status post DES to the OM1 and DES to the RCA in June 2021.  Symptomatically stable, no interval angina.  He is not on aspirin  given use of Eliquis.  Continue Lipitor  80 mg daily, he is also on Jardiance  25  mg daily.   2.  Paroxysmal atrial fibrillation with CHA2DS2-VASc score of 5.  He has been symptomatically stable with infrequent episodes.  At this point on Eliquis 5 mg twice daily for stroke prophylaxis and Calan  SR 120 mg daily.  He has requested evaluation for Watchman device implantation as an alternative to oral anticoagulation given concerns about easy bruising and bleeding.  [facility] Referral for Left Atrial Appendage Closure with Non-Valvular Atrial Fibrillation   Kenneth Watkins. is a 78 y.o. male is being referred to the [facility] Team for evaluation for Left Atrial Appendage Closure with Watchman device for the management of stroke risk resulting form non-valvular atrial fibrillation.    Mr. Blansett has requested consultation regarding Watchman implantation given concerns about easy bruising and bleeding on Eliquis.  The patient has a HAS-BLED score of 3 indicating a Yearly Major Bleeding Risk of 3.74%.      His CHADS2-VASc Score is 5 with an unadjusted Ischemic Stroke Rate (% per year) of 7.2%.    His stroke risk necessitates a strategy of stroke prevention with either long-term oral anticoagulation or left atrial appendage occlusion therapy. We have discussed their bleeding risk in the context of their comorbid medical problems, as well as the rationale for referral for evaluation of Watchman left atrial appendage occlusion therapy. While the patient is at high long-term bleeding  risk, they may be appropriate for short-term anticoagulation. Based on this individual patient's stroke and bleeding risk, a shared decision has been made to refer the patient for consideration of Watchman left atrial appendage closure utilizing the [employer] shared decision tool.    3.  CKD stage IIIb.  Creatinine 1.56 with GFR 46 in March.   4.  Primary hypertension.  Continue chlorthalidone  25 mg daily and Lotensin  10 mg daily.   5.  Mixed  hyperlipidemia.  Continue Lipitor  80 mg daily and keep follow-up with PCP.  Disposition:  Follow up 6 months.  Signed, Jayson JUDITHANN Sierras, M.D., F.A.C.C. [facility] at [facility]

## 2024-05-15 NOTE — Patient Instructions (Addendum)
 Medication Instructions:  Your physician recommends that you continue on your current medications as directed. Please refer to the Current Medication list given to you today.  Labwork: none  Testing/Procedures: none  Follow-Up: Your physician recommends that you schedule a follow-up appointment in: 6 months  Any Other Special Instructions Will Be Listed Below (If Applicable). You have been referred for a Watchman evaluation.  If you need a refill on your cardiac medications before your next appointment, please call your pharmacy.

## 2024-05-21 ENCOUNTER — Other Ambulatory Visit: Payer: Self-pay | Admitting: Internal Medicine

## 2024-05-27 ENCOUNTER — Encounter: Payer: Self-pay | Admitting: Cardiology

## 2024-07-05 ENCOUNTER — Other Ambulatory Visit: Payer: Self-pay

## 2024-07-05 NOTE — Progress Notes (Incomplete)
   ADVANCED HEART FAILURE NEW PATIENT CLINIC NOTE  Referring Physician: Bevely Doffing, FNP  Primary Care: Bevely Doffing, FNP Primary Cardiologist:  HPI: Kenneth Watkins. is a 78 y.o. male with a PMH of *** who presents for initial visit for further evaluation and treatment of heart failure/cardiomyopathy.      {Anything typed between these two boxes will persist and can be pulled forward to future notes. This phrase will delete itself when the note is signed :1}      SUBJECTIVE:   PMH, current medications, allergies, social history, and family history reviewed in epic.  PHYSICAL EXAM: There were no vitals filed for this visit. GENERAL: Well nourished and in no apparent distress at rest.  PULM:  Normal work of breathing, clear to auscultation bilaterally. Respirations are unlabored.  CARDIAC:  JVP: ***         Normal rate with regular rhythm. No murmurs, rubs or gallops.  *** edema. Warm and well perfused extremities. ABDOMEN: Soft, non-tender, non-distended. NEUROLOGIC: Patient is oriented x3 with no focal or lateralizing neurologic deficits.    DATA REVIEW  ECG: ***    ECHO: ***  CATH: ***    Heart failure review: - Classification: {HFCLASS:30917} - Etiology: {Cardiomyopathy:30918} - NYHA Class:  - Volume status: {volumechf:30919} - ACEi/ARB/ARNI: {HF:30752} - Aldosterone antagonist: {HF:30752} - Beta-blocker: {HF:30752} - Digoxin: {HF:30752} - Hydralazine /Nitrates: {HF:30752} - SGLT2i: {HF:30752} - GLP-1: {GLP:30906} - Advanced therapies: {Advancedtherapies:30916} - ICD: {ICD:30901}   ASSESSMENT & PLAN:  ***  Follow up in ***  Morene Brownie, MD Advanced Heart Failure Mechanical Circulatory Support 07/05/24

## 2024-07-06 ENCOUNTER — Encounter (HOSPITAL_COMMUNITY): Admitting: Cardiology

## 2024-07-06 ENCOUNTER — Encounter (HOSPITAL_COMMUNITY): Payer: Self-pay

## 2024-07-08 ENCOUNTER — Encounter: Payer: Self-pay | Admitting: Urology

## 2024-07-20 ENCOUNTER — Ambulatory Visit (INDEPENDENT_AMBULATORY_CARE_PROVIDER_SITE_OTHER): Admitting: Podiatry

## 2024-07-20 DIAGNOSIS — B351 Tinea unguium: Secondary | ICD-10-CM | POA: Diagnosis not present

## 2024-07-20 DIAGNOSIS — L84 Corns and callosities: Secondary | ICD-10-CM | POA: Diagnosis not present

## 2024-07-20 DIAGNOSIS — M79609 Pain in unspecified limb: Secondary | ICD-10-CM

## 2024-07-20 DIAGNOSIS — Z7901 Long term (current) use of anticoagulants: Secondary | ICD-10-CM

## 2024-07-20 NOTE — Progress Notes (Signed)
 Subjective: Chief Complaint  Patient presents with   Nail Problem    Pt is here to have his nails trimmed     78 year old male presents the office with above concerns.  States his nails are thickened elongated he cannot trim them himself and causing discomfort.  He recently changed shoes he has a corn formed on the left fifth toe, medial aspect.  No open lesions or any drainage that he reports.  No other concerns.  He is on Eliquis   Last A1c was 7.7 on 10/04/2023  Bevely Doffing, FNP   Objective: AAO x3, NAD DP/PT pulses palpable bilaterally, CRT less than 3 seconds Nails are hypertrophic, dystrophic, brittle, discolored, elongated 10. No surrounding redness or drainage. Tenderness nails 1-5 bilaterally.  Preulcerative lesion noted along the medial aspect of the fifth toe just adjacent to the toenail.  There is some dried blood present underneath the left side worse than the right.  There is no underlying ulceration, drainage or signs of infection. No pain with calf compression, swelling, warmth, erythema  Assessment: Symptomatic onychomycosis; preulcerative lesions  Plan: Symptomatic onychomycosis -Nails sharply debrided x 10 without complications or bleeding.   Preulcerative lesions -Sharp debrided lesions or any complications or bleeding.  Discussed she is to avoid excess pressure.  He is to monitor closely for any signs or symptoms of infection and/or skin breakdown.  Return in about 3 months (around 10/20/2024).   Donnice JONELLE Fees DPM

## 2024-08-04 ENCOUNTER — Other Ambulatory Visit: Payer: Self-pay

## 2024-08-04 ENCOUNTER — Ambulatory Visit: Attending: Cardiology | Admitting: Cardiology

## 2024-08-04 ENCOUNTER — Encounter: Payer: Self-pay | Admitting: Cardiology

## 2024-08-04 VITALS — BP 140/66 | HR 64 | Ht 73.0 in | Wt 182.0 lb

## 2024-08-04 DIAGNOSIS — I480 Paroxysmal atrial fibrillation: Secondary | ICD-10-CM | POA: Diagnosis not present

## 2024-08-04 DIAGNOSIS — D6869 Other thrombophilia: Secondary | ICD-10-CM | POA: Insufficient documentation

## 2024-08-04 NOTE — Progress Notes (Signed)
 Electrophysiology Office Note:   Date:  08/04/2024  ID:  Kenneth Watkins., DOB 12/22/1945, MRN [id number]  Primary Cardiologist: Jayson Sierras, MD Electrophysiologist: Fonda Kitty, MD      History of Present Illness:   Kenneth Watkins. is a 78 y.o. male with h/o CAD s/p PCI, paroxysmal atrial fibrillation, CKD stage IIIb, hypertension who is being seen today for evaluation for Watchman device.   Discussed the use of AI scribe software for clinical note transcription with the patient, who gave verbal consent to proceed.  History of Present Illness Kenneth Watkins. Max is a 78 year old male with atrial fibrillation who presents for discussion of the Watchman device due to bleeding concerns on blood thinners.   He has a history of atrial fibrillation, first identified in 2017 during a routine blood pressure check. Initially asymptomatic, he now experiences rare episodes, typically lasting about an hour, and is generally unaware of them unless informed by his wife or his watch. He recalls one instance of feeling lightheaded and weak after a shower but otherwise has no fatigue or dyspnea during episodes.  He is currently on Eliquis to reduce the risk of stroke due to atrial fibrillation. However, he experiences frequent bleeding episodes, such as bleeding from a minor injury with a coat hanger. He has had to apply significant pressure to stop the bleeding. He did experience a fall recently while using a leaf blower.  He has had two stents placed after an initial attempt through the wrist was unsuccessful due to anatomical challenges, necessitating a second procedure through the groin.  Socially, he is active, playing golf [place] using a push cart, and he engages in workouts. He plays at [facility] in [place], which is a private but accessible course.   Review of systems complete and found to be negative unless listed in HPI.   EP Information / Studies Reviewed:     EKG is ordered today. Personal review as below.  EKG Interpretation Date/Time:  Tuesday August 04 2024 14:02:12 EST Ventricular Rate:  64 PR Interval:  216 QRS Duration:  70 QT Interval:  378 QTC Calculation: 389 R Axis:   59  Text Interpretation: Sinus rhythm with 1st degree A-V block When compared with ECG of 07-Nov-2023 14:46, Premature ventricular complexes are no longer Present Confirmed by Kitty Fonda [phone]) on 08/04/2024 2:30:18 PM   LHC 02/23/20: Prox RCA lesion is 90% stenosed. Lat 3rd Mrg lesion is 70% stenosed. 1st Mrg lesion is 85% stenosed. Mid LAD lesion is 30% stenosed.   Multivessel coronary productive disease with 30% LAD stenosis between the first and second diagonal vessel; large codominant left circumflex coronary artery with 85% stenosis in a large proximal OM1 vessel, 20% AV groove circumflex stenosis with 70% distal stenosis in the OM 2 vessel; codominant RCA with 90% focal stenosis in the region of the RV marginal branch takeoff.  Echo 11/21/17:    Risk Assessment/Calculations:    CHA2DS2-VASc Score = 5   This indicates a 7.2% annual risk of stroke. The patient's score is based upon: CHF History: 0 HTN History: 1 Diabetes History: 1 Stroke History: 0 Vascular Disease History: 1 Age Score: 2 Gender Score: 0         Physical Exam:   VS:  BP (!) 140/66 (BP Location: Right Arm, Patient Position: Sitting, Cuff Size: Normal)   Pulse 64   Ht 6' 1 (1.854 m)   Wt 182 lb (82.6 kg)   SpO2  99%   BMI 24.01 kg/m    Wt Readings from Last 3 Encounters:  08/04/24 182 lb (82.6 kg)  05/15/24 182 lb (82.6 kg)  04/17/24 185 lb 9.6 oz (84.2 kg)     General: Well developed, in no acute distress.  Neck: No JVD.  Cardiac: Normal rate, regular rhythm.  Resp: Normal work of breathing.  Ext: No edema.  Neuro: No gross focal deficits.  Psych: Normal affect.   ASSESSMENT AND PLAN:    I have seen Kenneth Watkins. in the office today who is being  considered for a Watchman left atrial appendage closure device. I believe they will benefit from this procedure given their history of atrial fibrillation, CHA2DS2-VASc score of 5 and unadjusted ischemic stroke rate of 7.2% per year. Unfortunately, the patient is not felt to be a long term anticoagulation candidate secondary to bleeding, fall. The patient's chart has been reviewed and I feel that they would be a candidate for short term oral anticoagulation after Watchman implant.   It is my belief that after undergoing a LAA closure procedure, Kenneth Watkins. will not need long term anticoagulation which eliminates anticoagulation side effects and major bleeding risk.   Procedural risks for the Watchman implant have been reviewed with the patient including a 0.5% risk of stroke, <1% risk of perforation and <1% risk of device embolization. Other risks include bleeding, vascular damage, tamponade, worsening renal function, and death. The patient understands these risk and wishes to proceed.     The published clinical data on the safety and effectiveness of WATCHMAN include but are not limited to the following: - Holmes DR, Jess BEARD, Sick P et al. for the PROTECT AF Investigators. Percutaneous closure of the left atrial appendage versus warfarin therapy for prevention of stroke in patients with atrial fibrillation: a randomised non-inferiority trial. Lancet 2009; 374: 534-42. GLENWOOD Jess BEARD, Doshi SK, Jonita VEAR Satchel D et al. on behalf of the PROTECT AF Investigators. Percutaneous Left Atrial Appendage Closure for Stroke Prophylaxis in Patients With Atrial Fibrillation 2.3-Year Follow-up of the PROTECT AF (Watchman Left Atrial Appendage System for Embolic Protection in Patients With Atrial Fibrillation) Trial. Circulation 2013; 127:720-729. - Alli O, Doshi S,  Kar S, Reddy VY, Sievert H et al. Quality of Life Assessment in the Randomized PROTECT AF (Percutaneous Closure of the Left Atrial Appendage Versus  Warfarin Therapy for Prevention of Stroke in Patients With Atrial Fibrillation) Trial of Patients at Risk for Stroke With Nonvalvular Atrial Fibrillation. J Am Coll Cardiol 2013; 61:1790-8. GLENWOOD Satchel DR, Archer RAMAN, Price M, Whisenant B, Sievert H, Doshi S, Huber K, Reddy V. Prospective randomized evaluation of the Watchman left atrial appendage Device in patients with atrial fibrillation versus long-term warfarin therapy; the PREVAIL trial. Journal of the [employer] of Cardiology, Vol. 4, No. 1, 2014, 1-11. - Kar S, Doshi SK, Sadhu A, Horton R, Osorio J et al. Primary outcome evaluation of a next-generation left atrial appendage closure device: results from the PINNACLE FLX trial. Circulation 2021;143(18)1754-1762.   HAS-BLED score 3 Hypertension Yes  Abnormal renal and liver function (Dialysis, transplant, Cr >2.26 mg/dL /Cirrhosis or Bilirubin >2x Normal or AST/ALT/AP >3x Normal) No  Stroke No  Bleeding Yes  Labile INR (Unstable/high INR) No  Elderly (>65) Yes  Drugs or alcohol (>= 8 drinks/week, anti-plt or NSAID) No   CHA2DS2-VASc Score = 5  The patient's score is based upon: CHF History: 0 HTN History: 1 Diabetes History: 1 Stroke History: 0 Vascular Disease  History: 1 Age Score: 2 Gender Score: 0       ASSESSMENT AND PLAN: #Paroxysmal Atrial Fibrillation: Symptomatic at times associated with some lightheadedness and weakness. 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  Given that patient has paroxysmal atrial fibrillation with symptoms and would like to pursue watchman implant, I think performing concomitant ablation in efforts to reduce long-term risk of atrial fibrillation progression would be reasonable.  Risk and benefits of catheter ablation for atrial fibrillation were discussed in detail with  the patient.  Patient voiced understanding and elected to proceed. - Continue Eliquis 5 mg twice daily. -Continue verapamil  120 mg once daily.   After today's visit with the patient which was dedicated solely for shared decision making visit regarding LAA closure device and AF management, the patient decided to proceed with the LAA appendage closure procedure and concomitant catheter ablation for atrial fibrillation to be done in the near future at [facility]. Prior to the procedure, I would like to obtain a gated CT scan of the chest with contrast timed for PV/LA visualization.  Additionally, the patient will need an updated echocardiogram prior to procedure.   Follow up with Dr. Kennyth for concomitant AF ablation and watchman implantation.   Signed, Fonda Kennyth, MD

## 2024-08-04 NOTE — Patient Instructions (Addendum)
 Medication Instructions:  Your physician recommends that you continue on your current medications as directed. Please refer to the Current Medication list given to you today.  *If you need a refill on your cardiac medications before your next appointment, please call your pharmacy*  Labs: TODAY: BMET  Testing/Procedures: Cardiac CT Your physician has requested that you have cardiac CT. Cardiac computed tomography (CT) is a painless test that uses an x-ray machine to take clear, detailed pictures of your heart. For further information please visit [email or web address] Please follow instruction sheet as given. You will be called to schedule this test.  Ablation  Your physician has recommended that you have an ablation. Catheter ablation is a medical procedure used to treat some cardiac arrhythmias (irregular heartbeats). During catheter ablation, a long, thin, flexible tube is put into a blood vessel in your groin (upper thigh), or neck. This tube is called an ablation catheter. It is then guided to your heart through the blood vessel. Radio frequency waves destroy small areas of heart tissue where abnormal heartbeats may cause an arrhythmia to start.   Watchman  Your physician has requested that you have Left atrial appendage (LAA) closure device implantation is a procedure to put a small device in the LAA of the heart. The LAA is a small sac in the wall of the heart's left upper chamber. Blood clots can form in this area. The device, Watchman closes the LAA to help prevent a blood clot and stroke.   You will be contacted by Nurse Navigator, Danielle to schedule your pre-procedure visit and procedure date. If you have any questions she can be reached at [phone].   Follow-Up: At [facility], you and your health needs are our priority.  As part of our continuing mission to provide you with exceptional heart care, our providers are all part of one team.  This team includes your  primary Cardiologist (physician) and Advanced Practice Providers or APPs (Physician Assistants and Nurse Practitioners) who all work together to provide you with the care you need, when you need it.

## 2024-08-05 ENCOUNTER — Telehealth: Payer: Self-pay

## 2024-08-05 DIAGNOSIS — I480 Paroxysmal atrial fibrillation: Secondary | ICD-10-CM

## 2024-08-05 LAB — BASIC METABOLIC PANEL WITH GFR
BUN/Creatinine Ratio: 18 (ref 10–24)
BUN: 25 mg/dL (ref 8–27)
CO2: 25 mmol/L (ref 20–29)
Calcium: 9.9 mg/dL (ref 8.6–10.2)
Chloride: 97 mmol/L (ref 96–106)
Creatinine, Ser: 1.4 mg/dL — ABNORMAL HIGH (ref 0.76–1.27)
Glucose: 106 mg/dL — ABNORMAL HIGH (ref 70–99)
Potassium: 3.9 mmol/L (ref 3.5–5.2)
Sodium: 139 mmol/L (ref 134–144)
eGFR: 52 mL/min/1.73 — ABNORMAL LOW (ref >59)

## 2024-08-05 NOTE — Telephone Encounter (Signed)
 Spoke with patient to arrange testing prior to tentative concomitant ablation/watchman. Tentative date of 09/28/2024 (patient did not want to go sooner).   Scheduled CT and echo same day. Labs to be done prior to CT.  Will send instructions via MyChart.

## 2024-08-06 ENCOUNTER — Other Ambulatory Visit: Payer: Self-pay | Admitting: Internal Medicine

## 2024-08-09 ENCOUNTER — Ambulatory Visit: Payer: Self-pay | Admitting: Cardiology

## 2024-08-09 DIAGNOSIS — R911 Solitary pulmonary nodule: Secondary | ICD-10-CM

## 2024-08-11 ENCOUNTER — Ambulatory Visit

## 2024-08-11 VITALS — BP 120/64 | HR 75 | Resp 16 | Ht 73.0 in | Wt 182.0 lb

## 2024-08-11 DIAGNOSIS — B356 Tinea cruris: Secondary | ICD-10-CM

## 2024-08-11 DIAGNOSIS — I10 Essential (primary) hypertension: Secondary | ICD-10-CM

## 2024-08-11 DIAGNOSIS — L989 Disorder of the skin and subcutaneous tissue, unspecified: Secondary | ICD-10-CM

## 2024-08-11 DIAGNOSIS — E782 Mixed hyperlipidemia: Secondary | ICD-10-CM

## 2024-08-11 DIAGNOSIS — E291 Testicular hypofunction: Secondary | ICD-10-CM

## 2024-08-11 DIAGNOSIS — N401 Enlarged prostate with lower urinary tract symptoms: Secondary | ICD-10-CM

## 2024-08-11 DIAGNOSIS — I480 Paroxysmal atrial fibrillation: Secondary | ICD-10-CM | POA: Diagnosis not present

## 2024-08-11 MED ORDER — BENAZEPRIL HCL 20 MG PO TABS: 20.0000 mg | ORAL_TABLET | Freq: Every day | ORAL | 3 refills | Status: AC

## 2024-08-11 MED ORDER — ATORVASTATIN CALCIUM 80 MG PO TABS: 80.0000 mg | ORAL_TABLET | Freq: Every day | ORAL | 3 refills | Status: AC

## 2024-08-11 MED ORDER — TESTOSTERONE CYPIONATE 200 MG/ML IM SOLN: 200.0000 mg | INTRAMUSCULAR | 2 refills | Status: AC

## 2024-08-11 MED ORDER — CHLORTHALIDONE 25 MG PO TABS: 25.0000 mg | ORAL_TABLET | Freq: Every day | ORAL | 3 refills | Status: AC

## 2024-08-11 NOTE — Progress Notes (Signed)
 Established Patient Office Visit  Subjective   Patient ID: Kenneth Rindfleisch., male    DOB: 22-Mar-1946  Age: 78 y.o. MRN: [id number]  Chief Complaint  Patient presents with   Medical Management of Chronic Issues    6 month follow up     HPI Discussed the use of AI scribe software for clinical note transcription with the patient, who gave verbal consent to proceed.  History of Present Illness    Kenneth Tagliaferro. Max is a 78 year old male who presents for a follow-up visit regarding kidney and bladder issues.  Renal dysfunction - Ongoing kidney issues managed by a nephrologist - Prescribed potassium supplementation - Currently taking benazepril  and chlorthalidone   Lower urinary tract symptoms and bladder dysfunction - Bladder retention and bladder hernia under urologist care - Scheduled for a urodynamic procedure - Flomax  taken in the morning and evening to aid urine flow - Flomax  associated with sexual dysfunction  Anticoagulation-related bleeding tendency - Eliquis use resulting in easy bruising and bleeding  Prostatic disease - History of prostate issues - PSA level stable at 1.1  Dermatologic lesion - Rash on the buttocks biopsied by a dermatologist - Resulting in a hard, black spot at the biopsy site - Seeking new referral for dermatological care  Physical activity - Plays golf with a push cart three to four times per week     Patient Active Problem List   Diagnosis Date Noted   Loose bowel movements 10/07/2023   Male erectile dysfunction, unspecified 06/07/2023   Encounter for fitting and adjustment of hearing aid 06/07/2023   Sensorineural hearing loss, bilateral 06/07/2023   Need for influenza vaccination 06/04/2023   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   BPH (benign prostatic hyperplasia) 03/10/2023   Tinea cruris 03/10/2023   Sacroiliac joint pain 08/29/2022   Chronic low back pain 08/17/2022   Coronary stent patent 07/03/2022   Presbycusis of both ears  06/25/2022   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   Right inguinal hernia 01/02/2022   Diastasis recti 01/02/2022   Secondary hypercoagulable state 05/23/2021   Hyperlipidemia 02/25/2020   Paroxysmal atrial fibrillation (HCC) 02/25/2020   Hypertension 02/25/2020   CKD (chronic kidney disease) stage 2, GFR 60-89 ml/min 02/25/2020   Type 2 diabetes mellitus with complication, without long-term current use of insulin (HCC) 02/25/2020   CAD (coronary artery disease), native coronary artery;DES to OM1 LCX, DES to mid RCA 02/24/20 02/23/2020   Exertional dyspnea    Abnormal nuclear stress test    Impingement syndrome of left shoulder 05/31/2016   Bilateral hearing loss 12/29/2014      ROS    Objective:     BP 120/64   Pulse 75   Resp 16   Ht 6' 1 (1.854 m)   Wt 182 lb (82.6 kg)   SpO2 96%   BMI 24.01 kg/m  BP Readings from Last 3 Encounters:  08/11/24 120/64  08/04/24 (!) 140/66  05/15/24 136/64   Wt Readings from Last 3 Encounters:  08/11/24 182 lb (82.6 kg)  08/04/24 182 lb (82.6 kg)  05/15/24 182 lb (82.6 kg)     Physical Exam Vitals and nursing note reviewed.  Constitutional:      Appearance: Normal appearance.  HENT:     Head: Normocephalic.  Eyes:     Extraocular Movements: Extraocular movements intact.     Pupils: Pupils are equal, round, and reactive to light.  Cardiovascular:     Rate and Rhythm: Normal rate and regular rhythm.  Pulmonary:     Effort: Pulmonary effort is normal.     Breath sounds: Normal breath sounds.  Musculoskeletal:     Cervical back: Normal range of motion and neck supple.  Neurological:     Mental Status: He is alert and oriented to person, place, and time.  Psychiatric:        Mood and Affect: Mood normal.        Thought Content: Thought content normal.     No results found for any visits on 08/11/24.  Last CBC Lab Results  Component Value Date   WBC 5.3 03/06/2023   HGB 17.2 03/06/2023   HCT 50.9 03/06/2023    MCV 90 03/06/2023   MCH 30.6 03/06/2023   RDW 13.3 03/06/2023   PLT 184 03/06/2023   Last metabolic panel Lab Results  Component Value Date   GLUCOSE 106 (H) 08/04/2024   NA 139 08/04/2024   K 3.9 08/04/2024   CL 97 08/04/2024   CO2 25 08/04/2024   BUN 25 08/04/2024   CREATININE 1.40 (H) 08/04/2024   EGFR 52 (L) 08/04/2024   CALCIUM  9.9 08/04/2024   PROT 6.7 03/06/2023   ALBUMIN 4.4 03/06/2023   LABGLOB 2.3 03/06/2023   BILITOT 0.9 03/06/2023   ALKPHOS 80 03/06/2023   AST 25 03/06/2023   ALT 32 03/06/2023   ANIONGAP 7 01/26/2022   Last lipids Lab Results  Component Value Date   CHOL 151 03/06/2023   HDL 37 (L) 03/06/2023   LDLCALC 85 03/06/2023   TRIG 170 (H) 03/06/2023   CHOLHDL 4.1 03/06/2023   Last hemoglobin A1c Lab Results  Component Value Date   HGBA1C 7.7 (H) 10/04/2023   Last thyroid functions Lab Results  Component Value Date   TSH 4.980 (H) 03/06/2023   FREET4 1.13 03/06/2023   Last vitamin B12 and Folate Lab Results  Component Value Date   VITAMINB12 946 03/06/2023   FOLATE >20.0 03/06/2023      The 10-year ASCVD risk score (Arnett DK, et al., 2019) is: 45.2%    Assessment & Plan:   Problem List Items Addressed This Visit       Cardiovascular and Mediastinum   Paroxysmal atrial fibrillation (HCC)   Ablation and Watchman procedure planned to reduce stroke risk and potentially discontinue Eliquis. - Proceed with scheduled ablation and Watchman procedure on January 12th.      Relevant Medications   atorvastatin  (LIPITOR ) 80 MG tablet   benazepril  (LOTENSIN ) 20 MG tablet   chlorthalidone  (HYGROTON ) 25 MG tablet   Hypertension   - Continue current antihypertensive medications: atorvastatin , benazepril , and chlorthalidone .      Relevant Medications   atorvastatin  (LIPITOR ) 80 MG tablet   benazepril  (LOTENSIN ) 20 MG tablet   chlorthalidone  (HYGROTON ) 25 MG tablet     Endocrine   Hypogonadism in male - Primary   History of  hypogonadism.  Currently on testosterone  replacement therapy. -Repeat testosterone  levels ordered today, to be completed one morning later this week.      Relevant Medications   testosterone  cypionate (DEPOTESTOSTERONE CYPIONATE) 200 MG/ML injection     Musculoskeletal and Integument   Tinea cruris   Uses clotrimazole/betamethasone cream as needed.        Genitourinary   BPH (benign prostatic hyperplasia)   PSA level is 1.1, indicating well-managed condition. - Continue Flomax  for urinary flow improvement.        Other   Hyperlipidemia   Managed with atorvastatin . - Continue atorvastatin  for lipid management.  Relevant Medications   atorvastatin  (LIPITOR ) 80 MG tablet   benazepril  (LOTENSIN ) 20 MG tablet   chlorthalidone  (HYGROTON ) 25 MG tablet   Other Visit Diagnoses       Skin lesion       Relevant Orders   Ambulatory referral to Dermatology       No follow-ups on file.    Leita Longs, FNP

## 2024-08-16 NOTE — Assessment & Plan Note (Signed)
 Managed with atorvastatin . - Continue atorvastatin  for lipid management.

## 2024-08-16 NOTE — Assessment & Plan Note (Signed)
-   Continue current antihypertensive medications: atorvastatin , benazepril , and chlorthalidone .

## 2024-08-16 NOTE — Assessment & Plan Note (Signed)
Uses clotrimazole/betamethasone cream as needed.

## 2024-08-16 NOTE — Assessment & Plan Note (Signed)
 Ablation and Watchman procedure planned to reduce stroke risk and potentially discontinue Eliquis. - Proceed with scheduled ablation and Watchman procedure on January 12th.

## 2024-08-16 NOTE — Assessment & Plan Note (Signed)
 PSA level is 1.1, indicating well-managed condition. - Continue Flomax  for urinary flow improvement.

## 2024-08-16 NOTE — Assessment & Plan Note (Signed)
History of hypogonadism.  Currently on testosterone replacement therapy. -Repeat testosterone levels ordered today, to be completed one morning later this week.

## 2024-09-02 LAB — BASIC METABOLIC PANEL WITH GFR
BUN/Creatinine Ratio: 14 (ref 10–24)
BUN: 20 mg/dL (ref 8–27)
CO2: 27 mmol/L (ref 20–29)
Calcium: 9.3 mg/dL (ref 8.6–10.2)
Chloride: 97 mmol/L (ref 96–106)
Creatinine, Ser: 1.4 mg/dL — ABNORMAL HIGH (ref 0.76–1.27)
Glucose: 139 mg/dL — ABNORMAL HIGH (ref 70–99)
Potassium: 3.4 mmol/L — ABNORMAL LOW (ref 3.5–5.2)
Sodium: 136 mmol/L (ref 134–144)
eGFR: 52 mL/min/1.73 — ABNORMAL LOW (ref >59)

## 2024-09-02 LAB — CBC WITH DIFFERENTIAL/PLATELET
Basophils Absolute: 0.1 x10E3/uL (ref 0.0–0.2)
Basos: 1 %
EOS (ABSOLUTE): 0.4 x10E3/uL (ref 0.0–0.4)
Eos: 6 %
Hematocrit: 49.3 % (ref 37.5–51.0)
Hemoglobin: 16.4 g/dL (ref 13.0–17.7)
Immature Grans (Abs): 0 x10E3/uL (ref 0.0–0.1)
Immature Granulocytes: 0 %
Lymphocytes Absolute: 1.8 x10E3/uL (ref 0.7–3.1)
Lymphs: 30 %
MCH: 30.8 pg (ref 26.6–33.0)
MCHC: 33.3 g/dL (ref 31.5–35.7)
MCV: 93 fL (ref 79–97)
Monocytes Absolute: 0.6 x10E3/uL (ref 0.1–0.9)
Monocytes: 9 %
Neutrophils Absolute: 3.3 x10E3/uL (ref 1.4–7.0)
Neutrophils: 54 %
Platelets: 188 x10E3/uL (ref 150–450)
RBC: 5.33 x10E6/uL (ref 4.14–5.80)
RDW: 12.9 % (ref 11.6–15.4)
WBC: 6.1 x10E3/uL (ref 3.4–10.8)

## 2024-09-05 ENCOUNTER — Ambulatory Visit: Payer: Self-pay | Admitting: Cardiology

## 2024-09-15 ENCOUNTER — Ambulatory Visit

## 2024-09-15 VITALS — BP 112/60 | HR 58 | Resp 12 | Ht 73.0 in | Wt 177.1 lb

## 2024-09-15 DIAGNOSIS — N182 Chronic kidney disease, stage 2 (mild): Secondary | ICD-10-CM | POA: Diagnosis not present

## 2024-09-15 DIAGNOSIS — Z0000 Encounter for general adult medical examination without abnormal findings: Secondary | ICD-10-CM

## 2024-09-15 DIAGNOSIS — E118 Type 2 diabetes mellitus with unspecified complications: Secondary | ICD-10-CM | POA: Diagnosis not present

## 2024-09-15 DIAGNOSIS — Z0001 Encounter for general adult medical examination with abnormal findings: Secondary | ICD-10-CM

## 2024-09-15 NOTE — Patient Instructions (Signed)
 Kenneth Watkins,  Thank you for taking the time for your Medicare Wellness Visit. I appreciate your continued commitment to your health goals. Please review the care plan we discussed, and feel free to reach out if I can assist you further.  Please note that Annual Wellness Visits do not include a physical exam. Some assessments may be limited, especially if the visit was conducted virtually. If needed, we may recommend an in-person follow-up with your provider.  Ongoing Care Seeing your primary care provider every 3 to 6 months helps us  monitor your health and provide consistent, personalized care.   1 year follow up for Medicare well visit: September 20, 2025 at 1:50 pm with medicare wellness nurse in office  Recommended Screenings:  Health Maintenance  Topic Date Due   COVID-19 Vaccine (3 - Moderna risk series) 01/22/2020   Yearly kidney health urinalysis for diabetes  Never done   Hemoglobin A1C  04/02/2024   Complete foot exam   06/06/2024   Medicare Annual Wellness Visit  06/24/2024   Eye exam for diabetics  06/30/2024   Yearly kidney function blood test for diabetes  09/02/2025   DTaP/Tdap/Td vaccine (4 - Td or Tdap) 12/03/2032   Pneumococcal Vaccine for age over 6  Completed   Flu Shot  Completed   Zoster (Shingles) Vaccine  Completed   Meningitis B Vaccine  Aged Out   Colon Cancer Screening  Discontinued   Hepatitis C Screening  Discontinued       09/11/2024    4:37 PM  Advanced Directives  Does Patient Have a Medical Advance Directive? Yes  Type of Estate Agent of [place];Living will  Copy of Healthcare Power of Attorney in Chart? No - copy requested    Vision: Annual vision screenings are recommended for early detection of glaucoma, cataracts, and diabetic retinopathy. These exams can also reveal signs of chronic conditions such as diabetes and high blood pressure.  Dental: Annual dental screenings help detect early signs of oral cancer, gum  disease, and other conditions linked to overall health, including heart disease and diabetes.  Please see the attached documents for additional preventive care recommendations.

## 2024-09-15 NOTE — Progress Notes (Signed)
 "  No voiced or noted concerns at this time. Chief Complaint  Patient presents with   Medicare Wellness     Subjective:   Kenneth Daleo. is a 78 y.o. male who presents for a Medicare Annual Wellness Visit.  Visit info / Clinical Intake: Medicare Wellness Visit Type:: Subsequent Annual Wellness Visit Persons participating in visit and providing information:: patient Medicare Wellness Visit Mode:: In-person (required for WTM) Interpreter Needed?: No Pre-visit prep was completed: yes AWV questionnaire completed by patient prior to visit?: yes Date:: 09/11/24 Living arrangements:: (Patient-Rptd) lives with spouse/significant other Patient's Overall Health Status Rating: (Patient-Rptd) very good Typical amount of pain: (Patient-Rptd) some Does pain affect daily life?: (Patient-Rptd) no Are you currently prescribed opioids?: no  Dietary Habits and Nutritional Risks How many meals a day?: (Patient-Rptd) 3 Eats fruit and vegetables daily?: (Patient-Rptd) yes Most meals are obtained by: (Patient-Rptd) preparing own meals In the last 2 weeks, have you had any of the following?: none Diabetic:: (!) yes Any non-healing wounds?: no How often do you check your BS?: 1 Would you like to be referred to a Nutritionist or for Diabetic Management? : no  Functional Status Activities of Daily Living (to include ambulation/medication): (Patient-Rptd) Independent Ambulation: (Patient-Rptd) Independent Medication Administration: (Patient-Rptd) Independent Home Management (perform basic housework or laundry): (Patient-Rptd) Independent Manage your own finances?: (Patient-Rptd) yes Primary transportation is: (Patient-Rptd) driving Concerns about vision?: no *vision screening is required for WTM* Concerns about hearing?: no  Fall Screening Falls in the past year?: (Patient-Rptd) 0 Number of falls in past year: 0 Was there an injury with Fall?: 0 Fall Risk Category Calculator: 0 Patient Fall  Risk Level: Low Fall Risk  Fall Risk Patient at Risk for Falls Due to: No Fall Risks Fall risk Follow up: Falls evaluation completed; Education provided; Falls prevention discussed  Home and Transportation Safety: All rugs have non-skid backing?: (!) (Patient-Rptd) no All stairs or steps have railings?: (Patient-Rptd) yes Grab bars in the bathtub or shower?: (!) (Patient-Rptd) no Have non-skid surface in bathtub or shower?: (Patient-Rptd) yes Good home lighting?: (Patient-Rptd) yes Regular seat belt use?: (Patient-Rptd) yes Hospital stays in the last year:: (Patient-Rptd) no  Cognitive Assessment Difficulty concentrating, remembering, or making decisions? : (Patient-Rptd) no Will 6CIT or Mini Cog be Completed: yes What year is it?: 0 points What month is it?: 0 points Give patient an address phrase to remember (5 components): [address] [place] About what time is it?: 0 points Count backwards from 20 to 1: 0 points Say the months of the year in reverse: 0 points Repeat the address phrase from earlier: 0 points 6 CIT Score: 0 points  Advance Directives (For Healthcare) Does Patient Have a Medical Advance Directive?: Yes Type of Advance Directive: Healthcare Power of [place]; Living will Copy of Healthcare Power of Attorney in Chart?: No - copy requested Copy of Living Will in Chart?: No - copy requested  Reviewed/Updated  Reviewed/Updated: Reviewed All (Medical, Surgical, Family, Medications, Allergies, Care Teams, Patient Goals)    Allergies (verified) Codeine and Farxiga [dapagliflozin]   Current Medications (verified) Outpatient Encounter Medications as of 09/15/2024  Medication Sig   Accu-Chek Softclix Lancets lancets 1 each by Other route as needed.   acetaminophen  (TYLENOL ) 650 MG CR tablet Take 650 mg by mouth every 8 (eight) hours as needed for pain.   apixaban (ELIQUIS) 5 MG TABS tablet Take 5 mg by mouth 2 (two) times daily.   atorvastatin  (LIPITOR ) 80  MG tablet Take 1  tablet (80 mg total) by mouth daily.   B-D 3CC LUER-LOK SYR 22GX1 22G X 1 3 ML MISC 1 each by Other route as needed.   BD DISP NEEDLES 18G X 1-1/2 MISC 1 each by Does not apply route See admin instructions. Use daily   benazepril  (LOTENSIN ) 20 MG tablet Take 1 tablet (20 mg total) by mouth daily.   chlorthalidone  (HYGROTON ) 25 MG tablet Take 1 tablet (25 mg total) by mouth daily.   cholecalciferol (VITAMIN D3) 25 MCG (1000 UNIT) tablet Take 1,000 Units by mouth daily.   clotrimazole-betamethasone (LOTRISONE) cream Apply 1 application  topically daily as needed (Rash). Do not apply to buttocks   Coenzyme Q10 (CO Q 10 PO) Take 1 capsule by mouth daily.   CONTOUR NEXT TEST test strip 1 each by Other route daily.   dutasteride  (AVODART ) 0.5 MG capsule Take 1 capsule (0.5 mg total) by mouth daily.   empagliflozin  (JARDIANCE ) 25 MG TABS tablet Take 1 tablet (25 mg total) by mouth daily.   famotidine (PEPCID) 10 MG tablet Take 10 mg by mouth daily as needed for heartburn or indigestion.   fluticasone (FLONASE) 50 MCG/ACT nasal spray Place 2 sprays into both nostrils daily. (Patient taking differently: Place 2 sprays into both nostrils as needed for allergies or rhinitis.)   ketoconazole  (NIZORAL ) 2 % cream Apply to skin on bottoms of feet twice daily until peeling resolves (about 3 weeks)   KLOR-CON M10 10 MEQ tablet Take 10 mEq by mouth daily.   Misc Natural Products (MENS PROSTATE HEALTH FORMULA PO) Take 1 tablet by mouth in the morning and at bedtime. Prostate Formula: [employer]   Misc Natural Products (OSTEO BI-FLEX TRIPLE STRENGTH PO) Take 1 tablet by mouth in the morning and at bedtime.   Multiple Vitamins-Minerals (MULTIVITAMIN WITH MINERALS) tablet Take 1 tablet by mouth daily. Men's   tadalafil  (CIALIS ) 10 MG tablet Take 1 tablet (10 mg total) by mouth as needed for erectile dysfunction.   tamsulosin  (FLOMAX ) 0.4 MG CAPS capsule Take 1 capsule (0.4 mg total) by mouth in the  morning and at bedtime.   testosterone  cypionate (DEPOTESTOSTERONE CYPIONATE) 200 MG/ML injection Inject 1 mL (200 mg total) into the muscle every 21 ( twenty-one) days.   verapamil  (CALAN -SR) 120 MG CR tablet TAKE 1 TABLET BY MOUTH AT BEDTIME.   No facility-administered encounter medications on file as of 09/15/2024.    History: Past Medical History:  Diagnosis Date   Allergy 2012   Tree pollen   Arthritis 2019   some in the fingers   BPH (benign prostatic hyperplasia)    CAD (coronary artery disease)    a. s/p DES to OM1 and DES to RCA in 02/2020   Cataract 2014   cataracts surgery for both   CKD (chronic kidney disease) stage 3, GFR 30-59 ml/min (HCC)    Clotting disorder 2017   take blood thinner   GERD (gastroesophageal reflux disease)    Hyperlipidemia    Hypertension    Persistent atrial fibrillation (HCC)    Pinched nerve in neck    Type 2 diabetes mellitus (HCC)    Past Surgical History:  Procedure Laterality Date   CARDIAC CATHETERIZATION     COLONOSCOPY WITH PROPOFOL  N/A 04/21/2021   Procedure: COLONOSCOPY WITH PROPOFOL ;  Surgeon: Eartha Angelia Sieving, MD;  Location: [facility];  Service: Gastroenterology;  Laterality: N/A;  7:30   CORONARY STENT INTERVENTION N/A 02/24/2020   Procedure: CORONARY STENT INTERVENTION;  Surgeon: Burnard,  Debby LABOR, MD;  Location: [facility];  Service: Cardiovascular;  Laterality: N/A;   EYE SURGERY     Torn retina repair righteye   HERNIA REPAIR  2023   INGUINAL HERNIA REPAIR Right 01/29/2022   Procedure: HERNIA REPAIR INGUINAL ADULT WITH MESH;  Surgeon: Kallie Manuelita BROCKS, MD;  Location: [facility];  Service: General;  Laterality: Right;   LEFT HEART CATH AND CORONARY ANGIOGRAPHY N/A 02/23/2020   Procedure: LEFT HEART CATH AND CORONARY ANGIOGRAPHY;  Surgeon: Burnard Debby LABOR, MD;  Location: [facility];  Service: Cardiovascular;  Laterality: N/A;   TONSILLECTOMY     VASECTOMY     Family History  Problem Relation  Age of Onset   Uterine cancer Mother    Diabetes Mother    Stroke Mother    Hearing loss Mother    Heart disease Mother    Hypertension Mother    Cancer Mother    Heart attack Father    Stroke Father    Heart disease Father    Social History   Occupational History   Occupation: surveyor, minerals  Tobacco Use   Smoking status: Never   Smokeless tobacco: Never  Vaping Use   Vaping status: Never Used  Substance and Sexual Activity   Alcohol use: No   Drug use: Never   Sexual activity: Yes    Birth control/protection: Surgical    Comment: Less sexual desire sexual performance since taking Flomax  twice a day.   Tobacco Counseling Counseling given: Yes  SDOH Screenings   Food Insecurity: No Food Insecurity (09/11/2024)  Housing: Low Risk (09/11/2024)  Transportation Needs: No Transportation Needs (09/11/2024)  Utilities: Not At Risk (09/15/2024)  Alcohol Screen: Low Risk (06/23/2023)  Depression (PHQ2-9): Low Risk (09/15/2024)  Financial Resource Strain: Low Risk (09/11/2024)  Physical Activity: Sufficiently Active (09/11/2024)  Social Connections: Socially Integrated (09/11/2024)  Stress: No Stress Concern Present (09/11/2024)  Tobacco Use: Low Risk (09/15/2024)  Health Literacy: Adequate Health Literacy (09/15/2024)   See flowsheets for full screening details  Depression Screen PHQ 2 & 9 Depression Scale- Over the past 2 weeks, how often have you been bothered by any of the following problems? Little interest or pleasure in doing things: 0 Feeling down, depressed, or hopeless (PHQ Adolescent also includes...irritable): 0 PHQ-2 Total Score: 0 Trouble falling or staying asleep, or sleeping too much: 0 Feeling tired or having little energy: 0 Poor appetite or overeating (PHQ Adolescent also includes...weight loss): 0 Feeling bad about yourself - or that you are a failure or have let yourself or your family down: 0 Trouble concentrating on things, such as reading  the newspaper or watching television (PHQ Adolescent also includes...like school work): 0 Moving or speaking so slowly that other people could have noticed. Or the opposite - being so fidgety or restless that you have been moving around a lot more than usual: 0 Thoughts that you would be better off dead, or of hurting yourself in some way: 0 PHQ-9 Total Score: 0  Depression Treatment Depression Interventions/Treatment : EYV7-0 Score <4 Follow-up Not Indicated     Goals Addressed             This Visit's Progress    Patient Stated   On track    Increase exercise             Objective:    Today's Vitals   09/15/24 1724  BP: 112/60  Pulse: (!) 58  Resp: 12  SpO2: 98%  Weight: 177 lb 1.9 oz (80.3 kg)  Height: 6' 1 (1.854 m)   Body mass index is 23.37 kg/m.  Hearing/Vision screen Hearing Screening - Comments:: Patient doesn't c/o hearing difficulty today   Vision Screening - Comments:: Patient is up to date with yearly eye exams with Elsie Hummer at [facility]. Last eye exam notes requested Immunizations and Health Maintenance Health Maintenance  Topic Date Due   COVID-19 Vaccine (3 - Moderna risk series) 01/22/2020   Diabetic kidney evaluation - Urine ACR  Never done   HEMOGLOBIN A1C  04/02/2024   FOOT EXAM  06/06/2024   Medicare Annual Wellness (AWV)  06/24/2024   OPHTHALMOLOGY EXAM  06/30/2024   Diabetic kidney evaluation - eGFR measurement  09/02/2025   DTaP/Tdap/Td (4 - Td or Tdap) 12/03/2032   Pneumococcal Vaccine: 50+ Years  Completed   Influenza Vaccine  Completed   Zoster Vaccines- Shingrix  Completed   Meningococcal B Vaccine  Aged Out   Colonoscopy  Discontinued   Hepatitis C Screening  Discontinued        Assessment/Plan:  This is a routine wellness examination for Kenneth Watkins.  Patient Care Team: Bevely Doffing, FNP as PCP - General (Family Medicine) Elsie CHRISTELLA Hummer as Consulting Physician (Ophthalmology) Debera Jayson MATSU, MD as Consulting Physician (Cardiology) Kennyth Chew, MD as Consulting Physician (Cardiology) McKenzie, Belvie CROME, MD as Consulting Physician (Urology) Rachele Gaynell RAMAN, MD as Referring Physician (Nephrology) Clinic, [facility] Medicine)  I have personally reviewed and noted the following in the patients chart:   Medical and social history Use of alcohol, tobacco or illicit drugs  Current medications and supplements including opioid prescriptions. Functional ability and status Nutritional status Physical activity Advanced directives List of other physicians Hospitalizations, surgeries, and ER visits in previous 12 months Vitals Screenings to include cognitive, depression, and falls Referrals and appointments  Orders Placed This Encounter  Procedures   Microalbumin / creatinine urine ratio   Hemoglobin A1c   In addition, I have reviewed and discussed with patient certain preventive protocols, quality metrics, and best practice recommendations. A written personalized care plan for preventive services as well as general preventive health recommendations were provided to patient.   Ornella Coderre, CMA   09/15/2024   Return for Medicare Wellness Visit September 20, 2025 at 1:50 pm.  After Visit Summary: (MyChart) Due to this being a telephonic visit, the after visit summary with patients personalized plan was offered to patient via MyChart    "

## 2024-09-17 LAB — MICROALBUMIN / CREATININE URINE RATIO
Creatinine, Urine: 32.3 mg/dL
Microalb/Creat Ratio: <9 mg/g{creat} (ref 0–29)
Microalbumin, Urine: <3 ug/mL

## 2024-09-17 LAB — HEMOGLOBIN A1C
Est. average glucose Bld gHb Est-mCnc: 177 mg/dL
Hgb A1c MFr Bld: 7.8 % — ABNORMAL HIGH (ref 4.8–5.6)

## 2024-09-18 ENCOUNTER — Ambulatory Visit (HOSPITAL_COMMUNITY)
Admission: RE | Admit: 2024-09-18 | Discharge: 2024-09-18 | Disposition: A | Discharge status: Home / Self Care | Source: Ambulatory Visit | Attending: Internal Medicine | Admitting: Internal Medicine

## 2024-09-18 DIAGNOSIS — I480 Paroxysmal atrial fibrillation: Secondary | ICD-10-CM | POA: Diagnosis present

## 2024-09-18 LAB — ECHOCARDIOGRAM COMPLETE
Area-P 1/2: 3.01 cm2
S' Lateral: 2.3 cm

## 2024-09-18 MED ORDER — IOHEXOL 350 MG/ML SOLN
80.0000 mL | Freq: Once | INTRAVENOUS | Status: AC | PRN
  Administered 2024-09-18: 80 mL via INTRAVENOUS

## 2024-09-21 ENCOUNTER — Telehealth: Payer: Self-pay

## 2024-09-21 DIAGNOSIS — I480 Paroxysmal atrial fibrillation: Secondary | ICD-10-CM

## 2024-09-21 NOTE — Telephone Encounter (Signed)
 Trabeculated mushroom-like appendage Max 24/ AVG 19.5/ Depth 12.2 Likely use a 24mm device Mid/Mid TSP and TruSteer sheath RAO 22 CAU 20

## 2024-09-24 ENCOUNTER — Ambulatory Visit: Admitting: Podiatry

## 2024-09-24 DIAGNOSIS — M79675 Pain in left toe(s): Secondary | ICD-10-CM

## 2024-09-24 DIAGNOSIS — Z7901 Long term (current) use of anticoagulants: Secondary | ICD-10-CM | POA: Diagnosis not present

## 2024-09-24 DIAGNOSIS — M79674 Pain in right toe(s): Secondary | ICD-10-CM

## 2024-09-24 DIAGNOSIS — B351 Tinea unguium: Secondary | ICD-10-CM | POA: Diagnosis not present

## 2024-09-24 NOTE — Telephone Encounter (Signed)
 Patient reached out via MyChart to inquire about scheduling. Advised waiting for Dr. Kennyth to review.

## 2024-09-24 NOTE — Addendum Note (Signed)
 Addended by: Shannon Balthazar on: 09/24/2024 04:24 PM   Modules accepted: Orders

## 2024-09-24 NOTE — Telephone Encounter (Signed)
 Spoke with patient. Advised Dr. Kennyth would prefer patient to undergo afib ablation first then reconsider watchman at a later time. Patient is agreeable to that. Scheduled patient for afib ablation 11/05/2024 at 0730 with 0530 report time. Will send to April to send letter and Monalisa, RN Navigator to follow patient.   Patient grateful for the call.

## 2024-09-26 NOTE — Progress Notes (Signed)
 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  States his nails are thickened elongated he cannot trim them himself and causing discomfort.   No other concerns.  He is on Eliquis   Last A1c was 7.8 on 09/15/2024  Bevely Doffing, FNP Last seen 08/11/2024   Objective: AAO x3, NAD DP/PT pulses palpable bilaterally, CRT less than 3 seconds Nails are hypertrophic, dystrophic, brittle, discolored, elongated 10. No surrounding redness or drainage. Tenderness nails 1-5 bilaterally.  No open lesoins No pain with calf compression, swelling, warmth, erythema  Assessment: Symptomatic onychomycosis; preulcerative lesions  Plan: Symptomatic onychomycosis -Nails sharply debrided x 10 without complications or bleeding.   Return in about 2 months (around 11/25/2024) for nali trim .  Donnice JONELLE Fees DPM

## 2024-09-28 ENCOUNTER — Encounter: Payer: Self-pay | Admitting: Cardiology

## 2024-09-28 NOTE — Telephone Encounter (Signed)
 Referral placed for pulmonary nodule clinic.

## 2024-10-07 ENCOUNTER — Encounter (HOSPITAL_COMMUNITY): Payer: Self-pay

## 2024-10-07 ENCOUNTER — Telehealth (HOSPITAL_COMMUNITY): Payer: Self-pay

## 2024-10-07 NOTE — Telephone Encounter (Signed)
 Spoke with patient to complete pre-procedure call.     Health status review:  Any new medical conditions, recent signs of acute illness or been started on antibiotics? No Any recent hospitalizations or surgeries? No Any new medications started since pre-op visit? No  Follow all medication instructions prior to procedure or the procedure may be rescheduled:    Continue taking Eliquis (Apixaban) twice daily without missing any doses before procedure. Essential chronic medications:  No medication should be continued, unless told otherwise.  HOLD: Empagliflozin  (Jardiance ) for 3 days prior to the procedure. Last dose on Sunday, February 15.   On the morning of your procedure DO NOT take any medication., including Eliquis (Apixaban).  Nothing to eat or drink after midnight prior to your procedure.  Pre-procedure testing scheduled: CT completed and lab work by  February 5.  Confirmed patient is scheduled for Atrial Fibrillation Ablation on Thursday, February 19 with Dr. Kennyth. Instructed patient to arrive at the Main Entrance A at [facility]: [address] [place], [place] [address] and check in at Admitting at 5:30 AM.  Plan to go home the same day, you will only stay overnight if medically necessary. You MUST have a responsible adult to drive you home and MUST be with you the first 24 hours after you arrive home or your procedure could be cancelled.  Informed a nurse may call a day before the procedure to confirm arrival time and ensure instructions are followed.  Patient verbalized understanding to information provided and is agreeable to proceed with procedure.   Advised to contact RN Navigator at [phone], to inform of any new medications started after call or concerns prior to procedure.

## 2024-10-08 LAB — BASIC METABOLIC PANEL WITH GFR
BUN/Creatinine Ratio: 12 (ref 10–24)
BUN: 19 mg/dL (ref 8–27)
CO2: 27 mmol/L (ref 20–29)
Calcium: 9.7 mg/dL (ref 8.6–10.2)
Chloride: 98 mmol/L (ref 96–106)
Creatinine, Ser: 1.58 mg/dL — ABNORMAL HIGH (ref 0.76–1.27)
Glucose: 162 mg/dL — ABNORMAL HIGH (ref 70–99)
Potassium: 3.7 mmol/L (ref 3.5–5.2)
Sodium: 141 mmol/L (ref 134–144)
eGFR: 45 mL/min/1.73 — ABNORMAL LOW

## 2024-10-08 LAB — CBC
Hematocrit: 51.5 % — ABNORMAL HIGH (ref 37.5–51.0)
Hemoglobin: 16.9 g/dL (ref 13.0–17.7)
MCH: 30.1 pg (ref 26.6–33.0)
MCHC: 32.8 g/dL (ref 31.5–35.7)
MCV: 92 fL (ref 79–97)
Platelets: 242 x10E3/uL (ref 150–450)
RBC: 5.61 x10E6/uL (ref 4.14–5.80)
RDW: 12.1 % (ref 11.6–15.4)
WBC: 7.9 x10E3/uL (ref 3.4–10.8)

## 2024-10-11 ENCOUNTER — Ambulatory Visit: Payer: Self-pay | Admitting: Cardiology

## 2024-11-05 ENCOUNTER — Encounter (HOSPITAL_COMMUNITY): Admission: RE | Payer: Self-pay | Source: Home / Self Care

## 2024-11-05 ENCOUNTER — Ambulatory Visit (HOSPITAL_COMMUNITY): Admission: RE | Admit: 2024-11-05 | Admitting: Cardiology

## 2024-11-05 SURGERY — ATRIAL FIBRILLATION ABLATION
Anesthesia: General

## 2024-11-10 ENCOUNTER — Ambulatory Visit

## 2024-11-25 ENCOUNTER — Ambulatory Visit: Admitting: Cardiology

## 2024-11-26 ENCOUNTER — Ambulatory Visit: Admitting: Podiatry

## 2024-12-17 ENCOUNTER — Other Ambulatory Visit

## 2024-12-23 ENCOUNTER — Ambulatory Visit: Admitting: Urology

## 2025-02-10 ENCOUNTER — Ambulatory Visit

## 2025-03-29 ENCOUNTER — Ambulatory Visit: Admitting: Physician Assistant

## 2025-09-20 ENCOUNTER — Ambulatory Visit: Payer: Self-pay
